# Patient Record
Sex: Male | Born: 1940 | Race: White | Hispanic: No | Marital: Married | Smoking: Former smoker
Health system: Southern US, Community
[De-identification: ages and names within clinical notes are randomized; demographics above are authoritative.]

## PROBLEM LIST (undated history)

## (undated) DIAGNOSIS — H409 Unspecified glaucoma: Secondary | ICD-10-CM

## (undated) DIAGNOSIS — Z974 Presence of external hearing-aid: Secondary | ICD-10-CM

## (undated) DIAGNOSIS — T4145XA Adverse effect of unspecified anesthetic, initial encounter: Secondary | ICD-10-CM

## (undated) DIAGNOSIS — N189 Chronic kidney disease, unspecified: Secondary | ICD-10-CM

## (undated) DIAGNOSIS — I10 Essential (primary) hypertension: Secondary | ICD-10-CM

## (undated) DIAGNOSIS — E785 Hyperlipidemia, unspecified: Secondary | ICD-10-CM

## (undated) DIAGNOSIS — Z973 Presence of spectacles and contact lenses: Secondary | ICD-10-CM

## (undated) DIAGNOSIS — R0602 Shortness of breath: Secondary | ICD-10-CM

## (undated) DIAGNOSIS — H269 Unspecified cataract: Secondary | ICD-10-CM

## (undated) DIAGNOSIS — R413 Other amnesia: Secondary | ICD-10-CM

## (undated) DIAGNOSIS — J449 Chronic obstructive pulmonary disease, unspecified: Secondary | ICD-10-CM

## (undated) DIAGNOSIS — Z972 Presence of dental prosthetic device (complete) (partial): Secondary | ICD-10-CM

## (undated) DIAGNOSIS — I639 Cerebral infarction, unspecified: Secondary | ICD-10-CM

## (undated) DIAGNOSIS — Z86010 Personal history of colonic polyps: Secondary | ICD-10-CM

## (undated) DIAGNOSIS — C801 Malignant (primary) neoplasm, unspecified: Secondary | ICD-10-CM

## (undated) HISTORY — PX: APPENDECTOMY: SHX54

## (undated) HISTORY — DX: Essential (primary) hypertension: I10

## (undated) HISTORY — DX: Malignant (primary) neoplasm, unspecified: C80.1

## (undated) HISTORY — DX: Hyperlipidemia, unspecified: E78.5

## (undated) HISTORY — PX: TONSILLECTOMY: SUR1361

## (undated) HISTORY — PX: COLONOSCOPY: SHX174

## (undated) HISTORY — DX: Personal history of colonic polyps: Z86.010

## (undated) HISTORY — DX: Unspecified glaucoma: H40.9

## (undated) HISTORY — DX: Unspecified cataract: H26.9

## (undated) HISTORY — PX: EYE SURGERY: SHX253

## (undated) HISTORY — DX: Chronic obstructive pulmonary disease, unspecified: J44.9

---

## 1988-05-18 HISTORY — PX: KNEE SURGERY: SHX244

## 1990-05-18 HISTORY — PX: OTHER SURGICAL HISTORY: SHX169

## 2004-09-05 ENCOUNTER — Ambulatory Visit: Payer: Self-pay | Admitting: Family Medicine

## 2004-09-10 ENCOUNTER — Ambulatory Visit: Payer: Self-pay | Admitting: Family Medicine

## 2005-01-12 ENCOUNTER — Ambulatory Visit: Payer: Self-pay | Admitting: Gastroenterology

## 2005-01-27 ENCOUNTER — Ambulatory Visit: Payer: Self-pay | Admitting: Gastroenterology

## 2005-01-27 ENCOUNTER — Encounter (INDEPENDENT_AMBULATORY_CARE_PROVIDER_SITE_OTHER): Payer: Self-pay | Admitting: Specialist

## 2009-12-04 ENCOUNTER — Encounter (INDEPENDENT_AMBULATORY_CARE_PROVIDER_SITE_OTHER): Payer: Self-pay | Admitting: *Deleted

## 2009-12-05 ENCOUNTER — Encounter (INDEPENDENT_AMBULATORY_CARE_PROVIDER_SITE_OTHER): Payer: Self-pay | Admitting: *Deleted

## 2010-01-17 ENCOUNTER — Encounter (INDEPENDENT_AMBULATORY_CARE_PROVIDER_SITE_OTHER): Payer: Self-pay

## 2010-01-21 ENCOUNTER — Ambulatory Visit: Payer: Self-pay | Admitting: Internal Medicine

## 2010-01-30 ENCOUNTER — Telehealth (INDEPENDENT_AMBULATORY_CARE_PROVIDER_SITE_OTHER): Payer: Self-pay | Admitting: *Deleted

## 2010-02-05 ENCOUNTER — Ambulatory Visit: Payer: Self-pay | Admitting: Internal Medicine

## 2010-02-11 ENCOUNTER — Encounter: Payer: Self-pay | Admitting: Internal Medicine

## 2010-04-09 ENCOUNTER — Encounter (INDEPENDENT_AMBULATORY_CARE_PROVIDER_SITE_OTHER): Payer: Self-pay | Admitting: *Deleted

## 2010-04-25 ENCOUNTER — Encounter (INDEPENDENT_AMBULATORY_CARE_PROVIDER_SITE_OTHER): Payer: Self-pay | Admitting: *Deleted

## 2010-05-01 ENCOUNTER — Ambulatory Visit: Payer: Self-pay | Admitting: Internal Medicine

## 2010-05-21 ENCOUNTER — Telehealth: Payer: Self-pay | Admitting: Internal Medicine

## 2010-05-27 ENCOUNTER — Ambulatory Visit (HOSPITAL_COMMUNITY)
Admission: RE | Admit: 2010-05-27 | Discharge: 2010-05-27 | Payer: Self-pay | Source: Home / Self Care | Attending: Internal Medicine | Admitting: Internal Medicine

## 2010-05-27 ENCOUNTER — Encounter: Payer: Self-pay | Admitting: Internal Medicine

## 2010-06-01 ENCOUNTER — Encounter: Payer: Self-pay | Admitting: Internal Medicine

## 2010-06-19 NOTE — Procedures (Signed)
Summary: Colonoscopy  Patient: [name] Note: All result statuses are Final unless otherwise noted.  Tests: (1) Colonoscopy (COL)   COL Colonoscopy           DONE     [facility]     [address]     [place], [place]  [address]           COLONOSCOPY PROCEDURE REPORT           PATIENT:  Keith Gibson, Keith Gibson  MR#:  [id number]     BIRTHDATE:  06-22-1940, 69 yrs. old  GENDER:  male     ENDOSCOPIST:  Iva Boop, MD, [facility]     REF. 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  Digital rectal exam was performed and     revealed no abnormalities and normal prostate.   The LB CF-H180AL     [id number] endoscope was introduced through the anus and advanced to     the cecum, which was identified by both the appendix and ileocecal     valve, without limitations.  The quality of the prep was     excellent, using MoviPrep.  The instrument was then slowly     withdrawn as the colon was fully examined. insertion: 2:48 minutes     Withdrawal: 21:57 minutes     <<PROCEDUREIMAGES>>           FINDINGS:  A sessile polyp was found in the ascending colon. It     was 2.5 cm in size. Just distal to cecum.Polyp was snared, then     cauterized with monopolar cautery. Retrieval was successful.     Normal saline lift was used. Piecemeal polypectomy and some tip     cautery.  Two polyps were found between the IC valve and this     large polyp. They were diminutive. They were 2  mm in size.Polyps     were snared without cautery. Retrieval was successful. This was     otherwise a normal examination of the colon. Includes right colon     retroflexion.   Retroflexed views in the rectum revealed no     abnormalities.    The scope was then withdrawn from the patient     and the procedure completed.           COMPLICATIONS:  None     ENDOSCOPIC IMPRESSION:     1) 2.5 cm sessile polyp in the ascending colon - removed with     saline lift and piecemeal polypectomy. This may be a recurrence of     polyp removed in 2005.     2) 2 mm polyps (2) also removed from ascending colon.     3) Otherwise normal examination, excellent prep     4) Prior adenoma  removal 2003 and 2005     RECOMMENDATIONS:     1) No aspirin or NSAID's for 2 weeks           REPEAT EXAM:  In 3 - 6 month(s) for Colonoscopy.  To ensure     complete removal. APC will be needed (hospital case).           Iva Boop, MD, [facility]           CC:  Burnell Blanks, MD     The Patient           n.     Rosalie Doctor:   Iva Boop at 02/05/2010 11:13 AM           Gunnar Bulla, [id number]  Note: An exclamation mark (!) indicates a result that was not dispersed into the flowsheet. Document Creation Date: 02/05/2010 11:14 AM _______________________________________________________________________  (1) Order result status: Final Collection or observation date-time: 02/05/2010 10:58 Requested date-time:  Receipt date-time:  Reported date-time:  Referring Physician:   Ordering Physician: Stan Head [id number]) Specimen Source:  Source: Launa Grill Order Number: [id number] Lab site:   Appended Document: Colonoscopy     Procedures Next Due Date:    Colonoscopy: 05/2010

## 2010-06-19 NOTE — Miscellaneous (Signed)
Summary: recall colon--ch.  Clinical Lists Changes  Medications: Added new medication of MOVIPREP 100 GM  SOLR (PEG-KCL-NACL-NASULF-NA ASC-C) As per prep instructions. - Signed Rx of MOVIPREP 100 GM  SOLR (PEG-KCL-NACL-NASULF-NA ASC-C) As per prep instructions.;  #1 x 0;  Signed;  Entered by: Clide Cliff RN;  Authorized by: Iva Boop MD, FACG;  Method used: Print then Give to Patient Observations: Added new observation of ALLERGY REV: Done (05/01/2010 8:38)    Prescriptions: MOVIPREP 100 GM  SOLR (PEG-KCL-NACL-NASULF-NA ASC-C) As per prep instructions.  #1 x 0   Entered by:   Clide Cliff RN   Authorized by:   Iva Boop MD, [facility]   Signed by:   Clide Cliff RN on 05/01/2010   Method used:   Print then Give to Patient   RxID:   [id number]

## 2010-06-19 NOTE — Procedures (Signed)
Summary: Instructions for procedure/[place]  Instructions for procedure/[place]   Imported By: Sherian Rein 05/06/2010 09:39:36  _____________________________________________________________________  External Attachment:    Type:   Image     Comment:   External Document

## 2010-06-19 NOTE — Letter (Signed)
Summary: Patient Notice- Polyp Results  [facility]  [address] [place], [place] [address]   Phone: [phone]  Fax: [phone]        February 11, 2010 MRN: [id number]    Keith Gibson [address] [place], [place]  [address]    Dear Keith Gibson,  The polyps removed from your colon were adenomatous. This means that they were pre-cancerous or that  they had the potential to change into cancer over time.  In order to make sure that them large polyp was completely removed, I recommend you have a repeat colonoscopy in about 3 months. If you are not contacted by [place] at that time, please call us.  Please call us if you are having persistent problems or have questions about your condition that have not been fully answered at this time.   Sincerely,  Iva Boop MD, [facility]  This letter has been electronically signed by your physician.  Appended Document: Patient Notice- Polyp Results letter mailed

## 2010-06-19 NOTE — Procedures (Signed)
Summary: Colonoscopy  Patient: [name] Note: All result statuses are Final unless otherwise noted.  Tests: (1) Colonoscopy (COL)   COL Colonoscopy           DONE     [facility]     [address] [place], [place]  [address]           COLONOSCOPY PROCEDURE REPORT           PATIENT:  Keith, Gibson  MR#:  [id number]     BIRTHDATE:  24-Jan-1941, 69 yrs. old  GENDER:  male     ENDOSCOPIST:  Iva Boop, MD, [facility]           PROCEDURE DATE:  05/27/2010     PROCEDURE:  Colonoscopy with snare polypectomy, Colonoscopy with     tumor ablation     ASA CLASS:  Class II     INDICATIONS:  excision of known colonic polyps (2.5 cm ascending     tubulovillous adenoma removed 9/11, ? completely removed)     MEDICATIONS:   Fentanyl 50 mcg IV, Versed 5 mg           DESCRIPTION OF PROCEDURE:   After the risks benefits and     alternatives of the procedure were thoroughly explained, informed     consent was obtained.  Digital rectal exam was performed and     revealed no abnormalities and normal prostate.   The  endoscope     was introduced through the anus and advanced to the cecum, which     was identified by both the appendix and ileocecal valve, without     limitations.  The quality of the prep was excellent, using     MoviPrep.  The instrument was then slowly withdrawn as the colon     was fully examined. Withdrawal time was 21 minutes     <<PROCEDUREIMAGES>>           FINDINGS:  A sessile polyp was found in the ascending colon.     Residual polyp up to 1 cm long and 6-7 mm wide. Snared piecemeal     and biopsy forceps also used. Then ablated area with argon plasma     coagulator. Polyp was snared, then cauterized with monopolar     cautery. Retrieval was successful. snare polyp.  This was     otherwise a normal examination of the colon.   Retroflexed views     in the rectum revealed no abnormalities.    The scope was then     withdrawn from the patient and the procedure  completed.           COMPLICATIONS:  None     ENDOSCOPIC IMPRESSION:     1) Sessile polyp in the ascending colon - residual polyp.     Removed and site ablated with APC today.     2) Otherwise normal examination     3) Prior adenoma removal 2003 and 2005     RECOMMENDATIONS:     1) Hold aspirin, aspirin products, and anti-inflammatory     medication for 2 weeks.     REPEAT EXAM:  In for Colonoscopy, pending biopsy results. Likely     repeat in 1 year.           Iva Boop, MD, [facility]           CC:  The Patient and Burnell Blanks, MD  n.     eSIGNED:   Iva Boop at 05/27/2010 09:40 AM           Gunnar Bulla, [id number]  Note: An exclamation mark (!) indicates a result that was not dispersed into the flowsheet. Document Creation Date: 05/27/2010 9:41 AM _______________________________________________________________________  (1) Order result status: Final Collection or observation date-time: 05/27/2010 09:22 Requested date-time:  Receipt date-time:  Reported date-time:  Referring Physician:   Ordering Physician: Stan Head [id number]) Specimen Source:  Source: Launa Grill Order Number: [id number] Lab site:

## 2010-06-19 NOTE — Letter (Signed)
Summary: [facility] Instructions  [facility]  [address] [place], [place] [address]   Phone: [phone]  Fax: [phone]       Keith Gibson    March 27, 1941    MRN: [id number]        Procedure Day /Date:  05/27/10  Tuesday     Arrival Time:  8:00am     Procedure Time:  9:00am     Location of Procedure:                    _ X_ [facility]                      PREPARATION FOR COLONOSCOPY WITH MOVIPREP   Starting 5 days prior to your procedure  05/22/10 do not eat nuts, seeds, popcorn, corn, beans, peas,  salads, or any raw vegetables.  Do not take any fiber supplements (e.g. Metamucil, Citrucel, and Benefiber).  THE DAY BEFORE YOUR PROCEDURE         DATE: 05/26/10   Monday  1.  Drink clear liquids the entire day-NO SOLID FOOD  2.  Do not drink anything colored red or purple.  Avoid juices with pulp.  No orange juice.  3.  Drink at least 64 oz. (8 glasses) of fluid/clear liquids during the day to prevent dehydration and help the prep work efficiently.  CLEAR LIQUIDS INCLUDE: Water Jello Ice Popsicles Tea (sugar ok, no milk/cream) Powdered fruit flavored drinks Coffee (sugar ok, no milk/cream) Gatorade Juice: apple, white grape, white cranberry  Lemonade Clear bullion, consomm, broth Carbonated beverages (any kind) Strained chicken noodle soup Hard Candy                             4.  In the morning, mix first dose of MoviPrep solution:    Empty 1 Pouch A and 1 Pouch B into the disposable container    Add lukewarm drinking water to the top line of the container. Mix to dissolve    Refrigerate (mixed solution should be used within 24 hrs)  5.  Begin drinking the prep at 5:00 p.m. The MoviPrep container is divided by 4 marks.   Every 15 minutes drink the solution down to the next mark (approximately 8 oz) until the full liter is complete.   6.  Follow completed prep with 16 oz of clear liquid of your choice (Nothing red or purple).  Continue  to drink clear liquids until bedtime.  7.  Before going to bed, mix second dose of MoviPrep solution:    Empty 1 Pouch A and 1 Pouch B into the disposable container    Add lukewarm drinking water to the top line of the container. Mix to dissolve    Refrigerate  THE DAY OF YOUR PROCEDURE      DATE: 05/27/10   DAY: Monday  Beginning at  4:00am  (5 hours before procedure):         1. Every 15 minutes, drink the solution down to the next mark (approx 8 oz) until the full liter is complete.  2. Follow completed prep with 16 oz. of clear liquid of your choice.    3. You may drink clear liquids until  Midnight the night before.   MEDICATION INSTRUCTIONS  Unless otherwise instructed, you should take regular prescription medications with a small sip of water   as early as possible the morning of  your procedure.           OTHER INSTRUCTIONS  You will need a responsible adult at least 71 years of age to accompany you and drive you home.   This person must remain in the waiting room during your procedure.  Wear loose fitting clothing that is easily removed.  Leave jewelry and other valuables at home.  However, you may wish to bring a book to read or  an iPod/MP3 player to listen to music as you wait for your procedure to start.  Remove all body piercing jewelry and leave at home.  Total time from sign-in until discharge is approximately 2-3 hours.  You should go home directly after your procedure and rest.  You can resume normal activities the  day after your procedure.  The day of your procedure you should not:   Drive   Make legal decisions   Operate machinery   Drink alcohol   Return to work  You will receive specific instructions about eating, activities and medications before you leave.    The above instructions have been reviewed and explained to me by   Clide Cliff, RN______________________    I fully understand and can verbalize these instructions  _____________________________ Date _________

## 2010-06-19 NOTE — Letter (Signed)
Summary: [facility] Instructions  [facility]  [address] [place], [place] [address]   Phone: [phone]  Fax: [phone]       CORRIGAN KRETSCHMER    1941-03-25    MRN: [id number]        Procedure Day Dorna Bloom:  Wednesday 02/05/2010     Arrival Time: 9:00 am      Procedure Time: 10:00 am     Location of Procedure:                    _x _  [facility] (4th Floor)                        PREPARATION FOR COLONOSCOPY WITH MOVIPREP   Starting 5 days prior to your procedure Friday 9/16 do not eat nuts, seeds, popcorn, corn, beans, peas,  salads, or any raw vegetables.  Do not take any fiber supplements (e.g. Metamucil, Citrucel, and Benefiber).  THE DAY BEFORE YOUR PROCEDURE         DATE: Tuesday 9/20 1.  Drink clear liquids the entire day-NO SOLID FOOD  2.  Do not drink anything colored red or purple.  Avoid juices with pulp.  No orange juice.  3.  Drink at least 64 oz. (8 glasses) of fluid/clear liquids during the day to prevent dehydration and help the prep work efficiently.  CLEAR LIQUIDS INCLUDE: Water Jello Ice Popsicles Tea (sugar ok, no milk/cream) Powdered fruit flavored drinks Coffee (sugar ok, no milk/cream) Gatorade Juice: apple, white grape, white cranberry  Lemonade Clear bullion, consomm, broth Carbonated beverages (any kind) Strained chicken noodle soup Hard Candy                             4.  In the morning, mix first dose of MoviPrep solution:    Empty 1 Pouch A and 1 Pouch B into the disposable container    Add lukewarm drinking water to the top line of the container. Mix to dissolve    Refrigerate (mixed solution should be used within 24 hrs)  5.  Begin drinking the prep at 5:00 p.m. The MoviPrep container is divided by 4 marks.   Every 15 minutes drink the solution down to the next mark (approximately 8 oz) until the full liter is complete.   6.  Follow completed prep with 16 oz of clear liquid of your choice (Nothing  red or purple).  Continue to drink clear liquids until bedtime.  7.  Before going to bed, mix second dose of MoviPrep solution:    Empty 1 Pouch A and 1 Pouch B into the disposable container    Add lukewarm drinking water to the top line of the container. Mix to dissolve    Refrigerate  THE DAY OF YOUR PROCEDURE      DATE: Wednesday 9/21  Beginning at 5:00 a.m. (5 hours before procedure):         1. Every 15 minutes, drink the solution down to the next mark (approx 8 oz) until the full liter is complete.  2. Follow completed prep with 16 oz. of clear liquid of your choice.    3. You may drink clear liquids until 8:00 am (2 HOURS BEFORE PROCEDURE).   MEDICATION INSTRUCTIONS  Unless otherwise instructed, you should take regular prescription medications with a small sip of water   as early as possible the morning of your  procedure.         OTHER INSTRUCTIONS  You will need a responsible adult at least 71 years of age to accompany you and drive you home.   This person must remain in the waiting room during your procedure.  Wear loose fitting clothing that is easily removed.  Leave jewelry and other valuables at home.  However, you may wish to bring a book to read or  an iPod/MP3 player to listen to music as you wait for your procedure to start.  Remove all body piercing jewelry and leave at home.  Total time from sign-in until discharge is approximately 2-3 hours.  You should go home directly after your procedure and rest.  You can resume normal activities the  day after your procedure.  The day of your procedure you should not:   Drive   Make legal decisions   Operate machinery   Drink alcohol   Return to work  You will receive specific instructions about eating, activities and medications before you leave.    The above instructions have been reviewed and explained to me by   Ulis Rias RN  January 21, 2010 10:13 AM     I fully understand and can  verbalize these instructions _____________________________ Date _________

## 2010-06-19 NOTE — Letter (Signed)
Summary: Colonoscopy Letter  [facility]  [address] [place], [place] [address]   Phone: [phone]  Fax: [phone]      December 04, 2009 MRN: [id number]   Keith Gibson [address] [place], [place]  [address]   Dear [name],   According to your medical record, it is time for you to schedule a Colonoscopy. The American Cancer Society recommends this procedure as a method to detect early colon cancer. Patients with a family history of colon cancer, or a personal history of colon polyps or inflammatory bowel disease are at increased risk.  This letter has beeen generated based on the recommendations made at the time of your procedure. If you feel that in your particular situation this may no longer apply, please contact our office.  Please call our office at [phone] to schedule this appointment or to update your records at your earliest convenience.  Thank you for cooperating with [place] to provide you with the very best care possible.   Sincerely,   Iva Boop, M.D.  [facility] [phone]

## 2010-06-19 NOTE — Progress Notes (Signed)
Summary: Resch'd Procedure  Phone Note Call from Patient Call back at Home Phone [phone]   Caller: Patient Call For: Dr. Leone Payor Summary of Call: pt. has a procedure sch'd at the hosp. 05-27-10 and he has the shingles. Needs to r/s his appt. Initial call taken by: Karna Christmas,  May 21, 2010 8:07 AM  Follow-up for Phone Call        Patient has a mild case of shingles.  He is not having any pain.  He is currently taking acyclovir three times a day. he would like to continue with the procedure. Is this ok? Follow-up by: Darcey Nora RN, CGRN,  May 21, 2010 9:07 AM  Additional Follow-up for Phone Call Additional follow up Details #1::        yes

## 2010-06-19 NOTE — Miscellaneous (Signed)
Summary: Lec previsit  Clinical Lists Changes  Medications: Added new medication of MOVIPREP 100 GM  SOLR (PEG-KCL-NACL-NASULF-NA ASC-C) As per prep instructions. - Signed Added new medication of MOVIPREP 100 GM  SOLR (PEG-KCL-NACL-NASULF-NA ASC-C) As per prep instructions. - Signed Rx of MOVIPREP 100 GM  SOLR (PEG-KCL-NACL-NASULF-NA ASC-C) As per prep instructions.;  #1 x 0;  Signed;  Entered by: Ulis Rias RN;  Authorized by: Iva Boop MD, FACG;  Method used: Print then Give to Patient Rx of MOVIPREP 100 GM  SOLR (PEG-KCL-NACL-NASULF-NA ASC-C) As per prep instructions.;  #1 x 0;  Signed;  Entered by: Ulis Rias RN;  Authorized by: Iva Boop MD, FACG;  Method used: Print then Give to Patient Observations: Added new observation of NKA: T (01/21/2010 9:37)    Prescriptions: MOVIPREP 100 GM  SOLR (PEG-KCL-NACL-NASULF-NA ASC-C) As per prep instructions.  #1 x 0   Entered by:   Ulis Rias RN   Authorized by:   Iva Boop MD, [place]   Signed by:   Ulis Rias RN on 01/21/2010   Method used:   Print then Give to Patient   RxID:   [id number] MOVIPREP 100 GM  SOLR (PEG-KCL-NACL-NASULF-NA ASC-C) As per prep instructions.  #1 x 0   Entered by:   Ulis Rias RN   Authorized by:   Iva Boop MD, [facility]   Signed by:   Ulis Rias RN on 01/21/2010   Method used:   Print then Give to Patient   RxID:   [id number]

## 2010-06-19 NOTE — Letter (Signed)
Summary: Pre Visit Letter Revised  [facility]  [address], [place] [address]   Phone: [phone]  Fax: [phone]        04/09/2010 MRN: [id number] Keith Gibson [address] [place], [place]  [address]             Procedure Date:  05-15-10   Welcome to the Gastroenterology Division at [facility].    You are scheduled to see a nurse for your pre-procedure visit on 05-01-10 at 9:00a.m. on the 3rd floor at [facility], [address]  We ask that you try to arrive at our office 15 minutes prior to your appointment time to allow for check-in.  Please take a minute to review the attached form.  If you answer "Yes" to one or more of the questions on the first page, we ask that you call the person listed at your earliest opportunity.  If you answer "No" to all of the questions, please complete the rest of the form and bring it to your appointment.    Your nurse visit will consist of discussing your medical and surgical history, your immediate family medical history, and your medications.   If you are unable to list all of your medications on the form, please bring the medication bottles to your appointment and we will list them.  We will need to be aware of both prescribed and over the counter drugs.  We will need to know exact dosage information as well.    Please be prepared to read and sign documents such as consent forms, a financial agreement, and acknowledgement forms.  If necessary, and with your consent, a friend or relative is welcome to sit-in on the nurse visit with you.  Please bring your insurance card so that we may make a copy of it.  If your insurance requires a referral to see a specialist, please bring your referral form from your primary care physician.  No co-pay is required for this nurse visit.     If you cannot keep your appointment, please call [phone] to cancel or reschedule prior to your appointment date.  This allows [place] the  opportunity to schedule an appointment for another patient in need of care.    Thank you for choosing  Gastroenterology for your medical needs.  We appreciate the opportunity to care for you.  Please visit [place] at our website  to learn more about our practice.  Sincerely, The Gastroenterology Division

## 2010-06-19 NOTE — Letter (Signed)
Summary: Previsit letter  [facility]  [address] [place], [place] [address]   Phone: [phone]  Fax: [phone]       12/05/2009 MRN: [id number]  Keith Gibson [address] [place], [place]  [address]  Dear Keith Gibson,  Welcome to the Gastroenterology Division at [facility].    You are scheduled to see a nurse for your pre-procedure visit on 01-21-10 at 10:00a.m. on the 3rd floor at [facility], [address]  We ask that you try to arrive at our office 15 minutes prior to your appointment time to allow for check-in.  Your nurse visit will consist of discussing your medical and surgical history, your immediate family medical history, and your medications.    Please bring a complete list of all your medications or, if you prefer, bring the medication bottles and we will list them.  We will need to be aware of both prescribed and over the counter drugs.  We will need to know exact dosage information as well.  If you are on blood thinners (Coumadin, Plavix, Aggrenox, Ticlid, etc.) please call our office today/prior to your appointment, as we need to consult with your physician about holding your medication.   Please be prepared to read and sign documents such as consent forms, a financial agreement, and acknowledgement forms.  If necessary, and with your consent, a friend or relative is welcome to sit-in on the nurse visit with you.  Please bring your insurance card so that we may make a copy of it.  If your insurance requires a referral to see a specialist, please bring your referral form from your primary care physician.  No co-pay is required for this nurse visit.     If you cannot keep your appointment, please call [phone] to cancel or reschedule prior to your appointment date.  This allows [place] the opportunity to schedule an appointment for another patient in need of care.    Thank you for choosing [facility] for your medical needs.  We  appreciate the opportunity to care for you.  Please visit [place] at our website  to learn more about our practice.                     Sincerely.                                                                                                                   The Gastroenterology Division

## 2010-06-19 NOTE — Letter (Signed)
Summary: Patient Notice- Polyp Results  [facility]  [address] [place], [place] [address]   Phone: [phone]  Fax: [phone]        June 01, 2010 MRN: [id number]    Keith Gibson [address] [place], [place]  [address]    Dear [name],  I am pleased to inform you that the colon polyp removed during your recent colonoscopy was found to be benign (no cancer detected) upon pathologic examination.  I recommend you have a repeat colonoscopy examination in 1 years to look for recurrent polyps, as having colon polyps increases your risk for having recurrent polyps or even colon cancer in the future.  Should you develop new or worsening symptoms of abdominal pain, bowel habit changes or bleeding from the rectum or bowels, please schedule an evaluation with either your primary care physician or with me.  Please call us if you are having persistent problems or have questions about your condition that have not been fully answered at this time.  Sincerely,  Iva Boop MD, [facility]  This letter has been electronically signed by your physician.  Appended Document: Patient Notice- Polyp Results letter mailed to patient's home

## 2010-06-19 NOTE — Progress Notes (Signed)
Summary: speak to nurse  Phone Note Call from Patient Call back at Home Phone [phone]   Caller: Patient Call For: Dr Leone Payor Reason for Call: Talk to Nurse Summary of Call: Patient wants to speak to nurse Initial call taken by: Tawni Levy,  January 30, 2010 1:11 PM  Follow-up for Phone Call        Pt's questions about diet were answered. Follow-up by: Ezra Sites RN,  January 30, 2010 2:03 PM

## 2012-02-04 ENCOUNTER — Encounter: Payer: Self-pay | Admitting: Internal Medicine

## 2012-02-08 ENCOUNTER — Encounter: Payer: Self-pay | Admitting: Internal Medicine

## 2012-03-08 ENCOUNTER — Ambulatory Visit (AMBULATORY_SURGERY_CENTER): Payer: Medicare Other

## 2012-03-08 VITALS — Ht 67.0 in | Wt 171.0 lb

## 2012-03-08 DIAGNOSIS — Z86010 Personal history of colonic polyps: Secondary | ICD-10-CM

## 2012-03-08 DIAGNOSIS — Z1211 Encounter for screening for malignant neoplasm of colon: Secondary | ICD-10-CM

## 2012-03-08 MED ORDER — NA SULFATE-K SULFATE-MG SULF 17.5-3.13-1.6 GM/177ML PO SOLN: 1.0000 | Freq: Once | ORAL | Status: DC

## 2012-03-22 ENCOUNTER — Encounter: Payer: Self-pay | Admitting: Internal Medicine

## 2012-03-22 ENCOUNTER — Ambulatory Visit (AMBULATORY_SURGERY_CENTER): Payer: Medicare Other | Admitting: Internal Medicine

## 2012-03-22 VITALS — BP 142/87 | HR 65 | Temp 97.6°F | Resp 19 | Ht 67.0 in | Wt 171.0 lb

## 2012-03-22 DIAGNOSIS — Z860100 Personal history of colon polyps, unspecified: Secondary | ICD-10-CM

## 2012-03-22 DIAGNOSIS — D126 Benign neoplasm of colon, unspecified: Secondary | ICD-10-CM

## 2012-03-22 DIAGNOSIS — Z1211 Encounter for screening for malignant neoplasm of colon: Secondary | ICD-10-CM

## 2012-03-22 HISTORY — DX: Personal history of colonic polyps: Z86.010

## 2012-03-22 HISTORY — DX: Personal history of colon polyps, unspecified: Z86.0100

## 2012-03-22 MED ORDER — SODIUM CHLORIDE 0.9 % IV SOLN: 500.0000 mL | INTRAVENOUS | Status: DC

## 2012-03-22 NOTE — Op Note (Signed)
[facility] [address] [place], [address]   COLONOSCOPY PROCEDURE REPORT  PATIENT: Keith, Gibson  MR#: [id number] BIRTHDATE: [birth year] , 71  yrs. old GENDER: Male ENDOSCOPIST: Iva Boop, MD, [facility] PROCEDURE DATE:  03/22/2012 PROCEDURE:   Colonoscopy with biopsy ASA CLASS:   Class III INDICATIONS:patient's personal history of adenomatous colon polyps.  MEDICATIONS: Propofol (Diprivan) 270 mg IV, MAC sedation, administered by CRNA, and These medications were titrated to patient response per physician's verbal order  DESCRIPTION OF PROCEDURE:   After the risks benefits and alternatives of the procedure were thoroughly explained, informed consent was obtained.  A digital rectal exam revealed no abnormalities of the rectum and A digital rectal exam revealed the prostate was not enlarged.   The LB [id number]-[id number]  endoscope was introduced through the anus and advanced to the cecum, which was identified by both the appendix and ileocecal valve. No adverse events experienced.   The quality of the prep was Suprep excellent The instrument was then slowly withdrawn as the colon was fully examined.      COLON FINDINGS: A diminutive polypoid shaped sessile polyp was found in the ascending colon.  A polypectomy was performed with cold forceps.  The resection was complete and the polyp tissue was completely retrieved.   Small internal hemorrhoids were found. The colon mucosa was otherwise normal.  Retroflexed views revealed internal hemorrhoids. The time to cecum=2 minutes 08 seconds. Withdrawal time=11 minutes 45 seconds.  The scope was withdrawn and the procedure completed. COMPLICATIONS: There were no complications.  ENDOSCOPIC IMPRESSION: 1.   Diminutive sessile polyp was found in the ascending colon; polypectomy was performed with cold forceps 2.   Small internal hemorrhoids 3.   The colon mucosa was otherwise normal - excellent prep 4.    Personal  hx adenomas since 2003, large TV adenoma ascending colon last removed 05/2010 appears gone RECOMMENDATIONS: Timing of repeat colonoscopy will be determined by pathology findings.   eSigned:  Iva Boop, MD, [facility] 03/22/2012 12:41 PM   cc: Burnell Blanks, MD and The Patient

## 2012-03-22 NOTE — Progress Notes (Signed)
Pt brought inhaler to center. One puff from Spiriva inhaler administered by S. Camp, Scientist, clinical (histocompatibility and immunogenetics)

## 2012-03-22 NOTE — Progress Notes (Addendum)
Patient did not have preoperative order for IV antibiotic SSI prophylaxis. (G8918)  Patient did not experience any of the following events: a burn prior to discharge; a fall within the facility; wrong site/side/patient/procedure/implant event; or a hospital transfer or hospital admission upon discharge from the facility. (G8907)  

## 2012-03-22 NOTE — Patient Instructions (Addendum)
One tiny polyp was removed. The large polyp appears to be gone. I will let you know the results and recommendations by mail.  Thank you for choosing me and [facility].  Iva Boop, MD, FACG  YOU HAD AN ENDOSCOPIC PROCEDURE TODAY AT THE [facility]: Refer to the procedure report that was given to you for any specific questions about what was found during the examination.  If the procedure report does not answer your questions, please call your gastroenterologist to clarify.  If you requested that your care partner not be given the details of your procedure findings, then the procedure report has been included in a sealed envelope for you to review at your convenience later.  YOU SHOULD EXPECT: Some feelings of bloating in the abdomen. Passage of more gas than usual.  Walking can help get rid of the air that was put into your GI tract during the procedure and reduce the bloating. If you had a lower endoscopy (such as a colonoscopy or flexible sigmoidoscopy) you may notice spotting of blood in your stool or on the toilet paper. If you underwent a bowel prep for your procedure, then you may not have a normal bowel movement for a few days.  DIET: Your first meal following the procedure should be a light meal and then it is ok to progress to your normal diet.  A half-sandwich or bowl of soup is an example of a good first meal.  Heavy or fried foods are harder to digest and may make you feel nauseous or bloated.  Likewise meals heavy in dairy and vegetables can cause extra gas to form and this can also increase the bloating.  Drink plenty of fluids but you should avoid alcoholic beverages for 24 hours.  ACTIVITY: Your care partner should take you home directly after the procedure.  You should plan to take it easy, moving slowly for the rest of the day.  You can resume normal activity the day after the procedure however you should NOT DRIVE or use heavy machinery for 24 hours  (because of the sedation medicines used during the test).    SYMPTOMS TO REPORT IMMEDIATELY: A gastroenterologist can be reached at any hour.  During normal business hours, 8:30 AM to 5:00 PM Monday through Friday, call [phone].  After hours and on weekends, please call the GI answering service at [phone] who will take a message and have the physician on call contact you.   Following lower endoscopy (colonoscopy or flexible sigmoidoscopy):  Excessive amounts of blood in the stool  Significant tenderness or worsening of abdominal pains  Swelling of the abdomen that is new, acute  Fever of 100F or higher     FOLLOW UP: If any biopsies were taken you will be contacted by phone or by letter within the next 1-3 weeks.  Call your gastroenterologist if you have not heard about the biopsies in 3 weeks.  Our staff will call the home number listed on your records the next business day following your procedure to check on you and address any questions or concerns that you may have at that time regarding the information given to you following your procedure. This is a courtesy call and so if there is no answer at the home number and we have not heard from you through the emergency physician on call, we will assume that you have returned to your regular daily activities without incident.  SIGNATURES/CONFIDENTIALITY: You and/or your care partner have  signed paperwork which will be entered into your electronic medical record.  These signatures attest to the fact that that the information above on your After Visit Summary has been reviewed and is understood.  Full responsibility of the confidentiality of this discharge information lies with you and/or your care-partner. e    Polyp information given.

## 2012-03-23 ENCOUNTER — Telehealth: Payer: Self-pay | Admitting: *Deleted

## 2012-03-23 NOTE — Telephone Encounter (Signed)
  Follow up Call-  Call back number 03/22/2012  Post procedure Call Back phone  # [phone]  Permission to leave phone message Yes     Patient questions:  Do you have a fever, pain , or abdominal swelling? no Pain Score  0 *  Have you tolerated food without any problems? yes  Have you been able to return to your normal activities? yes  Do you have any questions about your discharge instructions: Diet   no Medications  no Follow up visit  no  Do you have questions or concerns about your Care? no  Actions: * If pain score is 4 or above: No action needed, pain <4.

## 2012-03-28 ENCOUNTER — Encounter: Payer: Self-pay | Admitting: Internal Medicine

## 2012-03-28 NOTE — Progress Notes (Signed)
Quick Note:  Diminutive adenoma Repeat colonoscopy 2018 ______

## 2012-08-25 ENCOUNTER — Encounter (HOSPITAL_BASED_OUTPATIENT_CLINIC_OR_DEPARTMENT_OTHER): Payer: Self-pay | Admitting: *Deleted

## 2012-08-25 NOTE — Progress Notes (Signed)
States mild copd-used to use spiriva-did not help-no problems now-no inhalers-has never seen cardiology or resp MD To come for ekg and bmet To bring all meds and overnight bag in case he needs to stay overnight

## 2012-08-25 NOTE — Progress Notes (Signed)
08/25/12 1109  OBSTRUCTIVE SLEEP APNEA  Have you ever been diagnosed with sleep apnea through a sleep study? No  Do you snore loudly (loud enough to be heard through closed doors)?  1  Do you often feel tired, fatigued, or sleepy during the daytime? 0  Has anyone observed you stop breathing during your sleep? 0  Do you have, or are you being treated for high blood pressure? 1  BMI more than 35 kg/m2? 0  Age over 72 years old? 1  Gender: 1  Obstructive Sleep Apnea Score 4  Score 4 or greater  Results sent to PCP

## 2012-08-26 ENCOUNTER — Encounter (HOSPITAL_BASED_OUTPATIENT_CLINIC_OR_DEPARTMENT_OTHER)
Admission: RE | Admit: 2012-08-26 | Discharge: 2012-08-26 | Disposition: A | Discharge status: Home / Self Care | Payer: Medicare Other | Source: Ambulatory Visit | Attending: Orthopedic Surgery | Admitting: Orthopedic Surgery

## 2012-08-26 ENCOUNTER — Other Ambulatory Visit: Payer: Self-pay | Admitting: Orthopedic Surgery

## 2012-08-26 LAB — BASIC METABOLIC PANEL
BUN: 17 mg/dL (ref 6–23)
Chloride: 101 mEq/L (ref 96–112)
GFR calc Af Amer: 60 mL/min — ABNORMAL LOW (ref >90)
GFR calc non Af Amer: 52 mL/min — ABNORMAL LOW (ref >90)
Glucose, Bld: 92 mg/dL (ref 70–99)
Potassium: 5.2 mEq/L — ABNORMAL HIGH (ref 3.5–5.1)
Sodium: 136 mEq/L (ref 135–145)

## 2012-08-26 NOTE — Pre-Procedure Instructions (Signed)
Dr. Chaney Malling informed of K+ of 5.2 - will repeat DOS

## 2012-08-29 NOTE — H&P (Signed)
Keith Gibson is an 72 y.o. male.   Chief Complaint: c/o chronic and progressive right shoulder pain HPI: Keith Gibson is a 72 year-old retired gentleman who remains very active at home.  He is accompanied by his wife during our consult.  He sustained a significant injury to his right shoulder in an injury episode that occurred on his front porch in August of 2013.  He had pain, difficulty with range of motion, weakness and challenges with overhead activity or reaching behind his back.  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  He has age appropriate changes on his x-rays and degenerative change at the [facility] joint.  Keith Gibson ordered a MRI of the shoulder that was accomplished on 12/22/11.  This was interpreted by the Keith Gibson to reveal partial tearing of the biceps tendon at the bicipital groove, tenosynovitis, interarticular tendinosis of the biceps tendon, and supraspinatus tendinosis with a 5 mm.  intrasubstance PASTA tear, subscapularis tendonitis with fraying of its insertion, AC degenerative arthritis and degenerative tearing of the superior labrum.  Keith Gibson provided a steroid injection and recommended exercise.  Overall, Keith Gibson has improved, but continues to have weakness of abduction, external rotation, difficulty reaching behind his back and episodic pain when he tries to sleep on the shoulder at night.  He seeks an upper extremity orthopaedic consult for second opinion and possible treatment.    Past Medical History  Diagnosis Date  . Hyperlipidemia   . Hypertension   . COPD (chronic obstructive pulmonary disease)   . 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 Personal history of adenomatous  colonic polyps 03/22/2012    Adenomas 2003 and 2005 01/2010 2.5 cm TV adenoma of cecum removed 05/2010 - residual TV adenoma removal   . Shortness of breath   . Wears glasses   .  Wears dentures     full top-partial bottom  . Wears hearing aid     both ears    Past Surgical History  Procedure Laterality Date  . Knee surgery  1990    arthroscopic on right knee  . Colonoscopy    . Vocal cord surg  1992    removed precancerous polyps  . Tonsillectomy    . Appendectomy    . Eye surgery      lazer    History reviewed. No pertinent family history. Social History:  reports that he quit smoking about 15 years ago. His smoking use included Cigarettes. He smoked 0.00 packs per day. He has never used smokeless tobacco. He reports that he drinks about 8.4 ounces of alcohol per week. His drug history is not on file.  Allergies: No Known Allergies  No prescriptions prior to admission    No results found for this or any previous visit (from the past 48 hour(s)).  No results found.   Pertinent items are noted in HPI.  Height 5\' 6"  (1.676 m), weight 81.647 kg (180 lb).  General appearance: alert Head: Normocephalic, without obvious abnormality Neck: supple, symmetrical, trachea midline Resp: clear to auscultation bilaterally Cardio: regular rate and rhythm GI: normal findings: bowel sounds normal Extremities:. His shoulder range of motion reveals combined elevation right shoulder 165, left shoulder 170, external rotation 90 degrees abduction right shoulder 80, left shoulder 80, internal rotation right shoulder L-1, left shoulder T-10.  He lacks extension to the interscapular plane 20 degrees  bilaterally.  He has a painful push-off test with weakness on the right, negative push-off test on the left. He has a markedly painful Speed's test on the right, mild on the left.  He has weakness of scaption abduction.   His plain films of the shoulder AP internal and external rotation views and outlet view are reviewed.  He has AC degenerative change with widening of the [facility] joint.  He has a prominent anterior medial acromion and anterolateral acromion.  He has a well preserved  glenohumeral joint space.   His MRI is reviewed in detail. He has a PASTA tear that is retracted about 5-6 mm., he has subscapularis tendinopathy and biceps tendinopathy.   Pulses: 2+ and symmetric Skin: normal Neurologic: Grossly normal    Assessment/Plan Impression:Right shoulder RC tear.   Plan:to OR for right SA with SAD/DCR with RC repair.We had a detailed discussion regarding his predicament.  He has a high mileage 72 year-old shoulder.  As he wants to pursue an active lifestyle he is a proper candidate to consider arthroscopic evaluation, SAD/DCR and repair of the rotator cuff.    Keith Gibson 08/29/2012, 4:27 PM   H&P documentation: 08/30/2012  -History and Physical Reviewed  -Patient has been re-examined  -No change in the plan of care  Keith Forster, MD

## 2012-08-30 ENCOUNTER — Ambulatory Visit (HOSPITAL_BASED_OUTPATIENT_CLINIC_OR_DEPARTMENT_OTHER): Payer: Medicare Other | Admitting: Anesthesiology

## 2012-08-30 ENCOUNTER — Encounter (HOSPITAL_BASED_OUTPATIENT_CLINIC_OR_DEPARTMENT_OTHER)
Admission: RE | Disposition: A | Discharge status: Home / Self Care | Payer: Self-pay | Source: Ambulatory Visit | Attending: Orthopedic Surgery

## 2012-08-30 ENCOUNTER — Ambulatory Visit (HOSPITAL_BASED_OUTPATIENT_CLINIC_OR_DEPARTMENT_OTHER)
Admission: RE | Admit: 2012-08-30 | Discharge: 2012-08-31 | Disposition: A | Discharge status: Home / Self Care | Payer: Medicare Other | Source: Ambulatory Visit | Attending: Orthopedic Surgery | Admitting: Orthopedic Surgery

## 2012-08-30 ENCOUNTER — Encounter (HOSPITAL_BASED_OUTPATIENT_CLINIC_OR_DEPARTMENT_OTHER): Payer: Self-pay | Admitting: Anesthesiology

## 2012-08-30 ENCOUNTER — Encounter (HOSPITAL_BASED_OUTPATIENT_CLINIC_OR_DEPARTMENT_OTHER): Payer: Self-pay | Admitting: *Deleted

## 2012-08-30 DIAGNOSIS — S43429A Sprain of unspecified rotator cuff capsule, initial encounter: Secondary | ICD-10-CM | POA: Insufficient documentation

## 2012-08-30 DIAGNOSIS — M25819 Other specified joint disorders, unspecified shoulder: Secondary | ICD-10-CM | POA: Insufficient documentation

## 2012-08-30 DIAGNOSIS — M19019 Primary osteoarthritis, unspecified shoulder: Secondary | ICD-10-CM | POA: Insufficient documentation

## 2012-08-30 DIAGNOSIS — X58XXXA Exposure to other specified factors, initial encounter: Secondary | ICD-10-CM | POA: Insufficient documentation

## 2012-08-30 DIAGNOSIS — Z87891 Personal history of nicotine dependence: Secondary | ICD-10-CM | POA: Insufficient documentation

## 2012-08-30 HISTORY — DX: Presence of dental prosthetic device (complete) (partial): Z97.2

## 2012-08-30 HISTORY — DX: Shortness of breath: R06.02

## 2012-08-30 HISTORY — DX: Presence of external hearing-aid: Z97.4

## 2012-08-30 HISTORY — PX: SHOULDER ARTHROSCOPY WITH ROTATOR CUFF REPAIR AND SUBACROMIAL DECOMPRESSION: SHX5686

## 2012-08-30 HISTORY — DX: Presence of spectacles and contact lenses: Z97.3

## 2012-08-30 LAB — POCT I-STAT, CHEM 8
BUN: 12 mg/dL (ref 6–23)
Creatinine, Ser: 1.1 mg/dL (ref 0.50–1.35)
Glucose, Bld: 88 mg/dL (ref 70–99)
Hemoglobin: 18.4 g/dL — ABNORMAL HIGH (ref 13.0–17.0)
Potassium: 4.2 mEq/L (ref 3.5–5.1)
TCO2: 28 mmol/L (ref 0–100)

## 2012-08-30 SURGERY — SHOULDER ARTHROSCOPY WITH ROTATOR CUFF REPAIR AND SUBACROMIAL DECOMPRESSION
Anesthesia: General | Site: Shoulder | Laterality: Right | Wound class: Clean

## 2012-08-30 MED ORDER — SODIUM CHLORIDE 0.9 % IR SOLN
Status: DC | PRN
  Administered 2012-08-30: 51000 mL

## 2012-08-30 MED ORDER — EPHEDRINE SULFATE 50 MG/ML IJ SOLN
INTRAMUSCULAR | Status: DC | PRN
  Administered 2012-08-30: 10 mg via INTRAVENOUS

## 2012-08-30 MED ORDER — ONDANSETRON HCL 4 MG/2ML IJ SOLN
INTRAMUSCULAR | Status: DC | PRN
  Administered 2012-08-30: 4 mg via INTRAVENOUS

## 2012-08-30 MED ORDER — CEFAZOLIN SODIUM 1-5 GM-% IV SOLN
1.0000 g | Freq: Four times a day (QID) | INTRAVENOUS | Status: AC
  Administered 2012-08-30 – 2012-08-31 (×3): 1 g via INTRAVENOUS

## 2012-08-30 MED ORDER — ONDANSETRON HCL 4 MG PO TABS: 4.0000 mg | ORAL_TABLET | Freq: Four times a day (QID) | ORAL | Status: DC | PRN

## 2012-08-30 MED ORDER — ONDANSETRON HCL 4 MG/2ML IJ SOLN: 4.0000 mg | Freq: Four times a day (QID) | INTRAMUSCULAR | Status: DC | PRN

## 2012-08-30 MED ORDER — OXYCODONE HCL 5 MG PO TABS: 5.0000 mg | ORAL_TABLET | Freq: Once | ORAL | Status: DC | PRN

## 2012-08-30 MED ORDER — CEFAZOLIN SODIUM-DEXTROSE 2-3 GM-% IV SOLR
2.0000 g | INTRAVENOUS | Status: AC
  Administered 2012-08-30: 2 g via INTRAVENOUS

## 2012-08-30 MED ORDER — MIDAZOLAM HCL 2 MG/2ML IJ SOLN
1.0000 mg | INTRAMUSCULAR | Status: DC | PRN
  Administered 2012-08-30: 2 mg via INTRAVENOUS

## 2012-08-30 MED ORDER — LACTATED RINGERS IV SOLN
INTRAVENOUS | Status: DC
  Administered 2012-08-30: 15:00:00 via INTRAVENOUS

## 2012-08-30 MED ORDER — LACTATED RINGERS IV SOLN
INTRAVENOUS | Status: DC
  Administered 2012-08-30 (×2): via INTRAVENOUS

## 2012-08-30 MED ORDER — FENTANYL CITRATE 0.05 MG/ML IJ SOLN
50.0000 ug | Freq: Once | INTRAMUSCULAR | Status: AC
  Administered 2012-08-30: 100 ug via INTRAVENOUS

## 2012-08-30 MED ORDER — CEPHALEXIN 500 MG PO CAPS: 500.0000 mg | ORAL_CAPSULE | Freq: Three times a day (TID) | ORAL | Status: DC

## 2012-08-30 MED ORDER — LACTATED RINGERS IV SOLN: 20.0000 mL | INTRAVENOUS | Status: DC

## 2012-08-30 MED ORDER — HYDROMORPHONE HCL 2 MG PO TABS: ORAL_TABLET | ORAL | Status: DC

## 2012-08-30 MED ORDER — FENTANYL CITRATE 0.05 MG/ML IJ SOLN: 25.0000 ug | INTRAMUSCULAR | Status: DC | PRN

## 2012-08-30 MED ORDER — OXYCODONE HCL 5 MG/5ML PO SOLN: 5.0000 mg | Freq: Once | ORAL | Status: DC | PRN

## 2012-08-30 MED ORDER — METOCLOPRAMIDE HCL 5 MG PO TABS: 5.0000 mg | ORAL_TABLET | Freq: Three times a day (TID) | ORAL | Status: DC | PRN

## 2012-08-30 MED ORDER — CHLORHEXIDINE GLUCONATE 4 % EX LIQD
60.0000 mL | Freq: Once | CUTANEOUS | Status: AC
  Administered 2012-08-30: 4 via TOPICAL

## 2012-08-30 MED ORDER — DEXAMETHASONE SODIUM PHOSPHATE 4 MG/ML IJ SOLN
INTRAMUSCULAR | Status: DC | PRN
  Administered 2012-08-30: 5 mg via INTRAVENOUS

## 2012-08-30 MED ORDER — DEXAMETHASONE SODIUM PHOSPHATE 4 MG/ML IJ SOLN
INTRAMUSCULAR | Status: DC | PRN
  Administered 2012-08-30: 4 mg

## 2012-08-30 MED ORDER — METOCLOPRAMIDE HCL 5 MG/ML IJ SOLN: 5.0000 mg | Freq: Three times a day (TID) | INTRAMUSCULAR | Status: DC | PRN

## 2012-08-30 MED ORDER — LIDOCAINE HCL (CARDIAC) 20 MG/ML IV SOLN
INTRAVENOUS | Status: DC | PRN
  Administered 2012-08-30: 50 mg via INTRAVENOUS

## 2012-08-30 MED ORDER — PROPOFOL 10 MG/ML IV BOLUS
INTRAVENOUS | Status: DC | PRN
  Administered 2012-08-30: 150 mg via INTRAVENOUS

## 2012-08-30 MED ORDER — SUCCINYLCHOLINE CHLORIDE 20 MG/ML IJ SOLN
INTRAMUSCULAR | Status: DC | PRN
  Administered 2012-08-30: 100 mg via INTRAVENOUS

## 2012-08-30 MED ORDER — HYDROMORPHONE HCL 4 MG PO TABS
4.0000 mg | ORAL_TABLET | ORAL | Status: DC | PRN
  Administered 2012-08-31 (×2): 4 mg via ORAL

## 2012-08-30 MED ORDER — HYDROMORPHONE HCL PF 1 MG/ML IJ SOLN: 0.5000 mg | INTRAMUSCULAR | Status: DC | PRN

## 2012-08-30 MED ORDER — ROPIVACAINE HCL 5 MG/ML IJ SOLN
INTRAMUSCULAR | Status: DC | PRN
  Administered 2012-08-30: 25 mL

## 2012-08-30 MED ORDER — LIDOCAINE HCL 4 % MT SOLN
OROMUCOSAL | Status: DC | PRN
  Administered 2012-08-30: 4 mL via TOPICAL

## 2012-08-30 SURGICAL SUPPLY — 81 items
ANCH SUT SWLK 19.1 CLS EYLT TL (Anchor) ×1 IMPLANT
ANCH SUT SWLK 19.1X4.75 (Anchor) ×3 IMPLANT
ANCHOR SUT BIO SW 4.75 W/FIB (Anchor) ×2 IMPLANT
ANCHOR SUT BIO SW 4.75X19.1 (Anchor) ×6 IMPLANT
BANDAGE ADHESIVE 1X3 (GAUZE/BANDAGES/DRESSINGS) IMPLANT
BLADE AVERAGE 25X9 (BLADE) IMPLANT
BLADE CUTTER MENIS 5.5 (BLADE) ×2 IMPLANT
BLADE SURG 15 STRL LF DISP TIS (BLADE) ×2 IMPLANT
BLADE SURG 15 STRL SS (BLADE) ×4
BUR EGG 3PK/BX (BURR) IMPLANT
BUR OVAL 6.0 (BURR) ×2 IMPLANT
CANISTER OMNI JUG 16 LITER (MISCELLANEOUS) ×4 IMPLANT
CANISTER SUCTION 2500CC (MISCELLANEOUS) ×8 IMPLANT
CANNULA TWIST IN 8.25X7CM (CANNULA) ×2 IMPLANT
CLEANER CAUTERY TIP 5X5 PAD (MISCELLANEOUS) IMPLANT
CLOTH BEACON ORANGE TIMEOUT ST (SAFETY) ×2 IMPLANT
CUTTER MENISCUS  4.2MM (BLADE) ×1
CUTTER MENISCUS 4.2MM (BLADE) ×1 IMPLANT
DECANTER SPIKE VIAL GLASS SM (MISCELLANEOUS) IMPLANT
DRAPE INCISE IOBAN 66X45 STRL (DRAPES) ×2 IMPLANT
DRAPE STERI 35X30 U-POUCH (DRAPES) ×2 IMPLANT
DRAPE SURG 17X23 STRL (DRAPES) ×2 IMPLANT
DRAPE U-SHAPE 47X51 STRL (DRAPES) ×2 IMPLANT
DRAPE U-SHAPE 76X120 STRL (DRAPES) ×4 IMPLANT
DRSG PAD ABDOMINAL 8X10 ST (GAUZE/BANDAGES/DRESSINGS) ×2 IMPLANT
DURAPREP 26ML APPLICATOR (WOUND CARE) ×2 IMPLANT
ELECT REM PT RETURN 9FT ADLT (ELECTROSURGICAL)
ELECTRODE REM PT RTRN 9FT ADLT (ELECTROSURGICAL) IMPLANT
GLOVE BIO SURGEON STRL SZ 6.5 (GLOVE) ×4 IMPLANT
GLOVE BIOGEL M STRL SZ7.5 (GLOVE) ×2 IMPLANT
GLOVE BIOGEL PI IND STRL 7.0 (GLOVE) ×1 IMPLANT
GLOVE BIOGEL PI IND STRL 8 (GLOVE) ×2 IMPLANT
GLOVE BIOGEL PI INDICATOR 7.0 (GLOVE) ×1
GLOVE BIOGEL PI INDICATOR 8 (GLOVE) ×2
GLOVE ECLIPSE 6.5 STRL STRAW (GLOVE) ×2 IMPLANT
GLOVE ORTHO TXT STRL SZ7.5 (GLOVE) ×2 IMPLANT
GOWN BRE IMP PREV XXLGXLNG (GOWN DISPOSABLE) ×6 IMPLANT
GOWN STRL REIN 2XL XLG LVL4 (GOWN DISPOSABLE) ×4 IMPLANT
NDL SUT 6 .5 CRC .975X.05 MAYO (NEEDLE) IMPLANT
NEEDLE MAYO TAPER (NEEDLE)
NEEDLE MINI RC 24MM (NEEDLE) IMPLANT
NEEDLE SCORPION (NEEDLE) ×2 IMPLANT
PACK ARTHROSCOPY DSU (CUSTOM PROCEDURE TRAY) ×2 IMPLANT
PACK BASIN DAY SURGERY FS (CUSTOM PROCEDURE TRAY) ×2 IMPLANT
PAD CLEANER CAUTERY TIP 5X5 (MISCELLANEOUS)
PASSER SUT SWANSON 36MM LOOP (INSTRUMENTS) IMPLANT
PENCIL BUTTON HOLSTER BLD 10FT (ELECTRODE) IMPLANT
SLEEVE SCD COMPRESS KNEE MED (MISCELLANEOUS) ×2 IMPLANT
SLING ARM FOAM STRAP LRG (SOFTGOODS) ×2 IMPLANT
SLING ARM FOAM STRAP MED (SOFTGOODS) IMPLANT
SPONGE GAUZE 4X4 12PLY (GAUZE/BANDAGES/DRESSINGS) ×2 IMPLANT
SPONGE LAP 4X18 X RAY DECT (DISPOSABLE) ×2 IMPLANT
STRIP CLOSURE SKIN 1/2X4 (GAUZE/BANDAGES/DRESSINGS) IMPLANT
SUCTION FRAZIER TIP 10 FR DISP (SUCTIONS) IMPLANT
SUT ETHIBOND 2 OS 4 DA (SUTURE) IMPLANT
SUT ETHILON 4 0 PS 2 18 (SUTURE) IMPLANT
SUT FIBERWIRE #2 38 T-5 BLUE (SUTURE)
SUT FIBERWIRE 3-0 18 TAPR NDL (SUTURE)
SUT PROLENE 1 CT (SUTURE) IMPLANT
SUT PROLENE 3 0 PS 2 (SUTURE) ×2 IMPLANT
SUT TIGER TAPE 7 IN WHITE (SUTURE) ×4 IMPLANT
SUT VIC AB 0 CT1 27 (SUTURE)
SUT VIC AB 0 CT1 27XBRD ANBCTR (SUTURE) IMPLANT
SUT VIC AB 0 SH 27 (SUTURE) IMPLANT
SUT VIC AB 2-0 SH 27 (SUTURE) ×2
SUT VIC AB 2-0 SH 27XBRD (SUTURE) ×1 IMPLANT
SUT VIC AB 3-0 SH 27 (SUTURE)
SUT VIC AB 3-0 SH 27X BRD (SUTURE) IMPLANT
SUT VIC AB 3-0 X1 27 (SUTURE) IMPLANT
SUTURE FIBERWR #2 38 T-5 BLUE (SUTURE) IMPLANT
SUTURE FIBERWR 3-0 18 TAPR NDL (SUTURE) IMPLANT
SYR 3ML 23GX1 SAFETY (SYRINGE) IMPLANT
SYR BULB 3OZ (MISCELLANEOUS) IMPLANT
TAPE FIBER 2MM 7IN #2 BLUE (SUTURE) IMPLANT
TAPE PAPER 3X10 WHT MICROPORE (GAUZE/BANDAGES/DRESSINGS) ×2 IMPLANT
TOWEL OR 17X24 6PK STRL BLUE (TOWEL DISPOSABLE) ×2 IMPLANT
TUBE CONNECTING 20X1/4 (TUBING) ×6 IMPLANT
TUBING ARTHROSCOPY IRRIG 16FT (MISCELLANEOUS) ×2 IMPLANT
WAND STAR VAC 90 (SURGICAL WAND) ×2 IMPLANT
WATER STERILE IRR 1000ML POUR (IV SOLUTION) ×2 IMPLANT
YANKAUER SUCT BULB TIP NO VENT (SUCTIONS) IMPLANT

## 2012-08-30 NOTE — Anesthesia Preprocedure Evaluation (Signed)
Anesthesia Evaluation  Patient identified by MRN, date of birth, ID band Patient awake    Reviewed: Allergy & Precautions, H&P , NPO status , Patient's Chart, lab work & pertinent test results  Airway Mallampati: I TM Distance: >3 FB Neck ROM: Full    Dental   Pulmonary shortness of breath, COPD + rhonchi   + decreased breath sounds      Cardiovascular hypertension, Rhythm:Regular Rate:Normal     Neuro/Psych    GI/Hepatic   Endo/Other    Renal/GU      Musculoskeletal   Abdominal   Peds  Hematology   Anesthesia Other Findings   Reproductive/Obstetrics                           Anesthesia Physical Anesthesia Plan  ASA: III  Anesthesia Plan: General   Post-op Pain Management:    Induction: Intravenous  Airway Management Planned: Oral ETT  Additional Equipment:   Intra-op Plan:   Post-operative Plan: Extubation in OR  Informed Consent: I have reviewed the patients History and Physical, chart, labs and discussed the procedure including the risks, benefits and alternatives for the proposed anesthesia with the patient or authorized representative who has indicated his/her understanding and acceptance.     Plan Discussed with: CRNA and Surgeon  Anesthesia Plan Comments:         Anesthesia Quick Evaluation

## 2012-08-30 NOTE — Anesthesia Postprocedure Evaluation (Signed)
  Anesthesia Post-op Note  Patient: Keith Gibson  Procedure(s) Performed: Procedure(s): RIGHT SHOULDER ARTHROSCOPY WITH SUBACROMIAL DECOMPRESSION, DISTAL CLAVICLE RESECTION AND ROTATOR CUFF REPAIR (Right)  Patient Location: PACU  Anesthesia Type:GA combined with regional for post-op pain  Level of Consciousness: awake, alert , oriented and patient cooperative  Airway and Oxygen Therapy: Patient Spontanous Breathing and Patient connected to nasal cannula oxygen  Post-op Pain: none  Post-op Assessment: Post-op Vital signs reviewed, Patient's Cardiovascular Status Stable, Respiratory Function Stable, Patent Airway, No signs of Nausea or vomiting and Pain level controlled  Post-op Vital Signs: Reviewed and stable  Complications: No apparent anesthesia complications

## 2012-08-30 NOTE — Op Note (Signed)
[id number] 

## 2012-08-30 NOTE — Brief Op Note (Signed)
08/30/2012  1:29 PM  PATIENT:  Keith Gibson  72 y.o. 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  PROCEDURE:  Procedure(s): RIGHT SHOULDER ARTHROSCOPY WITH SUBACROMIAL DECOMPRESSION, DISTAL CLAVICLE RESECTION AND ROTATOR CUFF REPAIR (Right)  SURGEON:  Surgeon(s) and Role:    * Wyn Forster., MD - Primary  PHYSICIAN ASSISTANT:   ASSISTANTS:Laurine Kuyper Dasnoit,P.A-C    ANESTHESIA:   general  EBL:  Total I/O In: 1200 [I.V.:1200] Out: -   BLOOD ADMINISTERED:none  DRAINS: none   LOCAL MEDICATIONS USED: ropivacaine plexus block  SPECIMEN:  No Specimen  DISPOSITION OF SPECIMEN:  N/A  COUNTS:  YES  TOURNIQUET:  * No tourniquets in log *  DICTATION: .Other Dictation: Dictation Number [id number]  PLAN OF CARE: Admit for overnight observation  PATIENT DISPOSITION:  PACU - hemodynamically stable.   Delay start of Pharmacological VTE agent (>24hrs) due to surgical blood loss or risk of bleeding: not applicable

## 2012-08-30 NOTE — Transfer of Care (Signed)
Immediate Anesthesia Transfer of Care Note  Patient: Keith Gibson  Procedure(s) Performed: Procedure(s): RIGHT SHOULDER ARTHROSCOPY WITH SUBACROMIAL DECOMPRESSION, DISTAL CLAVICLE RESECTION AND ROTATOR CUFF REPAIR (Right)  Patient Location: PACU  Anesthesia Type:GA combined with regional for post-op pain  Level of Consciousness: sedated  Airway & Oxygen Therapy: Patient Spontanous Breathing and Patient connected to face mask oxygen  Post-op Assessment: Report given to PACU RN and Post -op Vital signs reviewed and stable  Post vital signs: Reviewed and stable  Complications: No apparent anesthesia complications

## 2012-08-30 NOTE — Progress Notes (Signed)
Assisted Dr. Kasik with right, ultrasound guided, interscalene  block. Side rails up, monitors on throughout procedure. See vital signs in flow sheet. Tolerated Procedure well. 

## 2012-08-30 NOTE — Anesthesia Procedure Notes (Addendum)
Anesthesia Regional Block:  Interscalene brachial plexus block  Pre-Anesthetic Checklist: ,, timeout performed, Correct Patient, Correct Site, Correct Laterality, Correct Procedure, Correct Position, site marked, Risks and benefits discussed,  Surgical consent,  Pre-op evaluation,  At surgeon's request and post-op pain management  Laterality: Right  Prep: chloraprep       Needles:  Injection technique: Single-shot  Needle Type: Echogenic Stimulator Needle     Needle Length: 5cm 5 cm Needle Gauge: 22 and 22 G    Additional Needles:  Procedures: ultrasound guided (picture in chart) and nerve stimulator Interscalene brachial plexus block  Nerve Stimulator or Paresthesia:  Response: 0.48 mA,   Additional Responses:   Narrative:  Start time: 08/30/2012 10:10 AM End time: 08/30/2012 10:21 AM Injection made incrementally with aspirations every 3 mL. Anesthesiologist: Dr Gypsy Balsam  Additional Notes: 1010-1021 R ISB POP CHG prep, sterile tech #22 stim needle w/stim down to .48ma and good [place] visualization-PIX in chart Ropivicain .5% 25cc+decadron 4mg  infiltrated Multiple neg asp No compl Dr Gypsy Balsam   Procedure Name: Intubation Date/Time: 08/30/2012 11:05 AM Performed by: Burna Cash Pre-anesthesia Checklist: Patient identified, Emergency Drugs available, Suction available and Patient being monitored Patient Re-evaluated:Patient Re-evaluated prior to inductionOxygen Delivery Method: Circle System Utilized Preoxygenation: Pre-oxygenation with 100% oxygen Intubation Type: IV induction Ventilation: Mask ventilation without difficulty Laryngoscope Size: Mac and 3 Grade View: Grade I Tube type: Oral Tube size: 8.0 mm Number of attempts: 1 Airway Equipment and Method: stylet and oral airway Placement Confirmation: ETT inserted through vocal cords under direct vision,  positive ETCO2 and breath sounds checked- equal and bilateral Secured at: 23 cm Tube secured with:  Tape Dental Injury: Teeth and Oropharynx as per pre-operative assessment

## 2012-09-01 ENCOUNTER — Encounter (HOSPITAL_BASED_OUTPATIENT_CLINIC_OR_DEPARTMENT_OTHER): Payer: Self-pay | Admitting: Orthopedic Surgery

## 2012-09-01 NOTE — Op Note (Signed)
[name], [name] NO.:  [id number]  MEDICAL RECORD NO.:  [id number]  LOCATION:                                 FACILITY:  PHYSICIAN:  Katy Fitch. Bennie Chirico, M.D. 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  POSTOPERATIVE DIAGNOSIS:  Extensive adhesive capsulitis, grade 2 tear of subscapularis, 80% pasta tear of supraspinatus and partial infraspinatus, rupture of long head of biceps with retained stump in glenohumeral joint, and significant labral degenerative changes.  OPERATIONS: 1. Diagnostic arthroscopy, right shoulder with extensive debridement     of labrum, adhesive capsulitis, and stump of ruptured long head of     biceps. 2. Arthroscopic subacromial decompression with bursectomy,     coracoacromial ligament release and acromioplasty. 3. Arthroscopic repair of grade 2 subscapularis, rotator cuff tear to     decorticated lesser tuberosity. 4. Arthroscopic repair of supraspinatus, infraspinatus rotator cuff     tendons with double row technique utilizing a medial swivel lock,     and a posterior reverse mattress suture to lateral swivel locks x2.  OPERATING SURGEON:  Katy Fitch. Shamaya Kauer, M.D.  ASSISTANT:  Jonni Sanger, P.A.  ANESTHESIA:  General by endotracheal technique supplemented by a right ropivacaine plexus block.  SUPERVISING ANESTHESIOLOGIST:  Bedelia Person, M.D.  INDICATIONS:  Keith Gibson is a 72 year old gentleman, referred through the courtesy of Dr. Burnell Blanks of [place], [place], for evaluation and management of a painful right shoulder.   Clinical examination revealed signs of rotator cuff tear, AC arthropathy and possible adhesive capsulitis.  Plain x-rays documented advanced degenerative change at the [facility] joint and unfavorable acromial and distal clavicle anatomy.  An MRI was obtained which documented the presence of a significant partial thickness articular sided tear of the supraspinatus, infraspinatus, and a grade 2 tear of the subscapularis.  DESCRIPTION OF PROCEDURE:  After detailed informed consent, Mr. Keith Gibson decided that he would like to have his rotator cuff repaired due to the fact that he wants to maintain a very active lifestyle.  We recommended proceeding with reconstruction of his rotator cuff at this time.  After informed consent with Dr. Gypsy Balsam, he had a ropivacaine plexus block Placed in the holding area leading to excellent anesthesia of the right arm and forequarter. After proper surgical site identification protocol was followed, the right shoulder was marked and he was transferred to room 2 of the [facility] for surgery.  In room 2 under Dr. Burnett Corrente direct supervision, general anesthesia by endotracheal technique was induced followed by careful position in the beach-chair position with the aid of a torso and head holder designed for shoulder arthroscopy.  The entire right arm and forequarter were prepped with DuraPrep and draped with impervious arthroscopy drapes.  The shoulder was marked in the usual manner.  Following routine surgical time-out, procedure commenced with  placing of the arthroscope through a standard posterior viewing portal with an anterior switching stick technique.  Diagnostic arthroscopy revealed abundant granulation tissue within the glenohumeral joint, a rupture of the long head of the biceps with a long stump hanging within the joint and labral degenerative changes.  There was extensive degenerative changes of the rotator cuff tendons and an 80% pasta tear of the  supraspinatus and part of the infraspinatus anteriorly.  An anterior portal was created and the suction shaver was used to debride the stump of the biceps and the labrum as well as all granulation tissue from the adhesive capsulitis predicament and hemostasis was achieved with bipolar cautery.  The subscapularis was repaired after placing an anterolateral, superior portal with a clear cannula followed by decortication of the lesser tuberosity, and placement of a reverse mattress suture with a fiber tape to a swivel lock brought in by an anterior cannula.  Anatomic repair of the subscapularis was achieved.  A portion of the supraspinatus tear was then completed with the arthroscopic bipolar cautery wand and the scope was then replaced in the subacromial space.  After bursectomy was accomplished, hemostasis was achieved with the bipolar cautery followed by leveling the acromion to a type 1 morphology and release of coracoacromial ligament with the cutting cautery.  The coracoacromial ligament was electrocauterized for hemostasis and the capsule of the [facility] joint was documented to be ruptured with inferior hanging osteophytes of the distal clavicle.  The distal centimeter of the clavicle was removed arthroscopically with removal of capsular remnants with a suction shaver.  Hemostasis was achieved with bipolar cautery. The acromion was leveled medially and anteriorly to a type 1 morphology and bursectomy and release of the deltoid fascia was accomplished to facilitate arthroscopic repair of the rotator cuff.  With the aid of a lateral cannula and proper suture management, we placed an anterior swivel lock with a #2 FiberWire mattress suture anteriorly and a fiber tape to a lateral swivel lock.  The posterior aspect of the tear was managed with a reverse mattress suture to a lateral swivel lock.  A #2 FiberWire was placed with mattress technique and tied with a knot pusher, creating a very  satisfactory footprint at the site of the repair on the greater tuberosity.  After the repair was completed, the tails of the arthroscopic, fiber tape, and sutures were trimmed with the arthroscopic suture cutter followed by placement of the scope in the glenohumeral joint, confirming a very satisfactory footprint of repair.  We had an anatomic repair of the subscapularis and an excellent restoration of the footprint of the supraspinatus anteriorly with the FiberWire mattress suture.  We achieved satisfactory compression of the repair to the greater tuberosity with the fiber tapes.  After hemostasis was achieved, the arthroscope was removed from the glenohumeral joint and the portals repaired with an interrupted suture of 3-0 Vicryl and intradermal 3-0 Prolene.  The portals were dressed with sterile gauze and paper tape.  Mr. Heidenreich was awakened from general anesthesia and will be transferred to the recovery room with stable vital signs.  We will admit him for 23-hour observation for pain control, monitoring of his pulmonary status due to some background COPD and appropriate analgesics.  He will be provided Ancef 1 g IV q.6 hours x3 doses.     Katy Fitch Millicent Blazejewski, M.D.     RVS/MEDQ  D:  08/30/2012  T:  08/31/2012  Job:  [id number]  cc:   Burnell Blanks, MD

## 2012-09-26 NOTE — Addendum Note (Signed)
Addendum created 09/26/12 1305 by Bedelia Person, MD   Modules edited: Anesthesia Responsible Staff

## 2012-12-21 ENCOUNTER — Other Ambulatory Visit: Payer: Self-pay

## 2013-03-23 ENCOUNTER — Other Ambulatory Visit: Payer: Self-pay

## 2014-04-29 ENCOUNTER — Encounter (HOSPITAL_BASED_OUTPATIENT_CLINIC_OR_DEPARTMENT_OTHER): Payer: Medicare Other

## 2014-05-08 HISTORY — PX: CAROTID ENDARTERECTOMY: SUR193

## 2014-07-24 DIAGNOSIS — Z9849 Cataract extraction status, unspecified eye: Secondary | ICD-10-CM | POA: Diagnosis not present

## 2014-07-24 DIAGNOSIS — H4011X2 Primary open-angle glaucoma, moderate stage: Secondary | ICD-10-CM | POA: Diagnosis not present

## 2014-08-14 DIAGNOSIS — E780 Pure hypercholesterolemia: Secondary | ICD-10-CM | POA: Diagnosis not present

## 2014-08-14 DIAGNOSIS — N183 Chronic kidney disease, stage 3 (moderate): Secondary | ICD-10-CM | POA: Diagnosis not present

## 2014-08-21 DIAGNOSIS — I6521 Occlusion and stenosis of right carotid artery: Secondary | ICD-10-CM | POA: Diagnosis not present

## 2014-08-21 DIAGNOSIS — Z8673 Personal history of transient ischemic attack (TIA), and cerebral infarction without residual deficits: Secondary | ICD-10-CM | POA: Diagnosis not present

## 2014-08-21 DIAGNOSIS — Z974 Presence of external hearing-aid: Secondary | ICD-10-CM | POA: Diagnosis not present

## 2014-08-21 DIAGNOSIS — Z9889 Other specified postprocedural states: Secondary | ICD-10-CM | POA: Diagnosis not present

## 2014-08-21 DIAGNOSIS — Z09 Encounter for follow-up examination after completed treatment for conditions other than malignant neoplasm: Secondary | ICD-10-CM | POA: Diagnosis not present

## 2014-08-21 DIAGNOSIS — Z7982 Long term (current) use of aspirin: Secondary | ICD-10-CM | POA: Diagnosis not present

## 2014-08-21 DIAGNOSIS — Z8679 Personal history of other diseases of the circulatory system: Secondary | ICD-10-CM | POA: Diagnosis not present

## 2014-08-21 DIAGNOSIS — H9193 Unspecified hearing loss, bilateral: Secondary | ICD-10-CM | POA: Diagnosis not present

## 2014-08-23 DIAGNOSIS — Z9181 History of falling: Secondary | ICD-10-CM | POA: Diagnosis not present

## 2014-08-23 DIAGNOSIS — Z1389 Encounter for screening for other disorder: Secondary | ICD-10-CM | POA: Diagnosis not present

## 2014-08-23 DIAGNOSIS — E780 Pure hypercholesterolemia: Secondary | ICD-10-CM | POA: Diagnosis not present

## 2014-08-23 DIAGNOSIS — I10 Essential (primary) hypertension: Secondary | ICD-10-CM | POA: Diagnosis not present

## 2014-08-23 DIAGNOSIS — N182 Chronic kidney disease, stage 2 (mild): Secondary | ICD-10-CM | POA: Diagnosis not present

## 2014-08-23 DIAGNOSIS — J449 Chronic obstructive pulmonary disease, unspecified: Secondary | ICD-10-CM | POA: Diagnosis not present

## 2014-10-22 ENCOUNTER — Encounter: Payer: Self-pay | Admitting: Gastroenterology

## 2014-10-22 ENCOUNTER — Encounter: Payer: Self-pay | Admitting: Internal Medicine

## 2014-10-24 DIAGNOSIS — H4011X2 Primary open-angle glaucoma, moderate stage: Secondary | ICD-10-CM | POA: Diagnosis not present

## 2014-10-24 DIAGNOSIS — H04123 Dry eye syndrome of bilateral lacrimal glands: Secondary | ICD-10-CM | POA: Diagnosis not present

## 2014-11-08 DIAGNOSIS — Z1389 Encounter for screening for other disorder: Secondary | ICD-10-CM | POA: Diagnosis not present

## 2014-11-08 DIAGNOSIS — Z9181 History of falling: Secondary | ICD-10-CM | POA: Diagnosis not present

## 2014-11-08 DIAGNOSIS — R42 Dizziness and giddiness: Secondary | ICD-10-CM | POA: Diagnosis not present

## 2014-11-08 DIAGNOSIS — Z6824 Body mass index (BMI) 24.0-24.9, adult: Secondary | ICD-10-CM | POA: Diagnosis not present

## 2014-11-08 DIAGNOSIS — R634 Abnormal weight loss: Secondary | ICD-10-CM | POA: Diagnosis not present

## 2014-12-05 DIAGNOSIS — L820 Inflamed seborrheic keratosis: Secondary | ICD-10-CM | POA: Diagnosis not present

## 2015-02-08 DIAGNOSIS — Z6824 Body mass index (BMI) 24.0-24.9, adult: Secondary | ICD-10-CM | POA: Diagnosis not present

## 2015-02-08 DIAGNOSIS — J069 Acute upper respiratory infection, unspecified: Secondary | ICD-10-CM | POA: Diagnosis not present

## 2015-02-27 DIAGNOSIS — H401131 Primary open-angle glaucoma, bilateral, mild stage: Secondary | ICD-10-CM | POA: Diagnosis not present

## 2015-02-27 DIAGNOSIS — Z9849 Cataract extraction status, unspecified eye: Secondary | ICD-10-CM | POA: Diagnosis not present

## 2015-03-01 DIAGNOSIS — Z1389 Encounter for screening for other disorder: Secondary | ICD-10-CM | POA: Diagnosis not present

## 2015-03-01 DIAGNOSIS — Z125 Encounter for screening for malignant neoplasm of prostate: Secondary | ICD-10-CM | POA: Diagnosis not present

## 2015-03-01 DIAGNOSIS — I10 Essential (primary) hypertension: Secondary | ICD-10-CM | POA: Diagnosis not present

## 2015-03-01 DIAGNOSIS — N182 Chronic kidney disease, stage 2 (mild): Secondary | ICD-10-CM | POA: Diagnosis not present

## 2015-03-01 DIAGNOSIS — J449 Chronic obstructive pulmonary disease, unspecified: Secondary | ICD-10-CM | POA: Diagnosis not present

## 2015-03-01 DIAGNOSIS — Z139 Encounter for screening, unspecified: Secondary | ICD-10-CM | POA: Diagnosis not present

## 2015-03-01 DIAGNOSIS — Z23 Encounter for immunization: Secondary | ICD-10-CM | POA: Diagnosis not present

## 2015-03-01 DIAGNOSIS — E7800 Pure hypercholesterolemia, unspecified: Secondary | ICD-10-CM | POA: Diagnosis not present

## 2015-03-18 DIAGNOSIS — M9901 Segmental and somatic dysfunction of cervical region: Secondary | ICD-10-CM | POA: Diagnosis not present

## 2015-03-18 DIAGNOSIS — M9902 Segmental and somatic dysfunction of thoracic region: Secondary | ICD-10-CM | POA: Diagnosis not present

## 2015-03-18 DIAGNOSIS — M50322 Other cervical disc degeneration at C5-C6 level: Secondary | ICD-10-CM | POA: Diagnosis not present

## 2015-05-22 DIAGNOSIS — M545 Low back pain: Secondary | ICD-10-CM | POA: Diagnosis not present

## 2015-05-22 DIAGNOSIS — N451 Epididymitis: Secondary | ICD-10-CM | POA: Diagnosis not present

## 2015-06-07 DIAGNOSIS — H401131 Primary open-angle glaucoma, bilateral, mild stage: Secondary | ICD-10-CM | POA: Diagnosis not present

## 2015-06-07 DIAGNOSIS — H18413 Arcus senilis, bilateral: Secondary | ICD-10-CM | POA: Diagnosis not present

## 2015-06-24 DIAGNOSIS — Z961 Presence of intraocular lens: Secondary | ICD-10-CM | POA: Diagnosis not present

## 2015-06-24 DIAGNOSIS — H401131 Primary open-angle glaucoma, bilateral, mild stage: Secondary | ICD-10-CM | POA: Diagnosis not present

## 2015-08-08 DIAGNOSIS — H1132 Conjunctival hemorrhage, left eye: Secondary | ICD-10-CM | POA: Diagnosis not present

## 2015-08-08 DIAGNOSIS — Z961 Presence of intraocular lens: Secondary | ICD-10-CM | POA: Diagnosis not present

## 2015-08-29 DIAGNOSIS — L821 Other seborrheic keratosis: Secondary | ICD-10-CM | POA: Diagnosis not present

## 2015-08-29 DIAGNOSIS — L820 Inflamed seborrheic keratosis: Secondary | ICD-10-CM | POA: Diagnosis not present

## 2015-09-03 DIAGNOSIS — R413 Other amnesia: Secondary | ICD-10-CM | POA: Diagnosis not present

## 2015-09-03 DIAGNOSIS — I10 Essential (primary) hypertension: Secondary | ICD-10-CM | POA: Diagnosis not present

## 2015-09-03 DIAGNOSIS — J449 Chronic obstructive pulmonary disease, unspecified: Secondary | ICD-10-CM | POA: Diagnosis not present

## 2015-09-03 DIAGNOSIS — N182 Chronic kidney disease, stage 2 (mild): Secondary | ICD-10-CM | POA: Diagnosis not present

## 2015-09-03 DIAGNOSIS — E7800 Pure hypercholesterolemia, unspecified: Secondary | ICD-10-CM | POA: Diagnosis not present

## 2015-09-16 DIAGNOSIS — H18413 Arcus senilis, bilateral: Secondary | ICD-10-CM | POA: Diagnosis not present

## 2015-09-16 DIAGNOSIS — H11423 Conjunctival edema, bilateral: Secondary | ICD-10-CM | POA: Diagnosis not present

## 2015-09-16 DIAGNOSIS — H401132 Primary open-angle glaucoma, bilateral, moderate stage: Secondary | ICD-10-CM | POA: Diagnosis not present

## 2015-09-16 DIAGNOSIS — H0011 Chalazion right upper eyelid: Secondary | ICD-10-CM | POA: Diagnosis not present

## 2015-09-16 DIAGNOSIS — H0014 Chalazion left upper eyelid: Secondary | ICD-10-CM | POA: Diagnosis not present

## 2015-09-26 DIAGNOSIS — I6521 Occlusion and stenosis of right carotid artery: Secondary | ICD-10-CM | POA: Diagnosis not present

## 2015-09-26 DIAGNOSIS — Z8673 Personal history of transient ischemic attack (TIA), and cerebral infarction without residual deficits: Secondary | ICD-10-CM | POA: Diagnosis not present

## 2015-09-26 DIAGNOSIS — I6523 Occlusion and stenosis of bilateral carotid arteries: Secondary | ICD-10-CM | POA: Diagnosis not present

## 2015-09-26 DIAGNOSIS — Z48812 Encounter for surgical aftercare following surgery on the circulatory system: Secondary | ICD-10-CM | POA: Diagnosis not present

## 2015-09-26 DIAGNOSIS — Z9889 Other specified postprocedural states: Secondary | ICD-10-CM | POA: Diagnosis not present

## 2015-10-01 DIAGNOSIS — G3184 Mild cognitive impairment, so stated: Secondary | ICD-10-CM | POA: Diagnosis not present

## 2015-10-01 DIAGNOSIS — E663 Overweight: Secondary | ICD-10-CM | POA: Diagnosis not present

## 2015-10-29 DIAGNOSIS — H401132 Primary open-angle glaucoma, bilateral, moderate stage: Secondary | ICD-10-CM | POA: Diagnosis not present

## 2015-10-29 DIAGNOSIS — Z961 Presence of intraocular lens: Secondary | ICD-10-CM | POA: Diagnosis not present

## 2015-11-21 DIAGNOSIS — M79674 Pain in right toe(s): Secondary | ICD-10-CM | POA: Diagnosis not present

## 2015-11-21 DIAGNOSIS — Z6824 Body mass index (BMI) 24.0-24.9, adult: Secondary | ICD-10-CM | POA: Diagnosis not present

## 2015-12-09 DIAGNOSIS — L820 Inflamed seborrheic keratosis: Secondary | ICD-10-CM | POA: Diagnosis not present

## 2015-12-12 DIAGNOSIS — M898X9 Other specified disorders of bone, unspecified site: Secondary | ICD-10-CM | POA: Diagnosis not present

## 2015-12-12 DIAGNOSIS — M2041 Other hammer toe(s) (acquired), right foot: Secondary | ICD-10-CM | POA: Diagnosis not present

## 2016-01-08 DIAGNOSIS — N182 Chronic kidney disease, stage 2 (mild): Secondary | ICD-10-CM | POA: Diagnosis not present

## 2016-01-08 DIAGNOSIS — I10 Essential (primary) hypertension: Secondary | ICD-10-CM | POA: Diagnosis not present

## 2016-01-08 DIAGNOSIS — E7800 Pure hypercholesterolemia, unspecified: Secondary | ICD-10-CM | POA: Diagnosis not present

## 2016-01-08 DIAGNOSIS — R1033 Periumbilical pain: Secondary | ICD-10-CM | POA: Diagnosis not present

## 2016-01-16 DIAGNOSIS — I700 Atherosclerosis of aorta: Secondary | ICD-10-CM | POA: Diagnosis not present

## 2016-01-16 DIAGNOSIS — R1033 Periumbilical pain: Secondary | ICD-10-CM | POA: Diagnosis not present

## 2016-01-16 DIAGNOSIS — N200 Calculus of kidney: Secondary | ICD-10-CM | POA: Diagnosis not present

## 2016-01-23 ENCOUNTER — Telehealth: Payer: Self-pay | Admitting: Internal Medicine

## 2016-01-23 DIAGNOSIS — D225 Melanocytic nevi of trunk: Secondary | ICD-10-CM | POA: Diagnosis not present

## 2016-01-23 DIAGNOSIS — L814 Other melanin hyperpigmentation: Secondary | ICD-10-CM | POA: Diagnosis not present

## 2016-01-23 DIAGNOSIS — L821 Other seborrheic keratosis: Secondary | ICD-10-CM | POA: Diagnosis not present

## 2016-01-23 NOTE — Telephone Encounter (Signed)
I contacted office and asked for records for review

## 2016-01-24 NOTE — Telephone Encounter (Signed)
appt scheduled for Wed 01/29/16 1:30 with Ellouise Newer, PA message left for patient to call and confirm appt .

## 2016-01-27 NOTE — Telephone Encounter (Signed)
Patient confirmed appointment.

## 2016-01-29 ENCOUNTER — Other Ambulatory Visit (INDEPENDENT_AMBULATORY_CARE_PROVIDER_SITE_OTHER): Payer: Medicare Other

## 2016-01-29 ENCOUNTER — Encounter: Payer: Self-pay | Admitting: Physician Assistant

## 2016-01-29 ENCOUNTER — Ambulatory Visit (INDEPENDENT_AMBULATORY_CARE_PROVIDER_SITE_OTHER): Payer: Medicare Other | Admitting: Physician Assistant

## 2016-01-29 VITALS — BP 102/60 | HR 76 | Ht 66.0 in | Wt 150.0 lb

## 2016-01-29 DIAGNOSIS — R1084 Generalized abdominal pain: Secondary | ICD-10-CM

## 2016-01-29 DIAGNOSIS — R634 Abnormal weight loss: Secondary | ICD-10-CM

## 2016-01-29 DIAGNOSIS — R140 Abdominal distension (gaseous): Secondary | ICD-10-CM

## 2016-01-29 DIAGNOSIS — R194 Change in bowel habit: Secondary | ICD-10-CM

## 2016-01-29 LAB — LIPASE: Lipase: 113 U/L — ABNORMAL HIGH (ref 11.0–59.0)

## 2016-01-29 NOTE — Patient Instructions (Signed)
You have been scheduled for an endoscopy and colonoscopy. Please follow the written instructions given to you at your visit today. Please pick up your prep supplies at the pharmacy within the next 1-3 days. If you use inhalers (even only as needed), please bring them with you on the day of your procedure. Your physician has requested that you go to www.startemmi.com and enter the access code given to you at your visit today. This web site gives a general overview about your procedure. However, you should still follow specific instructions given to you by our office regarding your preparation for the procedure.  Your physician has requested that you go to the basement for the following lab work before leaving today: Lipase

## 2016-01-29 NOTE — Progress Notes (Signed)
Chief Complaint: Weight loss, abdominal pain  HPI:  Keith Gibson is a 75 year old male, with past medical history significant for throat cancer, COPD, hyperlipidemia, hypertension and history of adenomatous colon polyps, who was referred to me by Hamrick, Lorin Mercy, MD for a complaint of abdominal pain and weight loss .  The patient has followed with Dr. Carlean Gibson in the past.    Recent labs completed on 01/08/16 show an elevated creatinine at 1.36 and an otherwise normal CMP and CBC. Lipase was elevated at 83. CT of the abdomen and pelvis completed on 01/16/16 revealed no explanation for the patient's and periumbilical pain, coronary artery calcifications and significant abdominal aortic atherosclerosis.   According to our records patient has lost around 20 pounds since April 2014.   Today, the patient explains that for the past year he has been losing around 2 pounds per month according to his primary care provider without trying. He also describes that within the past 2 months he has developed a periumbilical/epigastric abdominal pain which is dull and constant and worsened while he eats. Patient describes this as a "tightness/bloating", it makes him feel like he has to "rush to the bathroom, but then nothing comes out". Patient also describes a change in bowel habits over this time telling me that they have changed from one solid bowel movement per day to now "loose/chocolate and runny". Patient has also had a few episodes of constipation "here and there", which have required laxatives. He does not take these on a regular basis. Patient denies any change in medications or diet. Patient also describes that his appetite has changed, some things "just don't look good to me anymore and I used to be able to eat anything".  Patient denies fever, chills, heartburn, reflux, dysphagia, melena or hematochezia.  Past Medical History:  Diagnosis Date  . 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 COPD (chronic  obstructive pulmonary disease)   . Hyperlipidemia   . Hypertension   . 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 Shortness of breath   . Wears dentures    full top-partial bottom  . Wears glasses   . Wears hearing aid    both ears    Past Surgical History:  Procedure Laterality Date  . APPENDECTOMY    . COLONOSCOPY    . EYE SURGERY     [facility]  . KNEE SURGERY  1990   arthroscopic on right knee  . SHOULDER ARTHROSCOPY WITH ROTATOR CUFF REPAIR AND SUBACROMIAL DECOMPRESSION Right 08/30/2012   Procedure: RIGHT SHOULDER ARTHROSCOPY WITH SUBACROMIAL DECOMPRESSION, DISTAL CLAVICLE RESECTION AND ROTATOR CUFF REPAIR;  Surgeon: Keith Gibson., MD;  Location: [facility];  Service: Orthopedics;  Laterality: Right;  . TONSILLECTOMY    . vocal cord surg  1992   removed precancerous polyps    Current Outpatient Prescriptions  Medication Sig Dispense Refill  . aspirin 81 MG tablet Take 81 mg by mouth daily.    Marland Kitchen atorvastatin (LIPITOR) 20 MG tablet Take 20 mg by mouth daily.    . Multiple Vitamins-Minerals (OCUVITE ADULT 50+ PO) Take by mouth daily.    . Omega-3 Fatty Acids (FISH OIL) 1000 MG CAPS Take by mouth daily.     No current facility-administered medications for this visit.     Allergies as of 01/29/2016  . (No Known Allergies)  No family history on file.  Social History   Social History  . Marital status: Married    Spouse name: N/A  . Number of children: N/A  . Years of education: N/A   Occupational History  . Not on file.   Social History Main Topics  . Smoking status: Former Smoker    Types: Cigarettes    Quit date: 03/08/1997  . Smokeless tobacco: Never Used  . Alcohol use 8.4 oz/week    14 Cans of beer per week     Comment: occ  . Drug use: Unknown  . Sexual activity: Not on file   Other Topics Concern  . Not on  file   Social History Narrative  . No narrative on file    Review of Systems:     Constitutional: As above for a weight loss of around 12 pounds this year No ever, chills, weakness or fatigue HEENT: Eyes: No change in vision               Ears, Nose, Throat:  No change in hearing or congestion Skin: No rash or itching Cardiovascular: No chest pain, chest pressure or palpitations   Respiratory: No SOB or cough Gastrointestinal: See HPI and otherwise negative Genitourinary: No dysuria or change in urinary frequency Neurological: No headache, dizziness or syncope Musculoskeletal: No new muscle or joint pain Hematologic: No bleeding or bruising Psychiatric: No history of depression or anxiety    Physical Exam:  Vital signs: .BP 102/60   Pulse 76   Ht 5\' 6"  (1.676 m)   Wt 150 lb 0.4 oz (68.1 kg)   BMI 24.21 kg/m    General:   Pleasant Caucasian male appears to be in NAD, Well developed, Well nourished, alert and cooperative Head:  Normocephalic and atraumatic. Eyes:   PEERL, EOMI. No icterus. Conjunctiva pink. Ears:  Normal auditory acuity. Neck:  Supple Throat: Oral cavity and pharynx without inflammation, swelling or lesion. Teeth in good condition. Lungs: Respirations even and unlabored. Lungs clear to auscultation bilaterally.   No wheezes, crackles, or rhonchi.  Heart: Normal S1, S2. No MRG. Regular rate and rhythm. No peripheral edema, cyanosis or pallor.  Abdomen:  Soft, nondistended, TTP in the epigastrium/superior periumbilical region No rebound or guarding. Normal bowel sounds. No appreciable masses or hepatomegaly. Rectal:  Not performed.  Msk:  Symmetrical without gross deformities.  Extremities:  Without edema, no deformity or joint abnormality.  Neurologic:  Alert and  oriented x4;  grossly normal neurologically.  Skin:   Dry and intact without significant lesions or rashes. Psychiatric: Oriented to person, place and time. Demonstrates good judgement and reason  without abnormal affect or behaviors.  Recent labs: See history of present illness, done by PCP an outside clinic  Assessment: 1.Weight loss: Patient reports an unexplained weight loss of around 2 pounds per month starting at the beginning of this year, recent labs within normal limits other than minimally elevated lipase at 83, CT abdomen and pelvis within normal limits; question malabsorption first gastritis versus cancer versus pancreatitis versus other 2. Periumbilical pain: Patient reports pain over the past 2 months worsened by eating, in the periumbilical/epigastric region, has no history of heartburn or reflux, lipase minimally elevated as above, the CT was normal; consider gastritis versus H. pylori versus pancreatitis versus other 3. Change in bowel habits: Patient reports change from one formed bowel movement per day to now loose "chocolate" stools over the past 2 months multiple times a day; consider malabsorption versus cancer  versus other  Plan: 1. Recommend the patient proceed with an EGD and colonoscopy for further evaluation. Discussed this with Dr. Carlean Gibson at time of the patient's appointment. Discussed risks, benefits, limitations and alternatives and the patient agrees to proceed. 2. Will recheck lipase today and compare to previous. 3. Patient to follow in clinic as directed after time of upcoming procedures  Ellouise Newer, PA-C [facility] 01/29/2016, 1:33 PM  Cc: Leonides Sake, MD

## 2016-01-30 ENCOUNTER — Encounter: Payer: Self-pay | Admitting: Internal Medicine

## 2016-01-30 NOTE — Progress Notes (Signed)
Lipase is up more  Lab Results  Component Value Date   LIPASE 113.0 (H) 01/29/2016    Could be coming from intestine since pancreas ok on CT  EGD/colonoscopy next as per Ms. Lemmon's note  Wt Readings from Last 3 Encounters:  01/29/16 150 lb 0.4 oz (68.1 kg)  08/30/12 172 lb 2 oz (78.1 kg)  03/22/12 171 lb (77.6 kg)   Gatha Mayer, MD, [facility]

## 2016-02-06 DIAGNOSIS — M10071 Idiopathic gout, right ankle and foot: Secondary | ICD-10-CM | POA: Diagnosis not present

## 2016-02-06 DIAGNOSIS — M79671 Pain in right foot: Secondary | ICD-10-CM | POA: Diagnosis not present

## 2016-02-13 ENCOUNTER — Encounter: Payer: Self-pay | Admitting: Internal Medicine

## 2016-02-13 ENCOUNTER — Ambulatory Visit (AMBULATORY_SURGERY_CENTER): Payer: Medicare Other | Admitting: Internal Medicine

## 2016-02-13 VITALS — BP 138/78 | HR 65 | Temp 98.2°F | Resp 12 | Ht 66.0 in | Wt 150.0 lb

## 2016-02-13 DIAGNOSIS — K639 Disease of intestine, unspecified: Secondary | ICD-10-CM | POA: Diagnosis not present

## 2016-02-13 DIAGNOSIS — K2990 Gastroduodenitis, unspecified, without bleeding: Secondary | ICD-10-CM | POA: Diagnosis not present

## 2016-02-13 DIAGNOSIS — R194 Change in bowel habit: Secondary | ICD-10-CM | POA: Diagnosis not present

## 2016-02-13 DIAGNOSIS — K317 Polyp of stomach and duodenum: Secondary | ICD-10-CM

## 2016-02-13 DIAGNOSIS — R634 Abnormal weight loss: Secondary | ICD-10-CM | POA: Diagnosis present

## 2016-02-13 DIAGNOSIS — R1084 Generalized abdominal pain: Secondary | ICD-10-CM

## 2016-02-13 DIAGNOSIS — K529 Noninfective gastroenteritis and colitis, unspecified: Secondary | ICD-10-CM | POA: Diagnosis not present

## 2016-02-13 DIAGNOSIS — Z1211 Encounter for screening for malignant neoplasm of colon: Secondary | ICD-10-CM | POA: Diagnosis not present

## 2016-02-13 DIAGNOSIS — K2970 Gastritis, unspecified, without bleeding: Secondary | ICD-10-CM

## 2016-02-13 MED ORDER — SODIUM CHLORIDE 0.9 % IV SOLN: 500.0000 mL | INTRAVENOUS | Status: DC

## 2016-02-13 NOTE — Progress Notes (Signed)
Called to room to assist during endoscopic procedure.  Patient ID and intended procedure confirmed with present staff. Received instructions for my participation in the procedure from the performing physician.  

## 2016-02-13 NOTE — Patient Instructions (Addendum)
I did not find an obvious cause of your problems but there were 3 abnormalities:  1) stomach polyp - biopsied and unlikely cause of problems 2) Stomach inflammation - gastritis - biopsied - possible cause 3) Two spots of inflammation in the colon - biopsied and unlikely to be a problem.  Results back next week and will see what that tells [place] and next steps in evaluation.  I appreciate the opportunity to care for you. Gatha Mayer, MD, FACG  YOU HAD AN ENDOSCOPIC PROCEDURE TODAY AT [facility]:   Refer to the procedure report that was given to you for any specific questions about what was found during the examination.  If the procedure report does not answer your questions, please call your gastroenterologist to clarify.  If you requested that your care partner not be given the details of your procedure findings, then the procedure report has been included in a sealed envelope for you to review at your convenience later.  YOU SHOULD EXPECT: Some feelings of bloating in the abdomen. Passage of more gas than usual.  Walking can help get rid of the air that was put into your GI tract during the procedure and reduce the bloating. If you had a lower endoscopy (such as a colonoscopy or flexible sigmoidoscopy) you may notice spotting of blood in your stool or on the toilet paper. If you underwent a bowel prep for your procedure, you may not have a normal bowel movement for a few days.  Please Note:  You might notice some irritation and congestion in your nose or some drainage.  This is from the oxygen used during your procedure.  There is no need for concern and it should clear up in a day or so.  SYMPTOMS TO REPORT IMMEDIATELY:   Following lower endoscopy (colonoscopy or flexible sigmoidoscopy):  Excessive amounts of blood in the stool  Significant tenderness or worsening of abdominal pains  Swelling of the abdomen that is new, acute  Fever of 100F or  higher   Following upper endoscopy (EGD)  Vomiting of blood or coffee ground material  New chest pain or pain under the shoulder blades  Painful or persistently difficult swallowing  New shortness of breath  Fever of 100F or higher  Black, tarry-looking stools  For urgent or emergent issues, a gastroenterologist can be reached at any hour by calling [phone].   DIET:  We do recommend a small meal at first, but then you may proceed to your regular diet.  Drink plenty of fluids but you should avoid alcoholic beverages for 24 hours.  ACTIVITY:  You should plan to take it easy for the rest of today and you should NOT DRIVE or use heavy machinery until tomorrow (because of the sedation medicines used during the test).    FOLLOW UP: Our staff will call the number listed on your records the next business day following your procedure to check on you and address any questions or concerns that you may have regarding the information given to you following your procedure. If we do not reach you, we will leave a message.  However, if you are feeling well and you are not experiencing any problems, there is no need to return our call.  We will assume that you have returned to your regular daily activities without incident.  If any biopsies were taken you will be contacted by phone or by letter within the next 1-3 weeks.  Please call us at [phone] if you have not heard about the biopsies in 3 weeks.    SIGNATURES/CONFIDENTIALITY: You and/or your care partner have signed paperwork which will be entered into your electronic medical record.  These signatures attest to the fact that that the information above on your After Visit Summary has been reviewed and is understood.  Full responsibility of the confidentiality of this discharge information lies with you and/or your care-partner.

## 2016-02-13 NOTE — Progress Notes (Signed)
A and O x3. Report to RN. Tolerated MAC anesthesia well.Teeth unchanged after procedure. 

## 2016-02-13 NOTE — Op Note (Signed)
[facility] Patient Name: Keith Gibson Procedure Date: 02/13/2016 7:42 AM MRN: [id number] Endoscopist: Gatha Mayer , MD Age: 75 Referring MD:  Date of Birth: 16-Dec-1940 Gender: Male Account #: [id number] Procedure:                Colonoscopy Indications:              Change in bowel habits, Weight loss Medicines:                Propofol per Anesthesia, Monitored Anesthesia Care Procedure:                Pre-Anesthesia Assessment:                           - Prior to the procedure, a History and Physical                            was performed, and patient medications and                            allergies were reviewed. The patient's tolerance of                            previous anesthesia was also reviewed. The risks                            and benefits of the procedure and the sedation                            options and risks were discussed with the patient.                            All questions were answered, and informed consent                            was obtained. Prior Anticoagulants: The patient                            last took aspirin on the day of the procedure. ASA                            Grade Assessment: II - A patient with mild systemic                            disease. After reviewing the risks and benefits,                            the patient was deemed in satisfactory condition to                            undergo the procedure.                           After obtaining informed consent, the colonoscope  was passed under direct vision. Throughout the                            procedure, the patient's blood pressure, pulse, and                            oxygen saturations were monitored continuously. The                            Model CF-HQ190L [id number]) scope was introduced                            through the anus and advanced to the the terminal                            ileum, with  identification of the appendiceal                            orifice and IC valve. The colonoscopy was performed                            without difficulty. The bowel preparation used was                            Miralax. The terminal ileum, ileocecal valve,                            appendiceal orifice, and rectum were photographed.                            The quality of the bowel preparation was good. Scope In: 7:42:47 AM Scope Out: 7:58:37 AM Scope Withdrawal Time: 0 hours 13 minutes 8 seconds  Total Procedure Duration: 0 hours 15 minutes 50 seconds  Findings:                 The perianal and digital rectal examinations were                            normal. Pertinent negatives include normal prostate                            (size, shape, and consistency).                           Two localized non-bleeding erosions were found in                            the cecum. Biopsies were taken with a cold forceps                            for histology. Verification of patient                            identification for the specimen was done. Estimated  blood loss was minimal.                           The terminal ileum appeared normal.                           The exam was otherwise without abnormality on                            direct and retroflexion views. Complications:            No immediate complications. Estimated Blood Loss:     Estimated blood loss was minimal. Impression:               - Two erosions in the cecum. Biopsied.                           - The examined portion of the ileum was normal.                           - The examination was otherwise normal on direct                            and retroflexion views. Recommendation:           - Patient has a contact number available for                            emergencies. The signs and symptoms of potential                            delayed complications were discussed with  the                            patient. Return to normal activities tomorrow.                            Written discharge instructions were provided to the                            patient.                           - Resume previous diet.                           - Continue present medications.                           - Await pathology results.                           - See the other procedure note for documentation of                            additional recommendations.                           -  Repeat colonoscopy is recommended. The                            colonoscopy date will be determined after pathology                            results from today's exam become available for                            review. Gatha Mayer, MD 02/13/2016 8:16:51 AM This report has been signed electronically.

## 2016-02-13 NOTE — Progress Notes (Signed)
Pt reported he drank 1 oz of water at 0600 to take his meds this am. Corky Sing

## 2016-02-13 NOTE — Op Note (Signed)
[facility] Patient Name: Keith Gibson Procedure Date: 02/13/2016 7:28 AM MRN: [id number] Endoscopist: Gatha Mayer , MD Age: 75 Referring MD:  Date of Birth: 12/30/1940 Gender: Male Account #: [id number] Procedure:                Upper GI endoscopy Indications:              Generalized abdominal pain, Weight loss Medicines:                Propofol per Anesthesia, Monitored Anesthesia Care Procedure:                Pre-Anesthesia Assessment:                           - Prior to the procedure, a History and Physical                            was performed, and patient medications and                            allergies were reviewed. The patient's tolerance of                            previous anesthesia was also reviewed. The risks                            and benefits of the procedure and the sedation                            options and risks were discussed with the patient.                            All questions were answered, and informed consent                            was obtained. Prior Anticoagulants: The patient                            last took aspirin on the day of the procedure. ASA                            Grade Assessment: II - A patient with mild systemic                            disease. After reviewing the risks and benefits,                            the patient was deemed in satisfactory condition to                            undergo the procedure.                           After obtaining informed consent, the endoscope was  passed under direct vision. Throughout the                            procedure, the patient's blood pressure, pulse, and                            oxygen saturations were monitored continuously. The                            Model GIF-HQ190 [id number]) scope was introduced                            through the mouth, and advanced to the second part                            of  duodenum. The upper GI endoscopy was                            accomplished without difficulty. The patient                            tolerated the procedure well. Scope In: Scope Out: Findings:                 Diffuse mild inflammation characterized by                            erosions, erythema and friability was found in the                            gastric antrum. Biopsies were taken with a cold                            forceps for histology. Verification of patient                            identification for the specimen was done. Estimated                            blood loss was minimal.                           A single 6 mm sessile polyp with no stigmata of                            recent bleeding was found on the anterior wall of                            the stomach. Biopsies were taken with a cold                            forceps for histology. Verification of patient  identification for the specimen was done. Estimated                            blood loss was minimal.                           The exam was otherwise without abnormality.                           The cardia and gastric fundus were normal on                            retroflexion. Complications:            No immediate complications. Estimated Blood Loss:     Estimated blood loss was minimal. Impression:               - Gastritis. Biopsied.                           - A single gastric polyp. Biopsied.                           - The examination was otherwise normal. Recommendation:           - Patient has a contact number available for                            emergencies. The signs and symptoms of potential                            delayed complications were discussed with the                            patient. Return to normal activities tomorrow.                            Written discharge instructions were provided to the                             patient.                           - Resume previous diet.                           - Continue present medications.                           - Await pathology results.                           - See the other procedure note for documentation of                            additional recommendations. Gatha Mayer, MD 02/13/2016 8:12:32 AM This report has been signed electronically.

## 2016-02-13 NOTE — Progress Notes (Signed)
Keith Gibson, Tech reported to [employer], CRNA that pt stopped drinking at 4:30 this am but took his meds at 6:00 1 oz of water this am.  OK to proceed with procedure. maw

## 2016-02-14 ENCOUNTER — Telehealth: Payer: Self-pay | Admitting: *Deleted

## 2016-02-14 NOTE — Telephone Encounter (Signed)
  Follow up Call-  Call back number 02/13/2016  Post procedure Call Back phone  # [phone] hm  Permission to leave phone message Yes  Some recent data might be hidden     Patient questions:  Do you have a fever, pain , or abdominal swelling? No. Pain Score  0 *  Have you tolerated food without any problems? Yes.    Have you been able to return to your normal activities? Yes.    Do you have any questions about your discharge instructions: Diet   No. Medications  No. Follow up visit  No.  Do you have questions or concerns about your Care? No.  Actions: * If pain score is 4 or above: No action needed, pain <4.

## 2016-02-17 ENCOUNTER — Telehealth: Payer: Self-pay | Admitting: Internal Medicine

## 2016-02-17 NOTE — Telephone Encounter (Signed)
Spoke with pt and discussed that he can try miralax to have a bowel movement.

## 2016-02-25 ENCOUNTER — Telehealth: Payer: Self-pay | Admitting: Internal Medicine

## 2016-02-25 NOTE — Telephone Encounter (Signed)
I called patient and explained that there has been a delay in getting the results back from pathology and we will let her know when the results return

## 2016-02-26 ENCOUNTER — Encounter: Payer: Self-pay | Admitting: Internal Medicine

## 2016-02-26 NOTE — Progress Notes (Signed)
The biopsies do not explain why he has had weight loss and a change in bowel habits. One thing to consider is if he is not getting good blood flow to the intestines which is called mesenteric ischemia.  I recommend he have a CT angiogram of the abdomen and pelvis to see if that tells [place] more.  [facility] - no letter or recall Please cc his PCP as usual on this

## 2016-02-27 ENCOUNTER — Other Ambulatory Visit (INDEPENDENT_AMBULATORY_CARE_PROVIDER_SITE_OTHER): Payer: Medicare Other

## 2016-02-27 ENCOUNTER — Other Ambulatory Visit: Payer: Self-pay

## 2016-02-27 DIAGNOSIS — R194 Change in bowel habit: Secondary | ICD-10-CM | POA: Diagnosis not present

## 2016-02-27 DIAGNOSIS — R634 Abnormal weight loss: Secondary | ICD-10-CM

## 2016-02-27 DIAGNOSIS — H401132 Primary open-angle glaucoma, bilateral, moderate stage: Secondary | ICD-10-CM | POA: Diagnosis not present

## 2016-02-27 DIAGNOSIS — H11423 Conjunctival edema, bilateral: Secondary | ICD-10-CM | POA: Diagnosis not present

## 2016-02-27 DIAGNOSIS — H18413 Arcus senilis, bilateral: Secondary | ICD-10-CM | POA: Diagnosis not present

## 2016-02-27 DIAGNOSIS — H353131 Nonexudative age-related macular degeneration, bilateral, early dry stage: Secondary | ICD-10-CM | POA: Diagnosis not present

## 2016-02-27 LAB — BUN: BUN: 22 mg/dL (ref 6–23)

## 2016-02-27 LAB — CREATININE, SERUM: CREATININE: 1.43 mg/dL (ref 0.40–1.50)

## 2016-02-27 NOTE — Telephone Encounter (Signed)
This encounter was created in error - please disregard.

## 2016-03-05 ENCOUNTER — Ambulatory Visit (INDEPENDENT_AMBULATORY_CARE_PROVIDER_SITE_OTHER)
Admission: RE | Admit: 2016-03-05 | Discharge: 2016-03-05 | Disposition: A | Discharge status: Home / Self Care | Payer: Medicare Other | Source: Ambulatory Visit | Attending: Internal Medicine | Admitting: Internal Medicine

## 2016-03-05 DIAGNOSIS — R634 Abnormal weight loss: Secondary | ICD-10-CM | POA: Diagnosis not present

## 2016-03-05 DIAGNOSIS — I701 Atherosclerosis of renal artery: Secondary | ICD-10-CM | POA: Diagnosis not present

## 2016-03-05 DIAGNOSIS — R194 Change in bowel habit: Secondary | ICD-10-CM | POA: Diagnosis not present

## 2016-03-05 MED ORDER — IOPAMIDOL (ISOVUE-370) INJECTION 76%
100.0000 mL | Freq: Once | INTRAVENOUS | Status: AC | PRN
  Administered 2016-03-05: 100 mL via INTRAVENOUS

## 2016-03-06 NOTE — Progress Notes (Signed)
The CT indicates that his problems MAY be coming from poor blood flow to the intestine.  We need to refer him to vascular surgery for an opinion/consult - celiac and superior mesenteric artery stenoses ? Mesenteric ischemia  Please print and fax a copy of the report and our notes to Dr. Daiva Eves - PCP

## 2016-03-09 ENCOUNTER — Other Ambulatory Visit: Payer: Self-pay

## 2016-03-09 DIAGNOSIS — K551 Chronic vascular disorders of intestine: Secondary | ICD-10-CM

## 2016-03-09 DIAGNOSIS — I774 Celiac artery compression syndrome: Secondary | ICD-10-CM

## 2016-03-09 DIAGNOSIS — I771 Stricture of artery: Secondary | ICD-10-CM

## 2016-03-25 ENCOUNTER — Encounter: Payer: Self-pay | Admitting: Vascular Surgery

## 2016-03-26 ENCOUNTER — Other Ambulatory Visit: Payer: Self-pay | Admitting: *Deleted

## 2016-03-26 ENCOUNTER — Ambulatory Visit (INDEPENDENT_AMBULATORY_CARE_PROVIDER_SITE_OTHER): Payer: Medicare Other | Admitting: Vascular Surgery

## 2016-03-26 ENCOUNTER — Encounter: Payer: Self-pay | Admitting: Vascular Surgery

## 2016-03-26 VITALS — BP 135/77 | HR 74 | Temp 97.4°F | Resp 18 | Ht 67.0 in | Wt 151.0 lb

## 2016-03-26 DIAGNOSIS — K551 Chronic vascular disorders of intestine: Secondary | ICD-10-CM

## 2016-03-26 DIAGNOSIS — N5201 Erectile dysfunction due to arterial insufficiency: Secondary | ICD-10-CM | POA: Diagnosis not present

## 2016-03-26 NOTE — Progress Notes (Signed)
Referring Physician: Silvano Rusk, MD  Patient name: Keith Gibson MRN: [id number] DOB: [birth year] Sex: male  REASON FOR CONSULT: Possible chronic mesenteric ischemia  HPI: Keith Gibson is a 75 y.o. male,  with a two-year history of slow weight loss of 20 pounds. The patient denies any changes in his diet or exercise pattern. He has had some changes in his bowel movement habits. He states he does have some loose stools occasionally and his bowel movements are fairly unpredictable. He denies any postprandial abdominal pain. He states the only time he gets abdominal pain is with spicy foods. He does describe a bandlike or full sensation across his upper portion of his abdomen but does not really know what relates to this symptom. He is a former smoker but quit smoking in 1998. He is on aspirin and a statin. He denies any family history of abdominal aortic aneurysm. He did have a stroke 2 years ago and had a right carotid endarterectomy in [place]. His carotids are currently followed by a doctor at [facility]. He also has a history of erectile dysfunction. Upper endoscopy one month ago showed gastritis and a gastric polyp. Pathology confirmed this. Colonoscopy showed 2 erosions in the cecum which were biopsied. Colon biopsy  showed active colitis. Otherwise normal exam. Other medical problems include COPD, hyperlipidemia, hypertension all of which are currently stable.  Past Medical History:  Diagnosis Date  . 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 Cataract    bil removed  . COPD (chronic obstructive pulmonary disease) ([place])   . Glaucoma   . Hyperlipidemia   . Hypertension   . 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 Shortness of breath   . Wears dentures    full top-partial bottom  . Wears glasses   . Wears hearing aid    both ears   Past Surgical  History:  Procedure Laterality Date  . APPENDECTOMY    . COLONOSCOPY    . EYE SURGERY Bilateral     laser, cataract surgery  . KNEE SURGERY  1990   arthroscopic on right knee  . SHOULDER ARTHROSCOPY WITH ROTATOR CUFF REPAIR AND SUBACROMIAL DECOMPRESSION Right 08/30/2012   Procedure: RIGHT SHOULDER ARTHROSCOPY WITH SUBACROMIAL DECOMPRESSION, DISTAL CLAVICLE RESECTION AND ROTATOR CUFF REPAIR;  Surgeon: Cammie Sickle., MD;  Location: [facility];  Service: Orthopedics;  Laterality: Right;  . TONSILLECTOMY    . vocal cord surg  1992   removed precancerous polyps    Family History  Problem Relation Age of Onset  . Heart Problems Mother   . Colon cancer Neg Hx     SOCIAL HISTORY: Social History   Social History  . Marital status: Married    Spouse name: N/A  . Number of children: 4  . Years of education: N/A   Occupational History  . Not on file.   Social History Main Topics  . Smoking status: Former Smoker    Types: Cigarettes    Quit date: 03/08/1997  . Smokeless tobacco: Never Used  . Alcohol use 8.4 oz/week    14 Cans of beer per week     Comment: occ  . Drug use: Unknown  . Sexual activity: Not on file   Other Topics Concern  . Not on file   Social History Narrative  .  No narrative on file    No Known Allergies  Current Outpatient Prescriptions  Medication Sig Dispense Refill  . Artificial Tear Solution (SYSTANE CONTACTS) SOLN Apply to eye.    Marland Kitchen aspirin 81 MG tablet Take 81 mg by mouth daily.    Marland Kitchen atorvastatin (LIPITOR) 20 MG tablet Take 20 mg by mouth daily.    . brimonidine (ALPHAGAN P) 0.1 % SOLN     . donepezil (ARICEPT) 10 MG tablet Take 10 mg by mouth every morning.    Marland Kitchen lisinopril (PRINIVIL,ZESTRIL) 10 MG tablet Take 10 mg by mouth daily.    . Multiple Vitamins-Minerals (OCUVITE ADULT 50+ PO) Take by mouth daily.    . Omega-3 Fatty Acids (FISH OIL) 1000 MG CAPS Take by mouth daily.    . Potassium Gluconate 595 MG CAPS Take by  mouth.    . sildenafil (VIAGRA) 25 MG tablet Take 25 mg by mouth daily as needed for erectile dysfunction.     Current Facility-Administered Medications  Medication Dose Route Frequency Provider Last Rate Last Dose  . 0.9 %  sodium chloride infusion  500 mL Intravenous Continuous Gatha Mayer, MD        ROS:   General:  + weight loss, No Fever, chills  HEENT: No recent headaches, no nasal bleeding, no visual changes, no sore throat  Neurologic: No dizziness, blackouts, seizures. No recent symptoms of stroke or mini- stroke. No recent episodes of slurred speech, or temporary blindness.  Cardiac: No recent episodes of chest pain/pressure, no shortness of breath at rest.  + shortness of breath with exertion.  Denies history of atrial fibrillation or irregular heartbeat  Vascular: No history of rest pain in feet.  No history of claudication.  No history of non-healing ulcer, No history of DVT   Pulmonary: No home oxygen, no productive cough, no hemoptysis,  No asthma or wheezing  Musculoskeletal:  [ ]  Arthritis, [ ]  Low back pain,  [ ]  Joint pain  Hematologic:No history of hypercoagulable state.  No history of easy bleeding.  No history of anemia  Gastrointestinal: No hematochezia or melena,  No gastroesophageal reflux, no trouble swallowing  Urinary: [ ]  chronic Kidney disease, [ ]  on HD - [ ]  MWF or [ ]  TTHS, [ ]  Burning with urination, [ ]  Frequent urination, [ ]  Difficulty urinating;   Skin: No rashes  Psychological: No history of anxiety,  No history of depression   Physical Examination  Vitals:   03/26/16 0912  BP: 135/77  Pulse: 74  Resp: 18  Temp: 97.4 F (36.3 C)  SpO2: 97%  Weight: 151 lb (68.5 kg)  Height: 5\' 7"  (1.702 m)    Body mass index is 23.65 kg/m.  General:  Alert and oriented, no acute distress HEENT: Normal Neck: No bruit or JVD, well healed right neck scar Pulmonary: Clear to auscultation bilaterally Cardiac: Regular Rate and Rhythm without  murmur Abdomen: Soft, non-tender, non-distended, no mass, no bruit  Skin: No rash Extremity Pulses:  2+ radial, brachial, femoral, dorsalis pedis, posterior tibial pulses bilaterally Musculoskeletal: No deformity or edema  Neurologic: Upper and lower extremity motor 5/5 and symmetric  DATA:  Patient has CT Angio the abdomen and pelvis dated 03/05/2016. I reviewed his findings today. This showed calcification of the infrarenal and suprarenal aorta. There is a potential 70% narrowing of the proximals. Mesenteric artery. Possible 50% narrowing of the celiac artery. Patent IMA. There is some calcification at the origins of the renal arteries as well  as the left common iliac artery as well. There were also numerous cysts in the liver. There was also some fullness of the pancreatic head.  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 He also has findings on his CT Angio which suggests multi vessel mesenteric occlusive disease. He has diffuse atherosclerotic changes of his supra and infrarenal abdominal aorta. He also has suggestion of renal artery stenosis as well as clinical symptoms of erectile dysfunction which go along with pelvic artery occlusive disease. Although he has some evidence of left iliac stenosis on CT scan he has palpable pedal pulses and exhibits no claudication symptoms.   PLAN:  Abdominal aortogram with focus on the mesenteric and renal vessels but also lower extremity runoff due to evidence of lower extremity occlusive disease by CT scan. Possible mesenteric intervention if high-grade stenosis is noted. Risks benefits possible complications and procedure details were discussed with the patient and his wife today. They understand and agree to proceed. Possible complications including but not limited to bleeding infection contrast reaction renal dysfunction   [name], MD [facility]  [place] [place] Office: [phone] Pager: [phone]

## 2016-03-31 ENCOUNTER — Encounter (HOSPITAL_COMMUNITY): Payer: Self-pay

## 2016-04-02 ENCOUNTER — Encounter: Payer: Self-pay | Admitting: Surgery

## 2016-04-10 ENCOUNTER — Encounter (HOSPITAL_COMMUNITY): Payer: Self-pay | Admitting: Vascular Surgery

## 2016-04-10 ENCOUNTER — Encounter (HOSPITAL_COMMUNITY)
Admission: RE | Disposition: A | Discharge status: Home / Self Care | Payer: Self-pay | Source: Ambulatory Visit | Attending: Vascular Surgery

## 2016-04-10 ENCOUNTER — Ambulatory Visit (HOSPITAL_COMMUNITY)
Admission: RE | Admit: 2016-04-10 | Discharge: 2016-04-10 | Disposition: A | Discharge status: Home / Self Care | Payer: Medicare Other | Source: Ambulatory Visit | Attending: Vascular Surgery | Admitting: Vascular Surgery

## 2016-04-10 DIAGNOSIS — J449 Chronic obstructive pulmonary disease, unspecified: Secondary | ICD-10-CM | POA: Diagnosis not present

## 2016-04-10 DIAGNOSIS — K551 Chronic vascular disorders of intestine: Secondary | ICD-10-CM | POA: Insufficient documentation

## 2016-04-10 DIAGNOSIS — Z8673 Personal history of transient ischemic attack (TIA), and cerebral infarction without residual deficits: Secondary | ICD-10-CM | POA: Insufficient documentation

## 2016-04-10 DIAGNOSIS — Z87891 Personal history of nicotine dependence: Secondary | ICD-10-CM | POA: Insufficient documentation

## 2016-04-10 DIAGNOSIS — Z79899 Other long term (current) drug therapy: Secondary | ICD-10-CM | POA: Diagnosis not present

## 2016-04-10 DIAGNOSIS — I774 Celiac artery compression syndrome: Secondary | ICD-10-CM | POA: Diagnosis not present

## 2016-04-10 DIAGNOSIS — I701 Atherosclerosis of renal artery: Secondary | ICD-10-CM | POA: Diagnosis not present

## 2016-04-10 DIAGNOSIS — Z7982 Long term (current) use of aspirin: Secondary | ICD-10-CM | POA: Insufficient documentation

## 2016-04-10 DIAGNOSIS — I70203 Unspecified atherosclerosis of native arteries of extremities, bilateral legs: Secondary | ICD-10-CM | POA: Insufficient documentation

## 2016-04-10 DIAGNOSIS — I10 Essential (primary) hypertension: Secondary | ICD-10-CM | POA: Insufficient documentation

## 2016-04-10 DIAGNOSIS — E785 Hyperlipidemia, unspecified: Secondary | ICD-10-CM | POA: Insufficient documentation

## 2016-04-10 DIAGNOSIS — R634 Abnormal weight loss: Secondary | ICD-10-CM | POA: Diagnosis present

## 2016-04-10 HISTORY — PX: PERIPHERAL VASCULAR CATHETERIZATION: SHX172C

## 2016-04-10 LAB — POCT I-STAT, CHEM 8
BUN: 27 mg/dL — AB (ref 6–20)
CALCIUM ION: 1.17 mmol/L (ref 1.15–1.40)
CHLORIDE: 102 mmol/L (ref 101–111)
Creatinine, Ser: 1.4 mg/dL — ABNORMAL HIGH (ref 0.61–1.24)
GLUCOSE: 90 mg/dL (ref 65–99)
HCT: 45 % (ref 39.0–52.0)
Hemoglobin: 15.3 g/dL (ref 13.0–17.0)
Potassium: 4.4 mmol/L (ref 3.5–5.1)
Sodium: 138 mmol/L (ref 135–145)
TCO2: 26 mmol/L (ref 0–100)

## 2016-04-10 SURGERY — ABDOMINAL AORTOGRAM

## 2016-04-10 MED ORDER — ACETAMINOPHEN 325 MG RE SUPP: 325.0000 mg | RECTAL | Status: DC | PRN

## 2016-04-10 MED ORDER — HYDRALAZINE HCL 20 MG/ML IJ SOLN: 5.0000 mg | INTRAMUSCULAR | Status: DC | PRN

## 2016-04-10 MED ORDER — POTASSIUM CHLORIDE CRYS ER 20 MEQ PO TBCR: 20.0000 meq | EXTENDED_RELEASE_TABLET | Freq: Every day | ORAL | Status: DC | PRN

## 2016-04-10 MED ORDER — HEPARIN (PORCINE) IN NACL 2-0.9 UNIT/ML-% IJ SOLN
INTRAMUSCULAR | Status: AC
  Filled 2016-04-10: qty 1000

## 2016-04-10 MED ORDER — GUAIFENESIN-DM 100-10 MG/5ML PO SYRP: 15.0000 mL | ORAL_SOLUTION | ORAL | Status: DC | PRN

## 2016-04-10 MED ORDER — SODIUM CHLORIDE 0.45 % IV SOLN: INTRAVENOUS | Status: DC

## 2016-04-10 MED ORDER — ACETAMINOPHEN 325 MG PO TABS: 325.0000 mg | ORAL_TABLET | ORAL | Status: DC | PRN

## 2016-04-10 MED ORDER — HEPARIN (PORCINE) IN NACL 2-0.9 UNIT/ML-% IJ SOLN
INTRAMUSCULAR | Status: DC | PRN
  Administered 2016-04-10: 1000 mL

## 2016-04-10 MED ORDER — METOPROLOL TARTRATE 5 MG/5ML IV SOLN: 2.0000 mg | INTRAVENOUS | Status: DC | PRN

## 2016-04-10 MED ORDER — SODIUM CHLORIDE 0.9 % IV SOLN
INTRAVENOUS | Status: DC
  Administered 2016-04-10: 06:00:00 via INTRAVENOUS

## 2016-04-10 MED ORDER — MAGNESIUM SULFATE 2 GM/50ML IV SOLN: 2.0000 g | Freq: Every day | INTRAVENOUS | Status: DC | PRN

## 2016-04-10 MED ORDER — SODIUM CHLORIDE 0.45 % IV SOLN
INTRAVENOUS | Status: AC
  Administered 2016-04-10: 500 mL via INTRAVENOUS

## 2016-04-10 MED ORDER — LIDOCAINE HCL (PF) 1 % IJ SOLN
INTRAMUSCULAR | Status: DC | PRN
  Administered 2016-04-10: 15 mL

## 2016-04-10 MED ORDER — IODIXANOL 320 MG/ML IV SOLN
INTRAVENOUS | Status: DC | PRN
  Administered 2016-04-10: 160 mL via INTRAVENOUS

## 2016-04-10 MED ORDER — LIDOCAINE HCL (PF) 1 % IJ SOLN
INTRAMUSCULAR | Status: AC
  Filled 2016-04-10: qty 30

## 2016-04-10 MED ORDER — MORPHINE SULFATE (PF) 10 MG/ML IV SOLN: 2.0000 mg | INTRAVENOUS | Status: DC | PRN

## 2016-04-10 MED ORDER — SODIUM CHLORIDE 0.9 % IV SOLN: 500.0000 mL | Freq: Once | INTRAVENOUS | Status: DC | PRN

## 2016-04-10 MED ORDER — ALUM & MAG HYDROXIDE-SIMETH 200-200-20 MG/5ML PO SUSP: 15.0000 mL | ORAL | Status: DC | PRN

## 2016-04-10 MED ORDER — LABETALOL HCL 5 MG/ML IV SOLN: 10.0000 mg | INTRAVENOUS | Status: DC | PRN

## 2016-04-10 MED ORDER — ONDANSETRON HCL 4 MG/2ML IJ SOLN: 4.0000 mg | Freq: Four times a day (QID) | INTRAMUSCULAR | Status: DC | PRN

## 2016-04-10 SURGICAL SUPPLY — 9 items
CATH ANGIO 5F PIGTAIL 65CM (CATHETERS) ×3 IMPLANT
COVER PRB 48X5XTLSCP FOLD TPE (BAG) ×2 IMPLANT
COVER PROBE 5X48 (BAG) ×3
KIT PV (KITS) ×3 IMPLANT
SHEATH PINNACLE 5F 10CM (SHEATH) ×3 IMPLANT
SYR MEDRAD MARK V 150ML (SYRINGE) ×3 IMPLANT
TRANSDUCER W/STOPCOCK (MISCELLANEOUS) ×3 IMPLANT
TRAY PV CATH (CUSTOM PROCEDURE TRAY) ×3 IMPLANT
WIRE HITORQ VERSACORE ST 145CM (WIRE) ×3 IMPLANT

## 2016-04-10 NOTE — H&P (View-Only) (Signed)
Referring Physician: Silvano Rusk, MD  Patient name: Keith Gibson MRN: [id number] DOB: [birth year] Sex: male  REASON FOR CONSULT: Possible chronic mesenteric ischemia  HPI: Keith Gibson is a 75 y.o. male,  with a two-year history of slow weight loss of 20 pounds. The patient denies any changes in his diet or exercise pattern. He has had some changes in his bowel movement habits. He states he does have some loose stools occasionally and his bowel movements are fairly unpredictable. He denies any postprandial abdominal pain. He states the only time he gets abdominal pain is with spicy foods. He does describe a bandlike or full sensation across his upper portion of his abdomen but does not really know what relates to this symptom. He is a former smoker but quit smoking in 1998. He is on aspirin and a statin. He denies any family history of abdominal aortic aneurysm. He did have a stroke 2 years ago and had a right carotid endarterectomy in [place]. His carotids are currently followed by a doctor at [facility]. He also has a history of erectile dysfunction. Upper endoscopy one month ago showed gastritis and a gastric polyp. Pathology confirmed this. Colonoscopy showed 2 erosions in the cecum which were biopsied. Colon biopsy  showed active colitis. Otherwise normal exam. Other medical problems include COPD, hyperlipidemia, hypertension all of which are currently stable.  Past Medical History:  Diagnosis Date  . 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 Cataract    bil removed  . COPD (chronic obstructive pulmonary disease) ([place])   . Glaucoma   . Hyperlipidemia   . Hypertension   . 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 Shortness of breath   . Wears dentures    full top-partial bottom  . Wears glasses   . Wears hearing aid    both ears   Past Surgical  History:  Procedure Laterality Date  . APPENDECTOMY    . COLONOSCOPY    . EYE SURGERY Bilateral     laser, cataract surgery  . KNEE SURGERY  1990   arthroscopic on right knee  . SHOULDER ARTHROSCOPY WITH ROTATOR CUFF REPAIR AND SUBACROMIAL DECOMPRESSION Right 08/30/2012   Procedure: RIGHT SHOULDER ARTHROSCOPY WITH SUBACROMIAL DECOMPRESSION, DISTAL CLAVICLE RESECTION AND ROTATOR CUFF REPAIR;  Surgeon: Cammie Sickle., MD;  Location: [facility];  Service: Orthopedics;  Laterality: Right;  . TONSILLECTOMY    . vocal cord surg  1992   removed precancerous polyps    Family History  Problem Relation Age of Onset  . Heart Problems Mother   . Colon cancer Neg Hx     SOCIAL HISTORY: Social History   Social History  . Marital status: Married    Spouse name: N/A  . Number of children: 4  . Years of education: N/A   Occupational History  . Not on file.   Social History Main Topics  . Smoking status: Former Smoker    Types: Cigarettes    Quit date: 03/08/1997  . Smokeless tobacco: Never Used  . Alcohol use 8.4 oz/week    14 Cans of beer per week     Comment: occ  . Drug use: Unknown  . Sexual activity: Not on file   Other Topics Concern  . Not on file   Social History Narrative  .  No narrative on file    No Known Allergies  Current Outpatient Prescriptions  Medication Sig Dispense Refill  . Artificial Tear Solution (SYSTANE CONTACTS) SOLN Apply to eye.    Marland Kitchen aspirin 81 MG tablet Take 81 mg by mouth daily.    Marland Kitchen atorvastatin (LIPITOR) 20 MG tablet Take 20 mg by mouth daily.    . brimonidine (ALPHAGAN P) 0.1 % SOLN     . donepezil (ARICEPT) 10 MG tablet Take 10 mg by mouth every morning.    Marland Kitchen lisinopril (PRINIVIL,ZESTRIL) 10 MG tablet Take 10 mg by mouth daily.    . Multiple Vitamins-Minerals (OCUVITE ADULT 50+ PO) Take by mouth daily.    . Omega-3 Fatty Acids (FISH OIL) 1000 MG CAPS Take by mouth daily.    . Potassium Gluconate 595 MG CAPS Take by  mouth.    . sildenafil (VIAGRA) 25 MG tablet Take 25 mg by mouth daily as needed for erectile dysfunction.     Current Facility-Administered Medications  Medication Dose Route Frequency Provider Last Rate Last Dose  . 0.9 %  sodium chloride infusion  500 mL Intravenous Continuous Gatha Mayer, MD        ROS:   General:  + weight loss, No Fever, chills  HEENT: No recent headaches, no nasal bleeding, no visual changes, no sore throat  Neurologic: No dizziness, blackouts, seizures. No recent symptoms of stroke or mini- stroke. No recent episodes of slurred speech, or temporary blindness.  Cardiac: No recent episodes of chest pain/pressure, no shortness of breath at rest.  + shortness of breath with exertion.  Denies history of atrial fibrillation or irregular heartbeat  Vascular: No history of rest pain in feet.  No history of claudication.  No history of non-healing ulcer, No history of DVT   Pulmonary: No home oxygen, no productive cough, no hemoptysis,  No asthma or wheezing  Musculoskeletal:  [ ]  Arthritis, [ ]  Low back pain,  [ ]  Joint pain  Hematologic:No history of hypercoagulable state.  No history of easy bleeding.  No history of anemia  Gastrointestinal: No hematochezia or melena,  No gastroesophageal reflux, no trouble swallowing  Urinary: [ ]  chronic Kidney disease, [ ]  on HD - [ ]  MWF or [ ]  TTHS, [ ]  Burning with urination, [ ]  Frequent urination, [ ]  Difficulty urinating;   Skin: No rashes  Psychological: No history of anxiety,  No history of depression   Physical Examination  Vitals:   03/26/16 0912  BP: 135/77  Pulse: 74  Resp: 18  Temp: 97.4 F (36.3 C)  SpO2: 97%  Weight: 151 lb (68.5 kg)  Height: 5\' 7"  (1.702 m)    Body mass index is 23.65 kg/m.  General:  Alert and oriented, no acute distress HEENT: Normal Neck: No bruit or JVD, well healed right neck scar Pulmonary: Clear to auscultation bilaterally Cardiac: Regular Rate and Rhythm without  murmur Abdomen: Soft, non-tender, non-distended, no mass, no bruit  Skin: No rash Extremity Pulses:  2+ radial, brachial, femoral, dorsalis pedis, posterior tibial pulses bilaterally Musculoskeletal: No deformity or edema  Neurologic: Upper and lower extremity motor 5/5 and symmetric  DATA:  Patient has CT Angio the abdomen and pelvis dated 03/05/2016. I reviewed his findings today. This showed calcification of the infrarenal and suprarenal aorta. There is a potential 70% narrowing of the proximals. Mesenteric artery. Possible 50% narrowing of the celiac artery. Patent IMA. There is some calcification at the origins of the renal arteries as well  as the left common iliac artery as well. There were also numerous cysts in the liver. There was also some fullness of the pancreatic head.  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 He also has findings on his CT Angio which suggests multi vessel mesenteric occlusive disease. He has diffuse atherosclerotic changes of his supra and infrarenal abdominal aorta. He also has suggestion of renal artery stenosis as well as clinical symptoms of erectile dysfunction which go along with pelvic artery occlusive disease. Although he has some evidence of left iliac stenosis on CT scan he has palpable pedal pulses and exhibits no claudication symptoms.   PLAN:  Abdominal aortogram with focus on the mesenteric and renal vessels but also lower extremity runoff due to evidence of lower extremity occlusive disease by CT scan. Possible mesenteric intervention if high-grade stenosis is noted. Risks benefits possible complications and procedure details were discussed with the patient and his wife today. They understand and agree to proceed. Possible complications including but not limited to bleeding infection contrast reaction renal dysfunction   [name], MD [facility] Office: [phone] Pager: [phone]

## 2016-04-10 NOTE — Op Note (Addendum)
Procedure: Abdominal aortogram with bilateral lower extremity runoff  Preoperative diagnosis: #1 chronic mesenteric ischemia  Postoperative diagnosis: Same  Anesthesia: Local  Operative findings: #1  90% stenosis celiac origin heavily calcified                                 #2  75-80% stenosis severely calcified superior mesenteric artery                                 #3  80% stenosis heavily calcified lesion left renal artery                                 #4  50% heavily calcified stenosis left common iliac artery                                 #5 bilateral 50-70% heavily calcified common femoral artery                                #6 intact bilateral lower extremity runoff 3 vessels left one-vessel right (posterior tibial)  Operative details: After obtaining informed consent, the patient was taken to the LAD. The patient was placed in supine position the Angio table. Both groins were prepped and draped in usual sterile fashion. Ultrasound was used to identify the left and right common femoral arteries. These both had a large amount of calcified exophytic plaque primarily layering posteriorly obscuring the lumen about 50% bilaterally. The anterior wall of the artery was fairly free of calcification bilaterally.  At this point local anesthesia was infiltrated over the right common femoral artery in an introducer needle was used to cannulate the right common femoral artery without difficulty. An 035 versacore wire was then threaded up into the abdominal aorta under fluoroscopic guidance. A 5 French sheath was placed over the guidewire in the right common femoral artery and thoroughly flushed with heparin saline. A 5 French pig catheter was then placed over the guidewire and advanced up into the abdominal aorta and abdominal aortogram was obtained in a lateral projection. This shows heavy calcification of the origins of the celiac and superior mesenteric arteries. There are multiple areas of  exophytic heavily calcified marblelike plaque in the origins of both vessels. The celiac is a fairly short stenosis. This extends about 1 cm. The superior mesenteric artery lesion extends about 2-3 cm. Next an AP view was obtained which did show the superior mesenteric artery was intact distally. The celiac was also intact. There was a large collateral extending from the inferior mesenteric artery across the left side of the abdomen. There is an 80% heavily calcified stenosis of the origin of the left renal artery that extends about 2-3 cm. The right renal artery is widely patent. Oblique view of the abdomen shows the origin of the inferior mesenteric artery is widely patent. The inferior mesenteric artery is a large artery that flows antegrade and fills distal branches of the celiac and superior mesenteric artery. There is a 50% stenosis of the left common iliac artery again heavily calcified. Left and right common and external iliac arteries are patent bilaterally.  Next bilateral lower extremity runoff views were  obtained through the pigtail catheter. In the right lower extremity, the right common femoral is patent with a 50% calcified stenosis in its midportion. The profunda femoris and superficial femoral arteries are widely patent. The popliteal artery is patent. The anterior tibial artery is patent proximally but sluggish flow in its midsection and does reconstitute distally suggestive of high-grade stenosis in the mid anterior tibial artery. The peroneal artery is diminutive. The posterior tibial artery is patent in continuity all the way to the foot and is the dominant runoff vessel to the right leg.  In the left lower extremity, the left common femoral profunda femoris and superficial femoral arteries are widely patent. The popliteal artery is patent. There is three-vessel runoff to the left foot.  At this point take the catheter was removed over a guidewire. The 5 French sheath was thoroughly  flushed with heparinized saline. The patient was taken to the holding area in stable condition. Sheath will be removed in the holding area.  Operative management: The patient has high-grade exophytic calcified plaque at the origin of the celiac ants. Mesenteric arteries. I do not believe that these would be very amenable to percutaneous intervention due to the calcific nature and risk of potentially cracking the artery or causing dissection. As far as the left renal artery stenosis is concerned patient has reasonably controlled blood pressure and does not have any renal dysfunction so I do not believe this needs to be addressed currently. We will schedule the patient for cardiac risk stratification to see if he may be a candidate for a mesenteric bypass to improve his symptoms. If his cardiac status is poor or his overall health status is thought to be poor and not able to tolerate a major mesenteric revascularization. We might would revisit percutaneous options although I think these may be limited.  [name], MD [facility] Office: [phone] Pager: [phone]

## 2016-04-10 NOTE — Discharge Instructions (Signed)
Angiogram, Care After °These instructions give you information about caring for yourself after your procedure. Your doctor may also give you more specific instructions. Call your doctor if you have any problems or questions after your procedure. °Follow these instructions at home: °· Take medicines only as told by your doctor. °· Follow your doctor's instructions about: °¨ Care of the area where the tube was inserted. °¨ Bandage (dressing) changes and removal. °· You may shower 24-48 hours after the procedure or as told by your doctor. °· Do not take baths, swim, or use a hot tub until your doctor approves. °· Every day, check the area where the tube was inserted. Watch for: °¨ Redness, swelling, or pain. °¨ Fluid, blood, or pus. °· Do not apply powder or lotion to the site. °· Do not lift anything that is heavier than 10 lb (4.5 kg) for 5 days or as told by your doctor. °· Ask your doctor when you can: °¨ Return to work or school. °¨ Do physical activities or play sports. °¨ Have sex. °· Do not drive or operate heavy machinery for 24 hours or as told by your doctor. °· Have someone with you for the first 24 hours after the procedure. °· Keep all follow-up visits as told by your doctor. This is important. °Contact a health care provider if: °· You have a fever. °· You have chills. °· You have more bleeding from the area where the tube was inserted. Hold pressure on the area. °· You have redness, swelling, or pain in the area where the tube was inserted. °· You have fluid or pus coming from the area. °Get help right away if: °· You have a lot of pain in the area where the tube was inserted. °· The area where the tube was inserted is bleeding, and the bleeding does not stop after 30 minutes of holding steady pressure on the area. °· The area near or just beyond the insertion site becomes pale, cool, tingly, or numb. °This information is not intended to replace advice given to you by your health care provider. Make  sure you discuss any questions you have with your health care provider. °Document Released: 07/31/2008 Document Revised: 10/10/2015 Document Reviewed: 10/05/2012 °Elsevier Interactive Patient Education © 2017 [employer] ° °

## 2016-04-10 NOTE — Progress Notes (Addendum)
Site area: RFA Site Prior to Removal:  Level 0 Pressure Applied For:20 min Manual:   yes Patient Status During Pull: stable  Post Pull Site:  Level 0 Post Pull Instructions Given:  yes Post Pull Pulses Present: palpable Dressing Applied: tegaderm  Bedrest begins @ 0910 till 1410 Comments: Keith Gibson / Keith Gibson

## 2016-04-10 NOTE — Interval H&P Note (Signed)
History and Physical Interval Note:  04/10/2016 7:42 AM  Keith Gibson  has presented today for surgery, with the diagnosis of pad w/ ulcer - mesentric stenosis  The various methods of treatment have been discussed with the patient and family. After consideration of risks, benefits and other options for treatment, the patient has consented to  Procedure(s): Abdominal Aortogram w/Lower Extremity (N/A) Visceral Angiography (N/A) as a surgical intervention .  The patient's history has been reviewed, patient examined, no change in status, stable for surgery.  I have reviewed the patient's chart and labs.  Questions were answered to the patient's satisfaction.     [name]

## 2016-04-15 ENCOUNTER — Other Ambulatory Visit: Payer: Self-pay

## 2016-04-15 DIAGNOSIS — Z01810 Encounter for preprocedural cardiovascular examination: Secondary | ICD-10-CM

## 2016-04-30 ENCOUNTER — Encounter: Payer: Self-pay | Admitting: Vascular Surgery

## 2016-05-01 ENCOUNTER — Encounter: Payer: Self-pay | Admitting: Cardiovascular Disease

## 2016-05-01 ENCOUNTER — Ambulatory Visit (INDEPENDENT_AMBULATORY_CARE_PROVIDER_SITE_OTHER): Payer: Medicare Other | Admitting: Cardiovascular Disease

## 2016-05-01 VITALS — BP 102/64 | HR 70 | Ht 67.0 in | Wt 153.0 lb

## 2016-05-01 DIAGNOSIS — Z01818 Encounter for other preprocedural examination: Secondary | ICD-10-CM | POA: Diagnosis not present

## 2016-05-01 DIAGNOSIS — I739 Peripheral vascular disease, unspecified: Secondary | ICD-10-CM | POA: Diagnosis not present

## 2016-05-01 NOTE — Patient Instructions (Signed)
Medication Instructions:  Your physician recommends that you continue on your current medications as directed. Please refer to the Current Medication list given to you today.   Labwork: None Ordered   Testing/Procedures: Your physician has requested that you have a lexiscan myoview. For further information please visit [email or web address]. Please follow instruction sheet, as given.   Follow-Up: Your physician wants you to follow-up in: 1 year with Dr. Nahser.  You will receive a reminder letter in the mail two months in advance. If you don't receive a letter, please call our office to schedule the follow-up appointment.   If you need a refill on your cardiac medications before your next appointment, please call your pharmacy.   Thank you for choosing [facility]! Gianah Batt, RN [phone]    

## 2016-05-01 NOTE — Progress Notes (Signed)
Cardiology Office Note   Date:  05/01/2016   ID:  Keith, Gibson Sep 18, 1940, MRN [id number]  PCP:  Leonides Sake, MD  Cardiologist:   Mertie Moores, MD   No chief complaint on file.  Problems 1. Peripheral arterial disease-mesenteric stenosis 2. 2. History of stroke-status post right carotid endarterectomy (Keith Gibson, [place])   3. COPD 4. Hyperlipidemia 5. HTN:       History of Present Illness: Keith Gibson is a 75 y.o. male who presents for eval for his PVD. Seen with sife ,  Keith Gibson.  Here for pre-op evaluation prior to mesenteric bypass.  Has lost 20 lbs over the past year or so.   No abdominal pain with eating. .   10-15 years ago, he had an attempted stress test but he cold not finish the test due to shortness of breath  Is active, does all of his yard work.  Is retired from [employer]. Then went into [employer], then [employer].   Smoked for 40 years and quit 10 years ago   Occasional episodes of chest pressure and DOE ( carrying a flower pot from one side of the yard to the other.    Past Medical History:  Diagnosis Date  . Cancer ([place]) throat /1998   precancerous polyps  . Cataract    bil removed  . COPD (chronic obstructive pulmonary disease) ([place])   . Glaucoma   . Hyperlipidemia   . Hypertension   . 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 Shortness of breath   . Wears dentures    full top-partial bottom  . Wears glasses   . Wears hearing aid    both ears    Past Surgical History:  Procedure Laterality Date  . APPENDECTOMY    . COLONOSCOPY    . EYE SURGERY Bilateral     laser, cataract surgery  . KNEE SURGERY  1990   arthroscopic on right knee  . PERIPHERAL VASCULAR CATHETERIZATION N/A 04/10/2016   Procedure: Abdominal Aortogram;  Surgeon: Elam Dutch, MD;   Location: [facility];  Service: Cardiovascular;  Laterality: N/A;  . PERIPHERAL VASCULAR CATHETERIZATION Bilateral 04/10/2016   Procedure: Lower Extremity Angiography;  Surgeon: Elam Dutch, MD;  Location: [facility];  Service: Cardiovascular;  Laterality: Bilateral;  . SHOULDER ARTHROSCOPY WITH ROTATOR CUFF REPAIR AND SUBACROMIAL DECOMPRESSION Right 08/30/2012   Procedure: RIGHT SHOULDER ARTHROSCOPY WITH SUBACROMIAL DECOMPRESSION, DISTAL CLAVICLE RESECTION AND ROTATOR CUFF REPAIR;  Surgeon: Cammie Sickle., MD;  Location: [facility];  Service: Orthopedics;  Laterality: Right;  . TONSILLECTOMY    . vocal cord surg  1992   removed precancerous polyps     Current Outpatient Prescriptions  Medication Sig Dispense Refill  . Artificial Tear Solution (SYSTANE CONTACTS) SOLN Place 1 drop into both eyes 3 (three) times daily.     Marland Kitchen aspirin 81 MG tablet Take 81 mg by mouth daily.    Marland Kitchen atorvastatin (LIPITOR) 20 MG tablet Take 40 mg by mouth daily.     . brimonidine (ALPHAGAN P) 0.1 % SOLN Place 1 drop into both eyes every 8 (eight) hours.     Marland Kitchen donepezil (ARICEPT) 10 MG tablet Take 10 mg by mouth every morning.    . latanoprost (XALATAN) 0.005 % ophthalmic solution  Place 1 drop into the left eye at bedtime.    Marland Kitchen lisinopril (PRINIVIL,ZESTRIL) 10 MG tablet Take 10 mg by mouth daily.    . Multiple Vitamins-Minerals (CENTRUM SILVER 50+MEN PO) Take 1 tablet by mouth daily.    . Multiple Vitamins-Minerals (OCUVITE ADULT 50+ PO) Take 1 tablet by mouth daily.     . Omega-3 Fatty Acids (FISH OIL) 1200 MG CAPS Take 1,200 mg by mouth daily.     . Potassium Gluconate 595 MG CAPS Take 595 mg by mouth daily as needed. Leg cramps    . sildenafil (VIAGRA) 25 MG tablet Take 25 mg by mouth daily as needed for erectile dysfunction.     No current facility-administered medications for this visit.     Allergies:   Patient has no known allergies.    Social History:  The patient   reports that he quit smoking about 19 years ago. His smoking use included Cigarettes. He has never used smokeless tobacco. He reports that he drinks about 8.4 oz of alcohol per week .   Family History:  The patient's family history includes Heart Problems in his mother.    ROS:  Please see the history of present illness.    Review of Systems: Constitutional:  denies fever, chills, diaphoresis, appetite change and fatigue.   He has had some 20 lb weight loss over the past year.   HEENT: denies photophobia, eye pain, redness, hearing loss, ear pain, congestion, sore throat, rhinorrhea, sneezing, neck pain, neck stiffness and tinnitus.  Respiratory: admits to SOB, DOE, cough, chest tightness, and wheezing.  Cardiovascular: admits to chest pain with exertion.   leg swelling.  Gastrointestinal: denies nausea, vomiting, abdominal pain, diarrhea, constipation, blood in stool.  Genitourinary: denies dysuria, urgency, frequency, hematuria, flank pain and difficulty urinating.  Musculoskeletal: denies  myalgias, back pain, joint swelling, arthralgias and gait problem.   Skin: denies pallor, rash and wound.  Neurological: denies dizziness, seizures, syncope, weakness, light-headedness, numbness and headaches.   Hematological: denies adenopathy, easy bruising, personal or family bleeding history.  Psychiatric/ Behavioral: denies suicidal ideation, mood changes, confusion, nervousness, sleep disturbance and agitation.       All other systems are reviewed and negative.    PHYSICAL EXAM: VS:  BP 102/64 (BP Location: Left Arm, Patient Position: Sitting, Cuff Size: Normal)   Pulse 70   Ht 5\' 7"  (1.702 m)   Wt 153 lb (69.4 kg)   BMI 23.96 kg/m  , BMI Body mass index is 23.96 kg/m. GEN: Well nourished, well developed, in no acute distress  HEENT: normal  Neck: no JVD, carotid bruits, or masses Cardiac: RRR; no murmurs, rubs, or gallops,no edema  Respiratory:  clear to auscultation bilaterally,  normal work of breathing GI: soft, nontender, nondistended, + BS MS: no deformity or atrophy  Skin: warm and dry, no rash Neuro:  Strength and sensation are intact Psych: normal   EKG:  EKG is not ordered today. The ekg ordered 04/10/16  demonstrates  NSR at 69.   Normal ECG    Recent Labs: 04/10/2016: BUN 27; Creatinine, Ser 1.40; Hemoglobin 15.3; Potassium 4.4; Sodium 138    Lipid Panel No results found for: CHOL, TRIG, HDL, CHOLHDL, VLDL, LDLCALC, LDLDIRECT    Wt Readings from Last 3 Encounters:  05/01/16 153 lb (69.4 kg)  04/10/16 152 lb (68.9 kg)  03/26/16 151 lb (68.5 kg)      Other studies Reviewed: Additional studies/ records that were reviewed today include: . Review of the  above records demonstrates:    ASSESSMENT AND PLAN:  1.  Preoperative evaluation prior to vessel surgery: Keval presents with extensive vascular history. He's had a right carotid endarterectomy several years ago. He now has been found to have mesenteric ischemia and needs to have mesenteric bypass. He has occasional episodes of chest tightness.  I think that we should proceed with a [place] study for further evaluation. Assuming that the Myoview study is normal then I will see him again in one year. If the Myoview study is abnormal then we will need to see him for cardiac catheterization.  I'll see him again in one year.   Current medicines are reviewed at length with the patient today.  The patient does not have concerns regarding medicines.  Labs/ tests ordered today include:  No orders of the defined types were placed in this encounter.    Disposition:   FU with me in 1 year.       Mertie Moores, MD  05/01/2016 10:09 AM    [address], [place], [place]  [address] Phone: [phone]; Fax: [phone]

## 2016-05-04 ENCOUNTER — Telehealth (HOSPITAL_COMMUNITY): Payer: Self-pay | Admitting: *Deleted

## 2016-05-04 NOTE — Telephone Encounter (Signed)
Patient given detailed instructions per Myocardial Perfusion Study Information Sheet for the test on 04/2016 at 0715. Patient notified to arrive 15 minutes early and that it is imperative to arrive on time for appointment to keep from having the test rescheduled.  If you need to cancel or reschedule your appointment, please call the office within 24 hours of your appointment. Failure to do so may result in a cancellation of your appointment, and a $50 no show fee. Patient verbalized understanding.Arvid Marengo, [place]

## 2016-05-06 ENCOUNTER — Ambulatory Visit (HOSPITAL_COMMUNITY): Payer: Medicare Other | Attending: Internal Medicine

## 2016-05-06 DIAGNOSIS — I739 Peripheral vascular disease, unspecified: Secondary | ICD-10-CM | POA: Diagnosis not present

## 2016-05-06 DIAGNOSIS — Z01818 Encounter for other preprocedural examination: Secondary | ICD-10-CM | POA: Diagnosis not present

## 2016-05-06 LAB — MYOCARDIAL PERFUSION IMAGING
CHL CUP NUCLEAR SDS: 1
CHL CUP RESTING HR STRESS: 66 {beats}/min
LHR: 0.34
LVDIAVOL: 66 mL (ref 62–150)
LVSYSVOL: 24 mL
NUC STRESS TID: 0.89
Peak HR: 81 {beats}/min
SRS: 7
SSS: 8

## 2016-05-06 MED ORDER — TECHNETIUM TC 99M TETROFOSMIN IV KIT
32.8000 | PACK | Freq: Once | INTRAVENOUS | Status: AC | PRN
  Administered 2016-05-06: 32.8 via INTRAVENOUS
  Filled 2016-05-06: qty 33

## 2016-05-06 MED ORDER — REGADENOSON 0.4 MG/5ML IV SOLN
0.4000 mg | Freq: Once | INTRAVENOUS | Status: AC
  Administered 2016-05-06: 0.4 mg via INTRAVENOUS

## 2016-05-06 MED ORDER — TECHNETIUM TC 99M TETROFOSMIN IV KIT
10.7000 | PACK | Freq: Once | INTRAVENOUS | Status: AC | PRN
  Administered 2016-05-06: 10.7 via INTRAVENOUS
  Filled 2016-05-06: qty 11

## 2016-05-07 ENCOUNTER — Encounter: Payer: Self-pay | Admitting: Vascular Surgery

## 2016-05-07 ENCOUNTER — Ambulatory Visit (INDEPENDENT_AMBULATORY_CARE_PROVIDER_SITE_OTHER): Payer: Medicare Other | Admitting: Vascular Surgery

## 2016-05-07 ENCOUNTER — Other Ambulatory Visit: Payer: Self-pay

## 2016-05-07 VITALS — BP 138/90 | HR 71 | Temp 97.4°F | Resp 16 | Ht 67.0 in | Wt 150.0 lb

## 2016-05-07 DIAGNOSIS — K551 Chronic vascular disorders of intestine: Secondary | ICD-10-CM | POA: Diagnosis not present

## 2016-05-07 NOTE — Progress Notes (Signed)
Referring Physician: Silvano Rusk, MD   Patient name: Keith Gibson            MRN: [id number]        DOB: 1940/11/19          Sex: male   REASON FOR CONSULT: Possible chronic mesenteric ischemia   HPI: Keith Gibson is a 75 y.o. male with a two-year history of slow weight loss of 20 pounds. The patient denies any changes in his diet or exercise pattern. He has had some changes in his bowel movement habits. He states he does have some loose stools occasionally and his bowel movements are fairly unpredictable. He denies any postprandial abdominal pain. He states the only time he gets abdominal pain is with spicy foods. He does describe a bandlike or full sensation across his upper portion of his abdomen but does not really know what relates to this symptom. He is a former smoker but quit smoking in 1998. He is on aspirin and a statin. He denies any family history of abdominal aortic aneurysm. He did have a stroke 2 years ago and had a right carotid endarterectomy in [place]. His carotids are currently followed by a doctor at [facility]. He also has a history of erectile dysfunction. Upper endoscopy one month ago showed gastritis and a gastric polyp. Pathology confirmed this. Colonoscopy showed 2 erosions in the cecum which were biopsied. Colon biopsy  showed active colitis. Otherwise normal exam. Other medical problems include COPD, hyperlipidemia, hypertension all of which are currently stable.       Past Medical History:  Diagnosis Date  . Cancer ([place]) throat /1998    precancerous polyps  . Cataract      bil removed  . COPD (chronic obstructive pulmonary disease) ([place])    . Glaucoma    . Hyperlipidemia    . Hypertension    . Personal history of adenomatous  colonic polyps 03/22/2012    Adenomas 2003 and 2005 01/2010 2.5 cm TV adenoma of cecum removed 05/2010 - residual TV adenoma removal   . Shortness of breath    . Wears dentures      full top-partial bottom  . Wears glasses    .  Wears hearing aid      both ears         Past Surgical History:  Procedure Laterality Date  . APPENDECTOMY      . COLONOSCOPY      . EYE SURGERY Bilateral       laser, cataract surgery  . KNEE SURGERY   1990    arthroscopic on right knee  . SHOULDER ARTHROSCOPY WITH ROTATOR CUFF REPAIR AND SUBACROMIAL DECOMPRESSION Right 08/30/2012    Procedure: RIGHT SHOULDER ARTHROSCOPY WITH SUBACROMIAL DECOMPRESSION, DISTAL CLAVICLE RESECTION AND ROTATOR CUFF REPAIR;  Surgeon: Cammie Sickle., MD;  Location: [facility];  Service: Orthopedics;  Laterality: Right;  . TONSILLECTOMY      . vocal cord surg   1992    removed precancerous polyps           Family History  Problem Relation Age of Onset  . Heart Problems Mother    . Colon cancer Neg Hx        SOCIAL HISTORY: Social History         Social History  . Marital status: Married      Spouse name: N/A  . Number of children: 4  . Years of education: N/A  Occupational History  . Not on file.          Social History Main Topics  . Smoking status: Former Smoker      Types: Cigarettes      Quit date: 03/08/1997  . Smokeless tobacco: Never Used  . Alcohol use 8.4 oz/week      14 Cans of beer per week        Comment: occ  . Drug use: Unknown  . Sexual activity: Not on file        Other Topics Concern  . Not on file       Social History Narrative  . No narrative on file      No Known Allergies         Current Outpatient Prescriptions  Medication Sig Dispense Refill  . Artificial Tear Solution (SYSTANE CONTACTS) SOLN Apply to eye.      Marland Kitchen aspirin 81 MG tablet Take 81 mg by mouth daily.      Marland Kitchen atorvastatin (LIPITOR) 20 MG tablet Take 20 mg by mouth daily.      . brimonidine (ALPHAGAN P) 0.1 % SOLN        . donepezil (ARICEPT) 10 MG tablet Take 10 mg by mouth every morning.      Marland Kitchen lisinopril (PRINIVIL,ZESTRIL) 10 MG tablet Take 10 mg by mouth daily.      . Multiple Vitamins-Minerals (OCUVITE ADULT  50+ PO) Take by mouth daily.      . Omega-3 Fatty Acids (FISH OIL) 1000 MG CAPS Take by mouth daily.      . Potassium Gluconate 595 MG CAPS Take by mouth.      . sildenafil (VIAGRA) 25 MG tablet Take 25 mg by mouth daily as needed for erectile dysfunction.                 Current Facility-Administered Medications  Medication Dose Route Frequency Provider Last Rate Last Dose  . 0.9 %  sodium chloride infusion  500 mL Intravenous Continuous Gatha Mayer, MD          ROS:    General:  + weight loss, No Fever, chills   HEENT: No recent headaches, no nasal bleeding, no visual changes, no sore throat   Neurologic: No dizziness, blackouts, seizures. No recent symptoms of stroke or mini- stroke. No recent episodes of slurred speech, or temporary blindness.   Cardiac: No recent episodes of chest pain/pressure, no shortness of breath at rest.  + shortness of breath with exertion.  Denies history of atrial fibrillation or irregular heartbeat   Vascular: No history of rest pain in feet.  No history of claudication.  No history of non-healing ulcer, No history of DVT    Pulmonary: No home oxygen, no productive cough, no hemoptysis,  No asthma or wheezing   Musculoskeletal:  [ ]  Arthritis, [ ]  Low back pain,  [ ]  Joint pain   Hematologic:No history of hypercoagulable state.  No history of easy bleeding.  No history of anemia   Gastrointestinal: No hematochezia or melena,  No gastroesophageal reflux, no trouble swallowing   Urinary: [ ]  chronic Kidney disease, [ ]  on HD - [ ]  MWF or [ ]  TTHS, [ ]  Burning with urination, [ ]  Frequent urination, [ ]  Difficulty urinating;    Skin: No rashes   Psychological: No history of anxiety,  No history of depression     Physical Examination   Vitals:   05/07/16 1356  BP: 138/90  Pulse:  71  Resp: 16  Temp: 97.4 F (36.3 C)  TempSrc: Oral  SpO2: 97%  Weight: 150 lb (68 kg)  Height: 5\' 7"  (1.702 m)     General:  Alert and oriented, no acute  distress HEENT: Normal Neck: No bruit or JVD, well healed right neck scar Pulmonary: Clear to auscultation bilaterally Cardiac: Regular Rate and Rhythm without murmur Abdomen: Soft, non-tender, non-distended, no mass, no bruit  Skin: No rash Extremity Pulses:  2+ radial, brachial, femoral pulses bilaterally Musculoskeletal: No deformity or edema      Neurologic: Upper and lower extremity motor 5/5 and symmetric   DATA:  Patient has CT Angio the abdomen and pelvis dated 03/05/2016. I reviewed his findings today. This showed calcification of the infrarenal and suprarenal aorta. There is a potential 70% narrowing of the proximals superior Mesenteric artery. Possible 50% narrowing of the celiac artery. Patent IMA. There is some calcification at the origins of the renal arteries as well as the left common iliac artery as well. There were also numerous cysts in the liver. There was also some fullness of the pancreatic head.  Angio 11/24                                #1  90% stenosis celiac origin heavily calcified                                 #2  75-80% stenosis severely calcified superior mesenteric artery                                 #3  80% stenosis heavily calcified lesion left renal artery                                 #4  50% heavily calcified stenosis left common iliac artery                                 #5 bilateral 50-70% heavily calcified common femoral artery                                #6 intact bilateral lower extremity runoff 3 vessels left one-vessel right (posterior tibial)    ASSESSMENT:  Patient with symptoms not classical but 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 He also has findings on his CT Angio which suggests multi vessel mesenteric occlusive disease. He has diffuse atherosclerotic changes of his supra and infrarenal abdominal aorta. 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 Although he has some evidence of left iliac stenosis on CT scan he has palpable pedal pulses and exhibits no claudication symptoms.  All of these findings were confirmed on recent arteriogram with a 90% stenosis of the celiac 80% stenosis of the superior mesenteric artery.     PLAN:   aorto superior mesenteric and celiac artery bypass scheduled for 06/02/2015. Risks benefits possible complications and procedure details were discussed with the patient patient several children and his wife today. They understand and agree to proceed.  Possible complications including but not limited to bleeding, infection, death, gut infarction.     [name], MD [facility] Office: [phone] Pager: [phone]

## 2016-05-18 DIAGNOSIS — T8859XA Other complications of anesthesia, initial encounter: Secondary | ICD-10-CM

## 2016-05-18 HISTORY — DX: Other complications of anesthesia, initial encounter: T88.59XA

## 2016-05-27 ENCOUNTER — Encounter (HOSPITAL_COMMUNITY): Payer: Self-pay

## 2016-05-27 ENCOUNTER — Encounter (HOSPITAL_COMMUNITY)
Admission: RE | Admit: 2016-05-27 | Discharge: 2016-05-27 | Disposition: A | Discharge status: Home / Self Care | Payer: Medicare Other | Source: Ambulatory Visit | Attending: Vascular Surgery | Admitting: Vascular Surgery

## 2016-05-27 DIAGNOSIS — Z79899 Other long term (current) drug therapy: Secondary | ICD-10-CM | POA: Insufficient documentation

## 2016-05-27 DIAGNOSIS — Z01812 Encounter for preprocedural laboratory examination: Secondary | ICD-10-CM | POA: Diagnosis not present

## 2016-05-27 DIAGNOSIS — Z87891 Personal history of nicotine dependence: Secondary | ICD-10-CM | POA: Insufficient documentation

## 2016-05-27 DIAGNOSIS — E785 Hyperlipidemia, unspecified: Secondary | ICD-10-CM | POA: Diagnosis not present

## 2016-05-27 DIAGNOSIS — I10 Essential (primary) hypertension: Secondary | ICD-10-CM | POA: Diagnosis not present

## 2016-05-27 HISTORY — DX: Other amnesia: R41.3

## 2016-05-27 HISTORY — DX: Cerebral infarction, unspecified: I63.9

## 2016-05-27 LAB — CBC
HCT: 42 % (ref 39.0–52.0)
HEMOGLOBIN: 14.5 g/dL (ref 13.0–17.0)
MCH: 31.5 pg (ref 26.0–34.0)
MCHC: 34.5 g/dL (ref 30.0–36.0)
MCV: 91.3 fL (ref 78.0–100.0)
Platelets: 192 10*3/uL (ref 150–400)
RBC: 4.6 MIL/uL (ref 4.22–5.81)
RDW: 13.1 % (ref 11.5–15.5)
WBC: 6.9 10*3/uL (ref 4.0–10.5)

## 2016-05-27 LAB — BLOOD GAS, ARTERIAL
Acid-Base Excess: 0.4 mmol/L (ref 0.0–2.0)
Bicarbonate: 24 mmol/L (ref 20.0–28.0)
Drawn by: 470591
FIO2: 21
O2 Saturation: 96.1 %
PCO2 ART: 35.5 mmHg (ref 32.0–48.0)
PH ART: 7.445 (ref 7.350–7.450)
Patient temperature: 98.6
pO2, Arterial: 81.1 mmHg — ABNORMAL LOW (ref 83.0–108.0)

## 2016-05-27 LAB — SURGICAL PCR SCREEN
MRSA, PCR: NEGATIVE
Staphylococcus aureus: NEGATIVE

## 2016-05-27 LAB — URINALYSIS, ROUTINE W REFLEX MICROSCOPIC
Bilirubin Urine: NEGATIVE
GLUCOSE, UA: NEGATIVE mg/dL
Hgb urine dipstick: NEGATIVE
Ketones, ur: NEGATIVE mg/dL
LEUKOCYTES UA: NEGATIVE
Nitrite: NEGATIVE
PROTEIN: NEGATIVE mg/dL
Specific Gravity, Urine: 1.021 (ref 1.005–1.030)
pH: 5 (ref 5.0–8.0)

## 2016-05-27 LAB — APTT: APTT: 29 s (ref 24–36)

## 2016-05-27 LAB — COMPREHENSIVE METABOLIC PANEL
ALK PHOS: 67 U/L (ref 38–126)
ALT: 20 U/L (ref 17–63)
AST: 27 U/L (ref 15–41)
Albumin: 3.5 g/dL (ref 3.5–5.0)
Anion gap: 7 (ref 5–15)
BUN: 19 mg/dL (ref 6–20)
CALCIUM: 8.7 mg/dL — AB (ref 8.9–10.3)
CO2: 23 mmol/L (ref 22–32)
CREATININE: 1.23 mg/dL (ref 0.61–1.24)
Chloride: 108 mmol/L (ref 101–111)
GFR, EST NON AFRICAN AMERICAN: 55 mL/min — AB (ref >60)
Glucose, Bld: 96 mg/dL (ref 65–99)
Potassium: 4.5 mmol/L (ref 3.5–5.1)
Sodium: 138 mmol/L (ref 135–145)
Total Bilirubin: 1.1 mg/dL (ref 0.3–1.2)
Total Protein: 6.1 g/dL — ABNORMAL LOW (ref 6.5–8.1)

## 2016-05-27 LAB — PROTIME-INR
INR: 0.96
PROTHROMBIN TIME: 12.8 s (ref 11.4–15.2)

## 2016-05-27 LAB — ABO/RH: ABO/RH(D): B POS

## 2016-05-27 NOTE — Progress Notes (Signed)
PCP - [facility] [place] Cardiologist - [place]  Chest x-ray - not needed EKG - 04/10/16 Stress Test - 05/06/16 ECHO - denies Cardiac Cath - 04/10/16 - peripheral    Patient denies shortness of breath, fever, cough and chest pain at PAT appointment

## 2016-05-27 NOTE — Pre-Procedure Instructions (Signed)
Keith Gibson  05/27/2016      [id number] - [place] Gibson, [place] [address] Phone: [phone] Fax: [phone]    Your procedure is scheduled on January 15  Report to [facility] at [place].M.  Call this number if you have problems the morning of surgery:  [phone]   Remember:  Do not eat food or drink liquids after midnight.   Take these medicines the morning of surgery with A SIP OF WATER eye drops if needed, donepezil (ARICEPT)  7 days prior to surgery STOP taking any Aspirin, Aleve, Naproxen, Ibuprofen, Motrin, Advil, Goody's, BC's, all herbal medications, fish oil, and all vitamins    Do not wear jewelry.  Do not wear lotions, powders, or cologne, or deoderant.  Men may shave face and neck.  Do not bring valuables to the hospital.  [facility] is not responsible for any belongings or valuables.  Contacts, dentures or bridgework may not be worn into surgery.  Leave your suitcase in the car.  After surgery it may be brought to your room.  For patients admitted to the hospital, discharge time will be determined by your treatment team.  Patients discharged the day of surgery will not be allowed to drive home.    Special instructions:   Keith Gibson- Preparing For Surgery  Before surgery, you can play an important role. Because skin is not sterile, your skin needs to be as free of germs as possible. You can reduce the number of germs on your skin by washing with CHG (chlorahexidine gluconate) Soap before surgery.  CHG is an antiseptic cleaner which kills germs and bonds with the skin to continue killing germs even after washing.  Please do not use if you have an allergy to CHG or antibacterial soaps. If your skin becomes reddened/irritated stop using the CHG.  Do not shave (including legs and underarms) for at least 48 hours prior to first CHG shower. It is OK to shave  your face.  Please follow these instructions carefully.   1. Shower the NIGHT BEFORE SURGERY and the MORNING OF SURGERY with CHG.   2. If you chose to wash your hair, wash your hair first as usual with your normal shampoo.  3. After you shampoo, rinse your hair and body thoroughly to remove the shampoo.  4. Use CHG as you would any other liquid soap. You can apply CHG directly to the skin and wash gently with a scrungie or a clean washcloth.   5. Apply the CHG Soap to your body ONLY FROM THE NECK DOWN.  Do not use on open wounds or open sores. Avoid contact with your eyes, ears, mouth and genitals (private parts). Wash genitals (private parts) with your normal soap.  6. Wash thoroughly, paying special attention to the area where your surgery will be performed.  7. Thoroughly rinse your body with warm water from the neck down.  8. DO NOT shower/wash with your normal soap after using and rinsing off the CHG Soap.  9. Pat yourself dry with a CLEAN TOWEL.   10. Wear CLEAN PAJAMAS   11. Place CLEAN SHEETS on your bed the night of your first shower and DO NOT SLEEP WITH PETS.    Day of Surgery: Do not apply any deodorants/lotions. Please wear clean clothes to the hospital/surgery center.      Please read over the following fact sheets that  you were given.

## 2016-05-28 ENCOUNTER — Encounter (HOSPITAL_COMMUNITY): Payer: Self-pay

## 2016-05-28 NOTE — Progress Notes (Signed)
Anesthesia Chart Review:  Pt is a 76 year old male scheduled for aorto superior mesenteric and celiac artery bypass on 06/01/2016 with [name], MD.   - PCP is Daiva Eves, MD - Cardiologist is Mertie Moores, MD, last office visit 05/01/16  PMH includes:  HTN, hyperlipidemia, glaucoma, storke (2015), carotid stenosis (s/p R CEA 05/08/14), COPD, memory loss. Former smoker. BMI 24. S/p R shoulder arthroscopy 08/30/12.   Medications include: ASA, lipitor, aricept, lisinopril, viagra  Preoperative labs reviewed.    EKG 04/10/16: NSR  Nuclear stress test 05/06/16:   Nuclear stress EF: 63%.  There was no ST segment deviation noted during stress.  The study is normal.  This is a low risk study.  The left ventricular ejection fraction is normal (55-65%). Normal pharmacologic nuclear stress test with no evidence of prior infarct or ischemia.   Carotid US 09/26/15 (care everywhere): no significant stenotic flow demonstrated bilaterally  If no changes, I anticipate pt can proceed with surgery as scheduled.   Willeen Cass, FNP-BC [facility]/Anesthesiology Phone: [phone] 05/28/2016 1:56 PM

## 2016-05-31 NOTE — Anesthesia Preprocedure Evaluation (Addendum)
Anesthesia Evaluation  Patient identified by MRN, date of birth, ID band Patient awake    Reviewed: Allergy & Precautions, H&P , NPO status , Patient's Chart, lab work & pertinent test results  History of Anesthesia Complications Negative for: history of anesthetic complications  Airway Mallampati: II  TM Distance: >3 FB Neck ROM: Full    Dental  (+) Edentulous Upper, Partial Lower, Dental Advisory Given   Pulmonary shortness of breath, COPD, former smoker,    Pulmonary exam normal - rhonchi (-) decreased breath sounds      Cardiovascular hypertension, + Peripheral Vascular Disease  Normal cardiovascular exam  Study Highlights     Nuclear stress EF: 63%.  There was no ST segment deviation noted during stress.  The study is normal.  This is a low risk study.  The left ventricular ejection fraction is normal (55-65%).   Normal pharmacologic nuclear stress test with no evidence of prior infarct or ischemia.       Neuro/Psych negative neurological ROS     GI/Hepatic negative GI ROS, Neg liver ROS,   Endo/Other  negative endocrine ROS  Renal/GU negative Renal ROS     Musculoskeletal   Abdominal   Peds  Hematology   Anesthesia Other Findings   Reproductive/Obstetrics                           Anesthesia Physical  Anesthesia Plan  ASA: III  Anesthesia Plan: General   Post-op Pain Management:    Induction: Intravenous  Airway Management Planned: Oral ETT  Additional Equipment: Arterial line and CVP  Intra-op Plan:   Post-operative Plan: Post-operative intubation/ventilation  Informed Consent: I have reviewed the patients History and Physical, chart, labs and discussed the procedure including the risks, benefits and alternatives for the proposed anesthesia with the patient or authorized representative who has indicated his/her understanding and acceptance.   Dental  advisory given  Plan Discussed with: CRNA, Surgeon and Anesthesiologist  Anesthesia Plan Comments:        Anesthesia Quick Evaluation

## 2016-06-01 ENCOUNTER — Inpatient Hospital Stay (HOSPITAL_COMMUNITY): Payer: Medicare Other | Admitting: Emergency Medicine

## 2016-06-01 ENCOUNTER — Inpatient Hospital Stay (HOSPITAL_COMMUNITY)
Admission: RE | Admit: 2016-06-01 | Discharge: 2016-06-15 | DRG: 357 | Disposition: A | Discharge status: Home Health | Payer: Medicare Other | Source: Ambulatory Visit | Attending: Vascular Surgery | Admitting: Vascular Surgery

## 2016-06-01 ENCOUNTER — Inpatient Hospital Stay (HOSPITAL_COMMUNITY): Payer: Medicare Other | Admitting: Certified Registered"

## 2016-06-01 ENCOUNTER — Inpatient Hospital Stay (HOSPITAL_COMMUNITY): Payer: Medicare Other

## 2016-06-01 ENCOUNTER — Encounter (HOSPITAL_COMMUNITY): Payer: Self-pay | Admitting: *Deleted

## 2016-06-01 ENCOUNTER — Encounter (HOSPITAL_COMMUNITY)
Admission: RE | Disposition: A | Discharge status: Home Health | Payer: Self-pay | Source: Ambulatory Visit | Attending: Vascular Surgery

## 2016-06-01 DIAGNOSIS — R451 Restlessness and agitation: Secondary | ICD-10-CM | POA: Diagnosis not present

## 2016-06-01 DIAGNOSIS — F05 Delirium due to known physiological condition: Secondary | ICD-10-CM | POA: Diagnosis not present

## 2016-06-01 DIAGNOSIS — I509 Heart failure, unspecified: Secondary | ICD-10-CM | POA: Diagnosis present

## 2016-06-01 DIAGNOSIS — D62 Acute posthemorrhagic anemia: Secondary | ICD-10-CM | POA: Diagnosis not present

## 2016-06-01 DIAGNOSIS — E876 Hypokalemia: Secondary | ICD-10-CM | POA: Diagnosis present

## 2016-06-01 DIAGNOSIS — R109 Unspecified abdominal pain: Secondary | ICD-10-CM | POA: Diagnosis present

## 2016-06-01 DIAGNOSIS — R410 Disorientation, unspecified: Secondary | ICD-10-CM | POA: Diagnosis not present

## 2016-06-01 DIAGNOSIS — Z79899 Other long term (current) drug therapy: Secondary | ICD-10-CM | POA: Diagnosis not present

## 2016-06-01 DIAGNOSIS — J449 Chronic obstructive pulmonary disease, unspecified: Secondary | ICD-10-CM | POA: Diagnosis present

## 2016-06-01 DIAGNOSIS — J9811 Atelectasis: Secondary | ICD-10-CM | POA: Diagnosis not present

## 2016-06-01 DIAGNOSIS — N390 Urinary tract infection, site not specified: Secondary | ICD-10-CM | POA: Diagnosis not present

## 2016-06-01 DIAGNOSIS — Z9841 Cataract extraction status, right eye: Secondary | ICD-10-CM | POA: Diagnosis not present

## 2016-06-01 DIAGNOSIS — Z8673 Personal history of transient ischemic attack (TIA), and cerebral infarction without residual deficits: Secondary | ICD-10-CM | POA: Diagnosis not present

## 2016-06-01 DIAGNOSIS — K559 Vascular disorder of intestine, unspecified: Secondary | ICD-10-CM | POA: Diagnosis present

## 2016-06-01 DIAGNOSIS — Z9842 Cataract extraction status, left eye: Secondary | ICD-10-CM

## 2016-06-01 DIAGNOSIS — Z7982 Long term (current) use of aspirin: Secondary | ICD-10-CM

## 2016-06-01 DIAGNOSIS — R262 Difficulty in walking, not elsewhere classified: Secondary | ICD-10-CM

## 2016-06-01 DIAGNOSIS — E785 Hyperlipidemia, unspecified: Secondary | ICD-10-CM | POA: Diagnosis present

## 2016-06-01 DIAGNOSIS — K551 Chronic vascular disorders of intestine: Secondary | ICD-10-CM | POA: Diagnosis present

## 2016-06-01 DIAGNOSIS — Z87891 Personal history of nicotine dependence: Secondary | ICD-10-CM

## 2016-06-01 DIAGNOSIS — H409 Unspecified glaucoma: Secondary | ICD-10-CM | POA: Diagnosis present

## 2016-06-01 DIAGNOSIS — I10 Essential (primary) hypertension: Secondary | ICD-10-CM | POA: Diagnosis present

## 2016-06-01 DIAGNOSIS — I701 Atherosclerosis of renal artery: Secondary | ICD-10-CM | POA: Diagnosis present

## 2016-06-01 DIAGNOSIS — M545 Low back pain: Secondary | ICD-10-CM | POA: Diagnosis not present

## 2016-06-01 DIAGNOSIS — F0390 Unspecified dementia without behavioral disturbance: Secondary | ICD-10-CM | POA: Diagnosis present

## 2016-06-01 DIAGNOSIS — K55059 Acute (reversible) ischemia of intestine, part and extent unspecified: Secondary | ICD-10-CM

## 2016-06-01 DIAGNOSIS — Z9889 Other specified postprocedural states: Secondary | ICD-10-CM

## 2016-06-01 DIAGNOSIS — N529 Male erectile dysfunction, unspecified: Secondary | ICD-10-CM | POA: Diagnosis present

## 2016-06-01 DIAGNOSIS — I110 Hypertensive heart disease with heart failure: Secondary | ICD-10-CM | POA: Diagnosis present

## 2016-06-01 DIAGNOSIS — G9340 Encephalopathy, unspecified: Secondary | ICD-10-CM | POA: Diagnosis not present

## 2016-06-01 DIAGNOSIS — R0981 Nasal congestion: Secondary | ICD-10-CM | POA: Diagnosis not present

## 2016-06-01 DIAGNOSIS — R5381 Other malaise: Secondary | ICD-10-CM | POA: Diagnosis not present

## 2016-06-01 HISTORY — PX: MESENTERIC ARTERY BYPASS: SHX5968

## 2016-06-01 LAB — POCT I-STAT 7, (LYTES, BLD GAS, ICA,H+H)
ACID-BASE DEFICIT: 3 mmol/L — AB (ref 0.0–2.0)
ACID-BASE DEFICIT: 3 mmol/L — AB (ref 0.0–2.0)
Acid-base deficit: 1 mmol/L (ref 0.0–2.0)
BICARBONATE: 22.8 mmol/L (ref 20.0–28.0)
BICARBONATE: 25.2 mmol/L (ref 20.0–28.0)
Bicarbonate: 22.3 mmol/L (ref 20.0–28.0)
CALCIUM ION: 1.1 mmol/L — AB (ref 1.15–1.40)
Calcium, Ion: 1.18 mmol/L (ref 1.15–1.40)
Calcium, Ion: 1.07 mmol/L — ABNORMAL LOW (ref 1.15–1.40)
HCT: 34 % — ABNORMAL LOW (ref 39.0–52.0)
HCT: 28 % — ABNORMAL LOW (ref 39.0–52.0)
HEMATOCRIT: 28 % — AB (ref 39.0–52.0)
HEMOGLOBIN: 9.5 g/dL — AB (ref 13.0–17.0)
Hemoglobin: 11.6 g/dL — ABNORMAL LOW (ref 13.0–17.0)
Hemoglobin: 9.5 g/dL — ABNORMAL LOW (ref 13.0–17.0)
O2 SAT: 99 %
O2 Saturation: 97 %
O2 Saturation: 99 %
PCO2 ART: 38.4 mmHg (ref 32.0–48.0)
PH ART: 7.373 (ref 7.350–7.450)
PO2 ART: 85 mmHg (ref 83.0–108.0)
POTASSIUM: 4.2 mmol/L (ref 3.5–5.1)
POTASSIUM: 3.8 mmol/L (ref 3.5–5.1)
Patient temperature: 34.3
Potassium: 4.1 mmol/L (ref 3.5–5.1)
SODIUM: 135 mmol/L (ref 135–145)
SODIUM: 139 mmol/L (ref 135–145)
Sodium: 140 mmol/L (ref 135–145)
TCO2: 24 mmol/L (ref 0–100)
TCO2: 23 mmol/L (ref 0–100)
TCO2: 27 mmol/L (ref 0–100)
pCO2 arterial: 37.4 mmHg (ref 32.0–48.0)
pCO2 arterial: 41.9 mmHg (ref 32.0–48.0)
pH, Arterial: 7.373 (ref 7.350–7.450)
pH, Arterial: 7.374 (ref 7.350–7.450)
pO2, Arterial: 164 mmHg — ABNORMAL HIGH (ref 83.0–108.0)
pO2, Arterial: 122 mmHg — ABNORMAL HIGH (ref 83.0–108.0)

## 2016-06-01 LAB — BLOOD GAS, ARTERIAL
Acid-base deficit: 0.3 mmol/L (ref 0.0–2.0)
Bicarbonate: 25.2 mmol/L (ref 20.0–28.0)
O2 SAT: 92.3 %
PATIENT TEMPERATURE: 98.6
PH ART: 7.307 — AB (ref 7.350–7.450)
PO2 ART: 74.4 mmHg — AB (ref 83.0–108.0)
pCO2 arterial: 51.9 mmHg — ABNORMAL HIGH (ref 32.0–48.0)

## 2016-06-01 LAB — BASIC METABOLIC PANEL
ANION GAP: 7 (ref 5–15)
BUN: 17 mg/dL (ref 6–20)
CHLORIDE: 105 mmol/L (ref 101–111)
CO2: 24 mmol/L (ref 22–32)
Calcium: 7.8 mg/dL — ABNORMAL LOW (ref 8.9–10.3)
Creatinine, Ser: 1.01 mg/dL (ref 0.61–1.24)
GFR calc Af Amer: >60 mL/min (ref >60)
GFR calc non Af Amer: >60 mL/min (ref >60)
Glucose, Bld: 159 mg/dL — ABNORMAL HIGH (ref 65–99)
POTASSIUM: 4.3 mmol/L (ref 3.5–5.1)
SODIUM: 136 mmol/L (ref 135–145)

## 2016-06-01 LAB — PROTIME-INR
INR: 1.33
Prothrombin Time: 16.6 seconds — ABNORMAL HIGH (ref 11.4–15.2)

## 2016-06-01 LAB — CBC
HCT: 37.2 % — ABNORMAL LOW (ref 39.0–52.0)
HEMOGLOBIN: 12.7 g/dL — AB (ref 13.0–17.0)
MCH: 31.5 pg (ref 26.0–34.0)
MCHC: 34.1 g/dL (ref 30.0–36.0)
MCV: 92.3 fL (ref 78.0–100.0)
Platelets: 153 10*3/uL (ref 150–400)
RBC: 4.03 MIL/uL — AB (ref 4.22–5.81)
RDW: 13.1 % (ref 11.5–15.5)
WBC: 17 10*3/uL — AB (ref 4.0–10.5)

## 2016-06-01 LAB — PREPARE RBC (CROSSMATCH)

## 2016-06-01 LAB — APTT: APTT: 33 s (ref 24–36)

## 2016-06-01 LAB — MAGNESIUM: MAGNESIUM: 1.6 mg/dL — AB (ref 1.7–2.4)

## 2016-06-01 SURGERY — CREATION, BYPASS, ARTERIAL, MESENTERIC
Anesthesia: General | Site: Abdomen

## 2016-06-01 MED ORDER — HYDROMORPHONE HCL 1 MG/ML IJ SOLN
INTRAMUSCULAR | Status: AC
  Filled 2016-06-01: qty 1

## 2016-06-01 MED ORDER — NITROGLYCERIN IN D5W 200-5 MCG/ML-% IV SOLN
5.0000 ug/min | INTRAVENOUS | Status: DC
  Administered 2016-06-01: 10 ug/min via INTRAVENOUS
  Filled 2016-06-01: qty 250

## 2016-06-01 MED ORDER — PHENYLEPHRINE 40 MCG/ML (10ML) SYRINGE FOR IV PUSH (FOR BLOOD PRESSURE SUPPORT)
PREFILLED_SYRINGE | INTRAVENOUS | Status: AC
  Filled 2016-06-01: qty 10

## 2016-06-01 MED ORDER — DOCUSATE SODIUM 100 MG PO CAPS
100.0000 mg | ORAL_CAPSULE | Freq: Every day | ORAL | Status: DC
  Administered 2016-06-06 – 2016-06-14 (×7): 100 mg via ORAL
  Filled 2016-06-01 (×11): qty 1

## 2016-06-01 MED ORDER — ACETAMINOPHEN 325 MG PO TABS
325.0000 mg | ORAL_TABLET | ORAL | Status: DC | PRN
  Administered 2016-06-04: 325 mg via ORAL
  Administered 2016-06-09 – 2016-06-14 (×5): 650 mg via ORAL
  Filled 2016-06-01 (×6): qty 2

## 2016-06-01 MED ORDER — LIDOCAINE HCL (CARDIAC) 20 MG/ML IV SOLN
INTRAVENOUS | Status: DC | PRN
  Administered 2016-06-01: 80 mg via INTRAVENOUS

## 2016-06-01 MED ORDER — CHLORHEXIDINE GLUCONATE CLOTH 2 % EX PADS: 6.0000 | MEDICATED_PAD | Freq: Once | CUTANEOUS | Status: DC

## 2016-06-01 MED ORDER — CHLORHEXIDINE GLUCONATE 0.12 % MT SOLN
15.0000 mL | Freq: Two times a day (BID) | OROMUCOSAL | Status: DC
  Administered 2016-06-01 – 2016-06-08 (×15): 15 mL via OROMUCOSAL
  Filled 2016-06-01 (×15): qty 15

## 2016-06-01 MED ORDER — FENTANYL CITRATE (PF) 100 MCG/2ML IJ SOLN
INTRAMUSCULAR | Status: DC | PRN
  Administered 2016-06-01: 50 ug via INTRAVENOUS
  Administered 2016-06-01: 100 ug via INTRAVENOUS
  Administered 2016-06-01 (×2): 50 ug via INTRAVENOUS
  Administered 2016-06-01: 100 ug via INTRAVENOUS
  Administered 2016-06-01 (×3): 50 ug via INTRAVENOUS

## 2016-06-01 MED ORDER — DEXTROSE 5 % IV SOLN
1.5000 g | INTRAVENOUS | Status: AC
  Administered 2016-06-01: 1.5 g via INTRAVENOUS
  Filled 2016-06-01: qty 1.5

## 2016-06-01 MED ORDER — ORAL CARE MOUTH RINSE
15.0000 mL | Freq: Two times a day (BID) | OROMUCOSAL | Status: DC
  Administered 2016-06-01 – 2016-06-08 (×10): 15 mL via OROMUCOSAL

## 2016-06-01 MED ORDER — HYDROMORPHONE HCL 1 MG/ML IJ SOLN
0.2500 mg | INTRAMUSCULAR | Status: DC | PRN
  Administered 2016-06-01 (×3): 0.5 mg via INTRAVENOUS

## 2016-06-01 MED ORDER — MORPHINE SULFATE (PF) 2 MG/ML IV SOLN: 2.0000 mg | INTRAVENOUS | Status: DC | PRN

## 2016-06-01 MED ORDER — PANTOPRAZOLE SODIUM 40 MG PO TBEC
40.0000 mg | DELAYED_RELEASE_TABLET | Freq: Every day | ORAL | Status: DC
  Administered 2016-06-06 – 2016-06-15 (×10): 40 mg via ORAL
  Filled 2016-06-01 (×12): qty 1

## 2016-06-01 MED ORDER — SODIUM CHLORIDE 0.9 % IR SOLN
Status: DC | PRN
Start: 2016-06-01 — End: 2016-06-01
  Administered 2016-06-01: 3000 mL

## 2016-06-01 MED ORDER — BRIMONIDINE TARTRATE 0.15 % OP SOLN
1.0000 [drp] | Freq: Three times a day (TID) | OPHTHALMIC | Status: DC
  Administered 2016-06-01 – 2016-06-15 (×39): 1 [drp] via OPHTHALMIC
  Filled 2016-06-01 (×2): qty 5

## 2016-06-01 MED ORDER — BISACODYL 10 MG RE SUPP: 10.0000 mg | Freq: Every day | RECTAL | Status: DC | PRN

## 2016-06-01 MED ORDER — PHENYLEPHRINE HCL 10 MG/ML IJ SOLN
INTRAVENOUS | Status: DC | PRN
  Administered 2016-06-01: 55 ug/min via INTRAVENOUS

## 2016-06-01 MED ORDER — GUAIFENESIN-DM 100-10 MG/5ML PO SYRP: 15.0000 mL | ORAL_SOLUTION | ORAL | Status: DC | PRN

## 2016-06-01 MED ORDER — HYDRALAZINE HCL 20 MG/ML IJ SOLN
5.0000 mg | INTRAMUSCULAR | Status: AC | PRN
  Administered 2016-06-06 – 2016-06-09 (×2): 5 mg via INTRAVENOUS
  Filled 2016-06-01: qty 1
  Filled 2016-06-01: qty 0.25
  Filled 2016-06-01: qty 1

## 2016-06-01 MED ORDER — FENTANYL CITRATE (PF) 100 MCG/2ML IJ SOLN
INTRAMUSCULAR | Status: AC
  Filled 2016-06-01: qty 4

## 2016-06-01 MED ORDER — SCOPOLAMINE 1 MG/3DAYS TD PT72
1.0000 | MEDICATED_PATCH | TRANSDERMAL | Status: DC
  Administered 2016-06-01: 1.5 mg via TRANSDERMAL
  Filled 2016-06-01: qty 1

## 2016-06-01 MED ORDER — HEPARIN SODIUM (PORCINE) 1000 UNIT/ML IJ SOLN
INTRAMUSCULAR | Status: DC | PRN
  Administered 2016-06-01: 7000 [IU] via INTRAVENOUS
  Administered 2016-06-01: 2000 [IU] via INTRAVENOUS

## 2016-06-01 MED ORDER — ONDANSETRON HCL 4 MG/2ML IJ SOLN: 4.0000 mg | Freq: Four times a day (QID) | INTRAMUSCULAR | Status: DC | PRN

## 2016-06-01 MED ORDER — PROTAMINE SULFATE 10 MG/ML IV SOLN
INTRAVENOUS | Status: AC
  Filled 2016-06-01: qty 5

## 2016-06-01 MED ORDER — HEPARIN SODIUM (PORCINE) 1000 UNIT/ML IJ SOLN
INTRAMUSCULAR | Status: AC
  Filled 2016-06-01: qty 1

## 2016-06-01 MED ORDER — POTASSIUM CHLORIDE CRYS ER 20 MEQ PO TBCR: 20.0000 meq | EXTENDED_RELEASE_TABLET | Freq: Once | ORAL | Status: DC | PRN

## 2016-06-01 MED ORDER — SODIUM CHLORIDE 0.9 % IV SOLN
INTRAVENOUS | Status: DC | PRN
  Administered 2016-06-01: 09:00:00

## 2016-06-01 MED ORDER — SUGAMMADEX SODIUM 200 MG/2ML IV SOLN
INTRAVENOUS | Status: DC | PRN
  Administered 2016-06-01: 400 mg via INTRAVENOUS

## 2016-06-01 MED ORDER — SYSTANE CONTACTS OP SOLN: 1.0000 [drp] | Freq: Three times a day (TID) | OPHTHALMIC | Status: DC

## 2016-06-01 MED ORDER — PANTOPRAZOLE SODIUM 40 MG IV SOLR
40.0000 mg | Freq: Every day | INTRAVENOUS | Status: DC
  Administered 2016-06-01 – 2016-06-05 (×5): 40 mg via INTRAVENOUS
  Filled 2016-06-01 (×5): qty 40

## 2016-06-01 MED ORDER — SODIUM CHLORIDE 0.9 % IJ SOLN
INTRAMUSCULAR | Status: AC
  Filled 2016-06-01: qty 10

## 2016-06-01 MED ORDER — MANNITOL 20 % IV SOLN
50.0000 g | Freq: Once | Status: DC
  Filled 2016-06-01 (×2): qty 250

## 2016-06-01 MED ORDER — LACTATED RINGERS IV SOLN
INTRAVENOUS | Status: DC | PRN
  Administered 2016-06-01 (×3): via INTRAVENOUS

## 2016-06-01 MED ORDER — LACTATED RINGERS IV SOLN
INTRAVENOUS | Status: DC | PRN
  Administered 2016-06-01 (×2): via INTRAVENOUS

## 2016-06-01 MED ORDER — MIDAZOLAM HCL 5 MG/5ML IJ SOLN
INTRAMUSCULAR | Status: DC | PRN
  Administered 2016-06-01: 2 mg via INTRAVENOUS

## 2016-06-01 MED ORDER — DEXTROSE-NACL 5-0.45 % IV SOLN
INTRAVENOUS | Status: DC
  Administered 2016-06-01 – 2016-06-06 (×7): via INTRAVENOUS
  Administered 2016-06-08: 50 mL/h via INTRAVENOUS
  Administered 2016-06-10 (×2): via INTRAVENOUS

## 2016-06-01 MED ORDER — FENTANYL CITRATE (PF) 100 MCG/2ML IJ SOLN
INTRAMUSCULAR | Status: AC
  Filled 2016-06-01: qty 2

## 2016-06-01 MED ORDER — ONDANSETRON HCL 4 MG/2ML IJ SOLN
INTRAMUSCULAR | Status: DC | PRN
  Administered 2016-06-01: 4 mg via INTRAVENOUS

## 2016-06-01 MED ORDER — ONDANSETRON HCL 4 MG/2ML IJ SOLN
INTRAMUSCULAR | Status: AC
  Filled 2016-06-01: qty 2

## 2016-06-01 MED ORDER — MANNITOL 25 % IV SOLN
50.0000 g | Freq: Once | INTRAVENOUS | Status: AC
  Administered 2016-06-01: 25 g via INTRAVENOUS
  Filled 2016-06-01: qty 200

## 2016-06-01 MED ORDER — ACETAMINOPHEN 325 MG RE SUPP
325.0000 mg | RECTAL | Status: DC | PRN
Start: 2016-06-01 — End: 2016-06-15

## 2016-06-01 MED ORDER — LIDOCAINE 2% (20 MG/ML) 5 ML SYRINGE
INTRAMUSCULAR | Status: AC
  Filled 2016-06-01: qty 5

## 2016-06-01 MED ORDER — MIDAZOLAM HCL 2 MG/2ML IJ SOLN
INTRAMUSCULAR | Status: AC
  Filled 2016-06-01: qty 2

## 2016-06-01 MED ORDER — PROTAMINE SULFATE 10 MG/ML IV SOLN
INTRAVENOUS | Status: DC | PRN
  Administered 2016-06-01: 20 mg via INTRAVENOUS
  Administered 2016-06-01 (×3): 10 mg via INTRAVENOUS

## 2016-06-01 MED ORDER — LABETALOL HCL 5 MG/ML IV SOLN
10.0000 mg | INTRAVENOUS | Status: AC | PRN
  Administered 2016-06-02 – 2016-06-08 (×4): 10 mg via INTRAVENOUS
  Filled 2016-06-01 (×5): qty 4

## 2016-06-01 MED ORDER — MAGNESIUM SULFATE 2 GM/50ML IV SOLN: 2.0000 g | Freq: Once | INTRAVENOUS | Status: DC | PRN

## 2016-06-01 MED ORDER — POLYVINYL ALCOHOL 1.4 % OP SOLN
1.0000 [drp] | Freq: Three times a day (TID) | OPHTHALMIC | Status: DC
  Administered 2016-06-01 – 2016-06-15 (×37): 1 [drp] via OPHTHALMIC
  Filled 2016-06-01 (×2): qty 15

## 2016-06-01 MED ORDER — DEXTROSE 5 % IV SOLN
1.5000 g | Freq: Two times a day (BID) | INTRAVENOUS | Status: AC
  Administered 2016-06-01 – 2016-06-02 (×2): 1.5 g via INTRAVENOUS
  Filled 2016-06-01 (×2): qty 1.5

## 2016-06-01 MED ORDER — ROCURONIUM BROMIDE 50 MG/5ML IV SOSY
PREFILLED_SYRINGE | INTRAVENOUS | Status: AC
  Filled 2016-06-01: qty 5

## 2016-06-01 MED ORDER — METOPROLOL TARTRATE 5 MG/5ML IV SOLN
2.0000 mg | INTRAVENOUS | Status: DC | PRN
  Filled 2016-06-01: qty 5

## 2016-06-01 MED ORDER — PHENYLEPHRINE HCL 10 MG/ML IJ SOLN
INTRAMUSCULAR | Status: DC | PRN
Start: 2016-06-01 — End: 2016-06-01
  Administered 2016-06-01 (×2): 80 ug via INTRAVENOUS

## 2016-06-01 MED ORDER — PROMETHAZINE HCL 25 MG/ML IJ SOLN: 6.2500 mg | INTRAMUSCULAR | Status: DC | PRN

## 2016-06-01 MED ORDER — SODIUM CHLORIDE 0.9 % IV SOLN
INTRAVENOUS | Status: DC
  Administered 2016-06-01: 11:00:00 via INTRAVENOUS

## 2016-06-01 MED ORDER — SODIUM CHLORIDE 0.9 % IV SOLN: 500.0000 mL | Freq: Once | INTRAVENOUS | Status: DC | PRN

## 2016-06-01 MED ORDER — ESMOLOL HCL 100 MG/10ML IV SOLN
INTRAVENOUS | Status: DC | PRN
  Administered 2016-06-01: 10 mg via INTRAVENOUS
  Administered 2016-06-01: 20 mg via INTRAVENOUS

## 2016-06-01 MED ORDER — ALUM & MAG HYDROXIDE-SIMETH 200-200-20 MG/5ML PO SUSP: 15.0000 mL | ORAL | Status: DC | PRN

## 2016-06-01 MED ORDER — PHENOL 1.4 % MT LIQD
1.0000 | OROMUCOSAL | Status: DC | PRN
Start: 2016-06-01 — End: 2016-06-15

## 2016-06-01 MED ORDER — ROCURONIUM BROMIDE 100 MG/10ML IV SOLN
INTRAVENOUS | Status: DC | PRN
  Administered 2016-06-01: 10 mg via INTRAVENOUS
  Administered 2016-06-01 (×2): 20 mg via INTRAVENOUS
  Administered 2016-06-01: 50 mg via INTRAVENOUS
  Administered 2016-06-01: 20 mg via INTRAVENOUS

## 2016-06-01 MED ORDER — SUGAMMADEX SODIUM 200 MG/2ML IV SOLN
INTRAVENOUS | Status: AC
  Filled 2016-06-01: qty 4

## 2016-06-01 MED ORDER — PROPOFOL 10 MG/ML IV BOLUS
INTRAVENOUS | Status: AC
  Filled 2016-06-01: qty 20

## 2016-06-01 MED ORDER — SODIUM BICARBONATE 8.4 % IV SOLN
INTRAVENOUS | Status: DC | PRN
  Administered 2016-06-01: 25 mL via INTRAVENOUS

## 2016-06-01 MED ORDER — LATANOPROST 0.005 % OP SOLN
1.0000 [drp] | Freq: Every day | OPHTHALMIC | Status: DC
  Administered 2016-06-01 – 2016-06-14 (×13): 1 [drp] via OPHTHALMIC
  Filled 2016-06-01 (×2): qty 2.5

## 2016-06-01 MED ORDER — ALBUMIN HUMAN 5 % IV SOLN
INTRAVENOUS | Status: DC | PRN
  Administered 2016-06-01 (×2): via INTRAVENOUS

## 2016-06-01 MED ORDER — PROPOFOL 10 MG/ML IV BOLUS
INTRAVENOUS | Status: DC | PRN
  Administered 2016-06-01: 150 mg via INTRAVENOUS

## 2016-06-01 MED ORDER — ROCURONIUM BROMIDE 50 MG/5ML IV SOSY
PREFILLED_SYRINGE | INTRAVENOUS | Status: AC
  Filled 2016-06-01: qty 10

## 2016-06-01 SURGICAL SUPPLY — 63 items
ADH SKN CLS APL DERMABOND .7 (GAUZE/BANDAGES/DRESSINGS) ×3
AGENT HMST SPONGE THK3/8 (HEMOSTASIS)
ATTRACTOMAT 16X20 MAGNETIC DRP (DRAPES) ×2 IMPLANT
BAG ISL DRAPE 18X18 STRL (DRAPES) ×2
BAG ISOLATION DRAPE 18X18 (DRAPES) ×2 IMPLANT
CANISTER SUCTION 2500CC (MISCELLANEOUS) ×2 IMPLANT
CANNULA VESSEL 3MM 2 BLNT TIP (CANNULA) ×4 IMPLANT
CLIP TI MEDIUM 24 (CLIP) ×2 IMPLANT
CLIP TI WIDE RED SMALL 24 (CLIP) ×2 IMPLANT
COVER SURGICAL LIGHT HANDLE (MISCELLANEOUS) ×2 IMPLANT
DERMABOND ADVANCED (GAUZE/BANDAGES/DRESSINGS) ×3
DERMABOND ADVANCED .7 DNX12 (GAUZE/BANDAGES/DRESSINGS) ×3 IMPLANT
DRAPE ISOLATION BAG 18X18 (DRAPES) ×2
DRAPE PROXIMA HALF (DRAPES) ×2 IMPLANT
ELECT BLADE 4.0 EZ CLEAN MEGAD (MISCELLANEOUS) ×2
ELECT BLADE 6.5 EXT (BLADE) IMPLANT
ELECT REM PT RETURN 9FT ADLT (ELECTROSURGICAL) ×2
ELECTRODE BLDE 4.0 EZ CLN MEGD (MISCELLANEOUS) ×1 IMPLANT
ELECTRODE REM PT RTRN 9FT ADLT (ELECTROSURGICAL) ×1 IMPLANT
FELT TEFLON 1X6 (MISCELLANEOUS) ×2 IMPLANT
GLOVE BIO SURGEON STRL SZ 6.5 (GLOVE) ×8 IMPLANT
GLOVE BIO SURGEON STRL SZ7 (GLOVE) ×4 IMPLANT
GLOVE BIO SURGEON STRL SZ7.5 (GLOVE) ×4 IMPLANT
GLOVE BIO SURGEON STRL SZ8 (GLOVE) ×2 IMPLANT
GLOVE BIOGEL PI IND STRL 7.0 (GLOVE) ×4 IMPLANT
GLOVE BIOGEL PI INDICATOR 7.0 (GLOVE) ×4
GLOVE SURG SS PI 6.5 STRL IVOR (GLOVE) ×2 IMPLANT
GLOVE SURG SS PI 7.0 STRL IVOR (GLOVE) ×6 IMPLANT
GOWN STRL REUS W/ TWL LRG LVL3 (GOWN DISPOSABLE) ×4 IMPLANT
GOWN STRL REUS W/ TWL XL LVL3 (GOWN DISPOSABLE) ×4 IMPLANT
GOWN STRL REUS W/TWL LRG LVL3 (GOWN DISPOSABLE) ×4
GOWN STRL REUS W/TWL XL LVL3 (GOWN DISPOSABLE) ×4
GRAFT HEMASHIELD 16X8MM (Vascular Products) ×2 IMPLANT
HEMOSTAT SPONGE AVITENE ULTRA (HEMOSTASIS) IMPLANT
INSERT FOGARTY 61MM (MISCELLANEOUS) ×2 IMPLANT
INSERT FOGARTY SM (MISCELLANEOUS) ×4 IMPLANT
KIT BASIN OR (CUSTOM PROCEDURE TRAY) ×2 IMPLANT
KIT ROOM TURNOVER OR (KITS) ×2 IMPLANT
LOOP VESSEL MINI RED (MISCELLANEOUS) ×4 IMPLANT
NS IRRIG 1000ML POUR BTL (IV SOLUTION) ×6 IMPLANT
PACK AORTA (CUSTOM PROCEDURE TRAY) ×2 IMPLANT
PAD ARMBOARD 7.5X6 YLW CONV (MISCELLANEOUS) ×4 IMPLANT
PUNCH AORTIC ROTATE 5MM 8IN (MISCELLANEOUS) ×2 IMPLANT
RETAINER VISCERA MED (MISCELLANEOUS) ×2 IMPLANT
SPONGE LAP 18X18 X RAY DECT (DISPOSABLE) ×2 IMPLANT
STAPLER VISISTAT 35W (STAPLE) ×2 IMPLANT
SUT PDS AB 1 TP1 54 (SUTURE) ×4 IMPLANT
SUT PROLENE 3 0 SH DA (SUTURE) ×4 IMPLANT
SUT PROLENE 3 0 SH1 36 (SUTURE) ×4 IMPLANT
SUT PROLENE 5 0 C 1 36 (SUTURE) ×4 IMPLANT
SUT PROLENE 5 0 CC 1 (SUTURE) ×4 IMPLANT
SUT PROLENE 6 0 C 1 30 (SUTURE) ×2 IMPLANT
SUT PROLENE 6 0 CC (SUTURE) ×14 IMPLANT
SUT SILK 2 0SH CR/8 30 (SUTURE) ×2 IMPLANT
SUT VIC AB 2-0 CT1 27 (SUTURE) ×2
SUT VIC AB 2-0 CT1 TAPERPNT 27 (SUTURE) ×1 IMPLANT
SUT VIC AB 2-0 SH 27 (SUTURE) ×2
SUT VIC AB 2-0 SH 27XBRD (SUTURE) ×1 IMPLANT
SUT VIC AB 3-0 SH 27 (SUTURE) ×4
SUT VIC AB 3-0 SH 27X BRD (SUTURE) ×2 IMPLANT
SUT VICRYL 4-0 PS2 18IN ABS (SUTURE) ×4 IMPLANT
TRAY FOLEY W/METER SILVER 16FR (SET/KITS/TRAYS/PACK) ×2 IMPLANT
WATER STERILE IRR 1000ML POUR (IV SOLUTION) ×4 IMPLANT

## 2016-06-01 NOTE — Op Note (Signed)
Procedure: Supraceliac aorta to celiac and superior mesenteric artery bypass  Preoperative diagnosis: Chronic mesenteric ischemia  Postoperative diagnosis: Same  Anesthesia: Gen.  Assistant: Servando Snare M.D., [facility] PA-C  Operative findings: 16x 8 mm Dacron graft end to side to supraceliac aorta, end-to-side to celiac confluens, end-to-side to superior mesenteric artery  Operative details: After obtaining informed consent, the patient was taken the operating room. The patient was placed in supine position operating table. After induction of general anesthesia and endotracheal intubation, the patient was prepped and draped in the usual sterile fashion from the nipples to the knees. A foley catheter was placed. A nasogastric tube was placed. Next the midline laparotomy incision was performed extending from the xiphoid to just below the umbilicus. Incision was carried through the subcutaneous tissues down to level of fascia. The fascia was incised for the full length of the incision. The small bowel was inspected and found to be viable. The stomach was viable. The colon, liver gallbladder and spleen all had normal appearance.  The NG tube was palpated and found to be in the fundus of the stomach. The left lateral segment of the liver was then taken down with cautery. Handheld Deaver retractors were used to assist in exposure.  Dissection was then carried down to the level of the crura of the diaphragm. The left crus of the diaphragm was taken down with cautery. The supraceliac aorta was palpated and found to be soft with some eccentric calcified plaque. This was dissected free circumferentially. An umbilical tape was placed around this.   I then proceeded to dissect out the celiac artery along the anterior wall of the aorta. The main trunk branches were also dissected free circumferentially and vessel loops were placed around these.   Attention was then turned to the superior mesenteric artery.  An omni retractor was brought up in the operative field to assist with retraction.  The small bowel was reflected to the right. The colon was reflected superiorly. The retroperitoneum was entered and the retrograde duodenal attachments were taken down with cautery dissection to reflect the duodenum to the patient's right. The Superior mesenteric artery was then located and dissected free circumferentially. The more proximal section of the superior mesenteric artery was very thickened and nonpulsatile. Preoperative imaging had shown thatt the proximal superior mesenteric artery was severely diseased. Therefore I dissected several centimeters down the superior mesenteric artery and approximately 5 branches were dissected free circumferentially and controlled with vessel loops. The artery had a visible lumen at this location and was soft in character. Next a tunnel was created adjacent to the superior mesenteric artery retrocolic retropancreatic up to the level of the celiac artery. An umbilical tape was placed in this tunnel.   The patient was then given 7000 units of intravenous heparin.  He was given an additional 2000 units of heparin during the case.   After appropriate circulation time the aorta was clamped just below the level of the diaphragm. An additional clamp was placed just above the celiac origin. A longitudinal opening was made in the aorta with an 11 blade. The aorta was moderately diseased in this location  With some anterior and left side plaque. Next a 16 x 8 mm Dacron graft was brought in operative field and beveled. It was then sewn end of graft to side of aorta using a running 3-0 Prolene suture. The distal aorta was backbled to test the anastomosis.  Both limbs of the graft were then filled with blood after  removing the supraceliac aortic clamp. Each limb was then flushed thoroughly. The distal aortic clamp was then removed restoring circulation to the aorta distally. The supraceliac aortic  clamp was in place for approximately 40 minutes. The limbs of the graft were occluded with straight coarct clamps right at the origin. Vessel loops were then used to control each branch of the celiac. The proximal celiac was controlled with a Henley clamp. A longitudinal opening was made in the confluens of the branches. The graft was cut to length and beveled. The graft was then sewn end of graft to side of artery using a running 6-0 Prolene suture. Just prior to completion of the anastomosis it was forebled backbled and thoroughly flushed. The anastomosis was secured; clamps released; and there was good pulsatile flow in the major branches of the celiac artery immediately. 2 repair stitches were placed on the right wall of the anastomosis. This was then hemostatic.  An additional 2000 units of heparin was given.  Next the remaining limb of the graft was brought through the retropancreatic tunnel down to the level of the superior mesenteric artery. Vessel loops were used to control all the branches of the superior mesenteric artery. A longitudinal opening was made on the anterior wall of the superior mesenteric artery. The graft was cut to length and sewn end of graft to side of artery using a running 6-0 Prolene suture. Just prior to completion of the anastomosis it was forebled backbled and thoroughly flushed. The anastomosis was secured and the clamps were released. There was good pulsatile flow in the distal superior mesenteric artery immediately. This augmented approximately 60-70 % with unclamping of the graft. Hemostasis was obtained with 1 additional repair suture. The retroperitoneum was then closed with a running 3-0 Vicryl suture. The viscera were returned to their normal position in the Omni retractor removed. The small bowel was pink and viable. The colon was pink and viable. The left lateral segment of the liver was  was well perfused.  The supraceliac aorta was inspected and all this was hemostatic  as well. The left crus of the diaphragm was fairly macerated and I did not believe it would accept any sutures to close this. At this point all retractors were removed. The fascia was reapproximated using a #1 PDS suture. The skin was closed with 4 0 monocryl running suture. The patient tolerated the procedure well and there were no complications. Instrument sponge and needle count was correct at the end of the case. The patient had palpable femoral pulses at the end of the case. Patient's feet were pink and warm at the end of the case. The patient was taken to the PACU in stable condition.  [name], MD [facility] [place] Office: [phone] Pager: [phone]

## 2016-06-01 NOTE — Transfer of Care (Signed)
Immediate Anesthesia Transfer of Care Note  Patient: Keith Gibson  Procedure(s) Performed: Procedure(s): AORTO SUPERIOR MESENTERIC AND CELIAC ARTERY BYPASS (N/A)  Patient Location: PACU  Anesthesia Type:General  Level of Consciousness: sedated  Airway & Oxygen Therapy: Patient Spontanous Breathing and Patient connected to face mask oxygen  Post-op Assessment: Report given to RN, Post -op Vital signs reviewed and stable and Patient moving all extremities X 4  Post vital signs: Reviewed and stable  Last Vitals:  Vitals:   06/01/16 0635  BP: (!) 162/82  Pulse: 74  Resp: 18  Temp: 36.5 C    Last Pain:  Vitals:   06/01/16 0635  TempSrc: Oral      Patients Stated Pain Goal: 3 ([phone])  Complications: No apparent anesthesia complications

## 2016-06-01 NOTE — H&P (View-Only) (Signed)
Referring Physician: Silvano Rusk, MD   Patient name: Keith Gibson            MRN: [id number]        DOB: 1940/12/11          Sex: male   REASON FOR CONSULT: Possible chronic mesenteric ischemia   HPI: Keith Gibson is a 76 y.o. male with a two-year history of slow weight loss of 20 pounds. The patient denies any changes in his diet or exercise pattern. He has had some changes in his bowel movement habits. He states he does have some loose stools occasionally and his bowel movements are fairly unpredictable. He denies any postprandial abdominal pain. He states the only time he gets abdominal pain is with spicy foods. He does describe a bandlike or full sensation across his upper portion of his abdomen but does not really know what relates to this symptom. He is a former smoker but quit smoking in 1998. He is on aspirin and a statin. He denies any family history of abdominal aortic aneurysm. He did have a stroke 2 years ago and had a right carotid endarterectomy in [place]. His carotids are currently followed by a doctor at [facility]. He also has a history of erectile dysfunction. Upper endoscopy one month ago showed gastritis and a gastric polyp. Pathology confirmed this. Colonoscopy showed 2 erosions in the cecum which were biopsied. Colon biopsy  showed active colitis. Otherwise normal exam. Other medical problems include COPD, hyperlipidemia, hypertension all of which are currently stable.       Past Medical History:  Diagnosis Date  . 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 Cataract      bil removed  . COPD (chronic obstructive pulmonary disease) ([place])    . Glaucoma    . Hyperlipidemia    . Hypertension    . Personal history of adenomatous  colonic polyps 03/22/2012    Adenomas 2003 and 2005 01/2010 2.5 cm TV adenoma of cecum removed 05/2010 - residual TV adenoma removal   . Shortness of breath    . Wears dentures      full top-partial bottom  . Wears glasses    .  Wears hearing aid      both ears         Past Surgical History:  Procedure Laterality Date  . APPENDECTOMY      . COLONOSCOPY      . EYE SURGERY Bilateral       laser, cataract surgery  . KNEE SURGERY   1990    arthroscopic on right knee  . SHOULDER ARTHROSCOPY WITH ROTATOR CUFF REPAIR AND SUBACROMIAL DECOMPRESSION Right 08/30/2012    Procedure: RIGHT SHOULDER ARTHROSCOPY WITH SUBACROMIAL DECOMPRESSION, DISTAL CLAVICLE RESECTION AND ROTATOR CUFF REPAIR;  Surgeon: Cammie Sickle., MD;  Location: [facility];  Service: Orthopedics;  Laterality: Right;  . TONSILLECTOMY      . vocal cord surg   1992    removed precancerous polyps           Family History  Problem Relation Age of Onset  . Heart Problems Mother    . Colon cancer Neg Hx        SOCIAL HISTORY: Social History         Social History  . Marital status: Married      Spouse name: N/A  . Number of children: 4  . Years of education: N/A  Occupational History  . Not on file.          Social History Main Topics  . Smoking status: Former Smoker      Types: Cigarettes      Quit date: 03/08/1997  . Smokeless tobacco: Never Used  . Alcohol use 8.4 oz/week      14 Cans of beer per week        Comment: occ  . Drug use: Unknown  . Sexual activity: Not on file        Other Topics Concern  . Not on file       Social History Narrative  . No narrative on file      No Known Allergies         Current Outpatient Prescriptions  Medication Sig Dispense Refill  . Artificial Tear Solution (SYSTANE CONTACTS) SOLN Apply to eye.      Marland Kitchen aspirin 81 MG tablet Take 81 mg by mouth daily.      Marland Kitchen atorvastatin (LIPITOR) 20 MG tablet Take 20 mg by mouth daily.      . brimonidine (ALPHAGAN P) 0.1 % SOLN        . donepezil (ARICEPT) 10 MG tablet Take 10 mg by mouth every morning.      Marland Kitchen lisinopril (PRINIVIL,ZESTRIL) 10 MG tablet Take 10 mg by mouth daily.      . Multiple Vitamins-Minerals (OCUVITE ADULT  50+ PO) Take by mouth daily.      . Omega-3 Fatty Acids (FISH OIL) 1000 MG CAPS Take by mouth daily.      . Potassium Gluconate 595 MG CAPS Take by mouth.      . sildenafil (VIAGRA) 25 MG tablet Take 25 mg by mouth daily as needed for erectile dysfunction.                 Current Facility-Administered Medications  Medication Dose Route Frequency Provider Last Rate Last Dose  . 0.9 %  sodium chloride infusion  500 mL Intravenous Continuous Gatha Mayer, MD          ROS:    General:  + weight loss, No Fever, chills   HEENT: No recent headaches, no nasal bleeding, no visual changes, no sore throat   Neurologic: No dizziness, blackouts, seizures. No recent symptoms of stroke or mini- stroke. No recent episodes of slurred speech, or temporary blindness.   Cardiac: No recent episodes of chest pain/pressure, no shortness of breath at rest.  + shortness of breath with exertion.  Denies history of atrial fibrillation or irregular heartbeat   Vascular: No history of rest pain in feet.  No history of claudication.  No history of non-healing ulcer, No history of DVT    Pulmonary: No home oxygen, no productive cough, no hemoptysis,  No asthma or wheezing   Musculoskeletal:  [ ]  Arthritis, [ ]  Low back pain,  [ ]  Joint pain   Hematologic:No history of hypercoagulable state.  No history of easy bleeding.  No history of anemia   Gastrointestinal: No hematochezia or melena,  No gastroesophageal reflux, no trouble swallowing   Urinary: [ ]  chronic Kidney disease, [ ]  on HD - [ ]  MWF or [ ]  TTHS, [ ]  Burning with urination, [ ]  Frequent urination, [ ]  Difficulty urinating;    Skin: No rashes   Psychological: No history of anxiety,  No history of depression     Physical Examination   Vitals:   05/07/16 1356  BP: 138/90  Pulse:  71  Resp: 16  Temp: 97.4 F (36.3 C)  TempSrc: Oral  SpO2: 97%  Weight: 150 lb (68 kg)  Height: 5\' 7"  (1.702 m)     General:  Alert and oriented, no acute  distress HEENT: Normal Neck: No bruit or JVD, well healed right neck scar Pulmonary: Clear to auscultation bilaterally Cardiac: Regular Rate and Rhythm without murmur Abdomen: Soft, non-tender, non-distended, no mass, no bruit  Skin: No rash Extremity Pulses:  2+ radial, brachial, femoral pulses bilaterally Musculoskeletal: No deformity or edema      Neurologic: Upper and lower extremity motor 5/5 and symmetric   DATA:  Patient has CT Angio the abdomen and pelvis dated 03/05/2016. I reviewed his findings today. This showed calcification of the infrarenal and suprarenal aorta. There is a potential 70% narrowing of the proximals superior Mesenteric artery. Possible 50% narrowing of the celiac artery. Patent IMA. There is some calcification at the origins of the renal arteries as well as the left common iliac artery as well. There were also numerous cysts in the liver. There was also some fullness of the pancreatic head.  Angio 11/24                                #1  90% stenosis celiac origin heavily calcified                                 #2  75-80% stenosis severely calcified superior mesenteric artery                                 #3  80% stenosis heavily calcified lesion left renal artery                                 #4  50% heavily calcified stenosis left common iliac artery                                 #5 bilateral 50-70% heavily calcified common femoral artery                                #6 intact bilateral lower extremity runoff 3 vessels left one-vessel right (posterior tibial)    ASSESSMENT:  Patient with symptoms not classical but 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 He also has findings on his CT Angio which suggests multi vessel mesenteric occlusive disease. He has diffuse atherosclerotic changes of his supra and infrarenal abdominal aorta. 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 Although he has some evidence of left iliac stenosis on CT scan he has palpable pedal pulses and exhibits no claudication symptoms.  All of these findings were confirmed on recent arteriogram with a 90% stenosis of the celiac 80% stenosis of the superior mesenteric artery.     PLAN:   aorto superior mesenteric and celiac artery bypass scheduled for 06/02/2015. Risks benefits possible complications and procedure details were discussed with the patient patient several children and his wife today. They understand and agree to proceed.  Possible complications including but not limited to bleeding, infection, death, gut infarction.     [name], MD [facility] [place] Office: [phone] Pager: [phone]

## 2016-06-01 NOTE — Progress Notes (Signed)
  Day of Surgery Note    Subjective:  Awake; grimaces, but really doesn't follow commands   Vitals:   06/01/16 1345 06/01/16 1400  BP: (!) 153/79 (!) 150/74  Pulse: 83 80  Resp: 18 14  Temp:      Incisions:   Clean and dry  Extremities:  Easily palpable pedal pulses bilaterally Cardiac:  regular Lungs:  Non labored Abdomen:  Soft, NT, ND Neuro:  Awake, moving all extremities but not really following any commands   Assessment/Plan:  This is a 76 y.o. male who is s/p Supraceliac aorta to celiac and superior mesenteric artery bypass    -pt's abdomen is soft -pt's BP running around [id number] systolic -pt not following commands-did receive some dilaudid-continue to monitor. -strict npo -to 2 south when bed available   Leontine Locket, PA-C 06/01/2016 2:01 PM [phone]

## 2016-06-01 NOTE — Anesthesia Postprocedure Evaluation (Addendum)
Anesthesia Post Note  Patient: Keith Gibson  Procedure(s) Performed: Procedure(s) (LRB): AORTO SUPERIOR MESENTERIC AND CELIAC ARTERY BYPASS (N/A)  Patient location during evaluation: PACU Anesthesia Type: General Level of consciousness: sedated Pain management: pain level controlled Vital Signs Assessment: post-procedure vital signs reviewed and stable Respiratory status: spontaneous breathing and respiratory function stable Cardiovascular status: stable Anesthetic complications: no       Last Vitals:  Vitals:   06/01/16 1445 06/01/16 1500  BP: (!) 148/75 (!) 144/78  Pulse: 83 84  Resp: 11 16  Temp:      Last Pain:  Vitals:   06/01/16 1500  TempSrc:   PainSc: Asleep                 [name],[place] DANIEL

## 2016-06-01 NOTE — Anesthesia Procedure Notes (Signed)
Procedure Name: Intubation Date/Time: 06/01/2016 7:42 AM Performed by: Keith Gibson Pre-anesthesia Checklist: Timeout performed, Patient being monitored, Suction available, Emergency Drugs available and Patient identified Patient Re-evaluated:Patient Re-evaluated prior to inductionOxygen Delivery Method: Circle system utilized Preoxygenation: Pre-oxygenation with 100% oxygen Intubation Type: IV induction Ventilation: Mask ventilation without difficulty Laryngoscope Size: Mac and 3 Grade View: Grade I Tube type: Oral Tube size: 7.5 mm Number of attempts: 1 Placement Confirmation: ETT inserted through vocal cords under direct vision,  positive ETCO2 and breath sounds checked- equal and bilateral Secured at: 23 cm Tube secured with: Tape Dental Injury: Teeth and Oropharynx as per pre-operative assessment

## 2016-06-01 NOTE — Anesthesia Procedure Notes (Signed)
Central Venous Catheter Insertion Performed by: Duane Boston, anesthesiologist Start/End1/15/2018 6:52 AM, 06/01/2016 7:02 AM Patient location: Pre-op. Preanesthetic checklist: patient identified, IV checked, site marked, risks and benefits discussed, surgical consent, monitors and equipment checked, pre-op evaluation, timeout performed and anesthesia consent Position: Trendelenburg Lidocaine 1% used for infiltration and patient sedated Hand hygiene performed , maximum sterile barriers used  and Seldinger technique used Catheter size: 8 Fr Total catheter length 16. Central line was placed.Double lumen Procedure performed using ultrasound guided technique. Ultrasound Notes:image(s) printed for medical record Attempts: 1 Following insertion, dressing applied, line sutured and Biopatch. Post procedure assessment: blood return through all ports, free fluid flow and no air  Patient tolerated the procedure well with no immediate complications.

## 2016-06-01 NOTE — Interval H&P Note (Signed)
History and Physical Interval Note:  06/01/2016 6:55 AM  Keith Gibson  has presented today for surgery, with the diagnosis of Chronic mesenteric ischemia K55.1  The various methods of treatment have been discussed with the patient and family. After consideration of risks, benefits and other options for treatment, the patient has consented to  Procedure(s): AORTO SUPERIOR MESENTERIC AND CELIAC ARTERY BYPASS (N/A) as a surgical intervention .  The patient's history has been reviewed, patient examined, no change in status, stable for surgery.  I have reviewed the patient's chart and labs.  Questions were answered to the patient's satisfaction.     [name]

## 2016-06-02 ENCOUNTER — Encounter (HOSPITAL_COMMUNITY): Payer: Self-pay | Admitting: Vascular Surgery

## 2016-06-02 ENCOUNTER — Inpatient Hospital Stay (HOSPITAL_COMMUNITY): Payer: Medicare Other

## 2016-06-02 DIAGNOSIS — R5381 Other malaise: Secondary | ICD-10-CM

## 2016-06-02 DIAGNOSIS — K559 Vascular disorder of intestine, unspecified: Secondary | ICD-10-CM

## 2016-06-02 LAB — COMPREHENSIVE METABOLIC PANEL
ALT: 55 U/L (ref 17–63)
ANION GAP: 8 (ref 5–15)
AST: 53 U/L — ABNORMAL HIGH (ref 15–41)
Albumin: 3.2 g/dL — ABNORMAL LOW (ref 3.5–5.0)
Alkaline Phosphatase: 37 U/L — ABNORMAL LOW (ref 38–126)
BILIRUBIN TOTAL: 0.9 mg/dL (ref 0.3–1.2)
BUN: 16 mg/dL (ref 6–20)
CO2: 23 mmol/L (ref 22–32)
Calcium: 7.8 mg/dL — ABNORMAL LOW (ref 8.9–10.3)
Chloride: 103 mmol/L (ref 101–111)
Creatinine, Ser: 1.15 mg/dL (ref 0.61–1.24)
GFR calc Af Amer: >60 mL/min (ref >60)
GFR, EST NON AFRICAN AMERICAN: 60 mL/min — AB (ref >60)
Glucose, Bld: 158 mg/dL — ABNORMAL HIGH (ref 65–99)
POTASSIUM: 4.1 mmol/L (ref 3.5–5.1)
Sodium: 134 mmol/L — ABNORMAL LOW (ref 135–145)
TOTAL PROTEIN: 5 g/dL — AB (ref 6.5–8.1)

## 2016-06-02 LAB — MAGNESIUM: MAGNESIUM: 1.4 mg/dL — AB (ref 1.7–2.4)

## 2016-06-02 LAB — CBC
HEMATOCRIT: 36.5 % — AB (ref 39.0–52.0)
Hemoglobin: 12.5 g/dL — ABNORMAL LOW (ref 13.0–17.0)
MCH: 31.6 pg (ref 26.0–34.0)
MCHC: 34.2 g/dL (ref 30.0–36.0)
MCV: 92.2 fL (ref 78.0–100.0)
PLATELETS: 161 10*3/uL (ref 150–400)
RBC: 3.96 MIL/uL — ABNORMAL LOW (ref 4.22–5.81)
RDW: 13.5 % (ref 11.5–15.5)
WBC: 15.7 10*3/uL — ABNORMAL HIGH (ref 4.0–10.5)

## 2016-06-02 LAB — POCT I-STAT 3, ART BLOOD GAS (G3+)
ACID-BASE DEFICIT: 1 mmol/L (ref 0.0–2.0)
Bicarbonate: 23.3 mmol/L (ref 20.0–28.0)
O2 Saturation: 92 %
PH ART: 7.389 (ref 7.350–7.450)
TCO2: 24 mmol/L (ref 0–100)
pCO2 arterial: 38.7 mmHg (ref 32.0–48.0)
pO2, Arterial: 64 mmHg — ABNORMAL LOW (ref 83.0–108.0)

## 2016-06-02 LAB — AMYLASE: AMYLASE: 61 U/L (ref 28–100)

## 2016-06-02 MED ORDER — MORPHINE SULFATE (PF) 2 MG/ML IV SOLN
1.0000 mg | INTRAVENOUS | Status: DC | PRN
  Administered 2016-06-02 – 2016-06-08 (×28): 1 mg via INTRAVENOUS
  Filled 2016-06-02 (×30): qty 1

## 2016-06-02 MED ORDER — MAGNESIUM SULFATE 4 GM/100ML IV SOLN
4.0000 g | Freq: Once | INTRAVENOUS | Status: DC | PRN
  Filled 2016-06-02: qty 100

## 2016-06-02 MED ORDER — METOPROLOL TARTRATE 5 MG/5ML IV SOLN
2.5000 mg | Freq: Four times a day (QID) | INTRAVENOUS | Status: DC
  Administered 2016-06-02 – 2016-06-10 (×27): 2.5 mg via INTRAVENOUS
  Filled 2016-06-02 (×27): qty 5

## 2016-06-02 MED FILL — Heparin Sodium (Porcine) Inj 1000 Unit/ML: INTRAMUSCULAR | Qty: 30 | Status: AC

## 2016-06-02 MED FILL — Sodium Chloride IV Soln 0.9%: INTRAVENOUS | Qty: 2000 | Status: AC

## 2016-06-02 NOTE — Progress Notes (Addendum)
  AAA Progress Note    06/02/2016 7:19 AM 1 Day Post-Op  Subjective:  Sleeping-wakes easily and follows commands  Tm 99 HR 70's-80's NSR [id number] systolic (cuff pressures) 93% 3LO2NC  Gtts:  None   Vitals:   06/02/16 0600 06/02/16 0700  BP: (!) 150/81 (!) 160/79  Pulse: 82 82  Resp: 16 (!) 21  Temp:      Physical Exam: Cardiac:  regular Lungs:  Decreased breath sounds throughout bilaterally Abdomen:  Soft, NT/ND; -BS Incisions:  Clean and dry  Extremities:  Easily palpable pedal pulses bilaterally Neuro:  Alert to year and president; following commands and moves all extremities equally  CBC    Component Value Date/Time   WBC 17.0 (H) 06/01/2016 1306   RBC 4.03 (L) 06/01/2016 1306   HGB 12.7 (L) 06/01/2016 1306   HCT 37.2 (L) 06/01/2016 1306   PLT 153 06/01/2016 1306   MCV 92.3 06/01/2016 1306   MCH 31.5 06/01/2016 1306   MCHC 34.1 06/01/2016 1306   RDW 13.1 06/01/2016 1306    BMET    Component Value Date/Time   NA 134 (L) 06/02/2016 0500   K 4.1 06/02/2016 0500   CL 103 06/02/2016 0500   CO2 23 06/02/2016 0500   GLUCOSE 158 (H) 06/02/2016 0500   BUN 16 06/02/2016 0500   CREATININE 1.15 06/02/2016 0500   CALCIUM 7.8 (L) 06/02/2016 0500   GFRNONAA 60 (L) 06/02/2016 0500   GFRAA >60 06/02/2016 0500    INR    Component Value Date/Time   INR 1.33 06/01/2016 1306     Intake/Output Summary (Last 24 hours) at 06/02/16 0719 Last data filed at 06/02/16 0700  Gross per 24 hour  Intake             6020 ml  Output             2245 ml  Net             3775 ml   NGT output/24 hrs:  25cc   Assessment/Plan:  76 y.o. male is s/p  Supraceliac aorta to celiac and superior mesenteric artery bypass 1 Day Post-Op  -pt doing well this morning -neuro appears in tact as he is following commands -pCO2 improved from yesterday -no bowel sounds heard-continue strict npo until he has BM.  May need TPN in the next day or two if bowel function is slow to return  (albumin 3.2) -CHF on cxr yesterday-still with some interstitial edema on cxr. Hold off on any diuresis at this point per Dr. Oneida Alar.  Needs incentive spirometer & mobilization. -hypertensive-will start scheduled lopressor 2.5mg  IV q6h -pt with acute surgical blood loss anemia yesterday, but did not require blood transfusion.  CBC not back yet today.  Will hold off on SQ heparin until CBC results are back.  Continue SCD's. -transfer to [place], [place] [facility] [phone] 06/02/2016 7:19 AM   Agree with above.  Uneventful night.  Good urine output.  Will wait until BM before feeding.  Continue IVF at 100 today.  Will start talking about diuresis tomorrow if he does not begin to mobilize fluid but probably still with 3rd space losses currently.    Abdomen incision clean dry, 2+ femoral pulses, lethargic but answers questions.  Some hypertension.  Transfer 2w oob mobilize Hypomagnesemia replete Amylase lipase LFTs essentially normal  [name], MD [facility] [place] Office: [phone] Pager: [phone]

## 2016-06-02 NOTE — Progress Notes (Addendum)
Rehab Admissions Coordinator Note:  Patient was screened by Cleatrice Burke for appropriateness for an Inpatient Acute Rehab Consult per PT recommendation.   At this time, we are recommending an inpt rehab consult.   Cleatrice Burke 06/02/2016, 12:40 PM  I can be reached at [phone].

## 2016-06-02 NOTE — Evaluation (Addendum)
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 PMHx: Right CEA, COPD, HLD, HTN, glaucoma  Clinical Impression  Pt pleasant, initially lethargic on arrival with decreased attention, orientation, memory and lack of initiation for movement. Pt with decreased strength, balance, gait, transfers, cognition, and mobility who will benefit from acute therapy to maximize mobility, balance, gait and independence to decrease burden of care and return pt to PLOF. Wife reports recent diagnosis of dementia but still highly functional at home PTA.  Pt with sats 94% on 3L    Follow Up Recommendations CIR    Equipment Recommendations  Rolling walker with 5" wheels    Recommendations for Other Services       Precautions / Restrictions Precautions Precautions: (P) Fall Precaution Comments: (P) NGtube, watch sats      Mobility  Bed Mobility Overal bed mobility: Needs Assistance Bed Mobility: Supine to Sit     Supine to sit: +2 for safety/equipment;+2 for physical assistance;HOB elevated;Max assist     General bed mobility comments: cues for sequence with inability to maintain knee flexion for roll without tactile cueing and assist. Assist to rotate and elevate trunk from surface as well as bring legs off of bed  Transfers Overall transfer level: Needs assistance   Transfers: Sit to/from Stand Sit to Stand: Mod assist;+2 physical assistance;+2 safety/equipment         General transfer comment: cues for hand placement and sequence, assist to rise and for anterior translation  Ambulation/Gait Ambulation/Gait assistance: Mod assist;+2 physical assistance Ambulation Distance (Feet): 50 Feet Assistive device: Rolling walker (2 wheeled);2 person hand held assist Gait Pattern/deviations: Step-through  pattern;Trunk flexed;Wide base of support   Gait velocity interpretation: Below normal speed for age/gender General Gait Details: initiatiated gait with RW with pt unable to initiate RW movement with assist to advance RW pt followed behind with flexed trunk and hips, self posterior to walker and unsafe sequence despite cues and assist for posture and position with pt at times going onto his tip toes. Removed RW after grossly 20' and with bil HHA pt with improved posture, gait, stride and stability  Stairs            Wheelchair Mobility    Modified Rankin (Stroke Patients Only)       Balance Overall balance assessment: Needs assistance   Sitting balance-Leahy Scale: Poor       Standing balance-Leahy Scale: Poor                               Pertinent Vitals/Pain Pain Assessment: (P) Faces Faces Pain Scale: (P) Hurts little more Pain Location: (P) abdomen Pain Descriptors / Indicators: (P) Grimacing Pain Intervention(s): (P) Limited activity within patient's tolerance    Home Living Family/patient expects to be discharged to:: Private residence Living Arrangements: Spouse/significant other Available Help at Discharge: Family;Available 24 hours/day Type of Home: House Home Access: Stairs to enter Entrance Stairs-Rails: Right;Left;Can reach both Entrance Stairs-Number of Steps: 4-5 Home Layout: One level Home Equipment: None      Prior Function Level of Independence: (P) Independent         Comments: (P) pt was driving, mowing and caring for himself. Wife reports recent diagnosis of dementia     Hand Dominance   Dominant Hand: (P) Right  Extremity/Trunk Assessment   Upper Extremity Assessment Upper Extremity Assessment: Defer to OT evaluation    Lower Extremity Assessment Lower Extremity Assessment: Generalized weakness    Cervical / Trunk Assessment Cervical / Trunk Assessment: Normal  Communication   Communication: (P) Other  (comment) (slurred speech)  Cognition Arousal/Alertness: (P) Awake/alert Behavior During Therapy: (P) Flat affect Overall Cognitive Status: (P) Impaired/Different from baseline Area of Impairment: (P) Orientation;Attention;Memory;Following commands;Safety/judgement;Awareness;Problem solving Orientation Level: Disoriented to;Time;Place Current Attention Level: Sustained Memory: Decreased short-term memory Following Commands: Follows one step commands inconsistently;Follows one step commands with increased time Safety/Judgement: Decreased awareness of safety;Decreased awareness of deficits          General Comments      Exercises     Assessment/Plan    PT Assessment Patient needs continued PT services  PT Problem List Decreased strength;Decreased mobility;Decreased safety awareness;Decreased activity tolerance;Decreased cognition;Decreased coordination;Decreased balance;Decreased knowledge of use of DME          PT Treatment Interventions Gait training;Therapeutic exercise;DME instruction;Therapeutic activities;Cognitive remediation;Patient/family education;Stair training;Balance training;Functional mobility training;Neuromuscular re-education    PT Goals (Current goals can be found in the Care Plan section)  Acute Rehab PT Goals Patient Stated Goal: be able to return home, speed boat PT Goal Formulation: With patient/family Time For Goal Achievement: 06/16/16 Potential to Achieve Goals: Good    Frequency Min 3X/week   Barriers to discharge        Co-evaluation PT/OT/SLP Co-Evaluation/Treatment: Yes Reason for Co-Treatment: (P) Complexity of the patient's impairments (multi-system involvement);For patient/therapist safety;To address functional/ADL transfers PT goals addressed during session: Mobility/safety with mobility;Balance;Proper use of DME OT goals addressed during session: (P) ADL's and self-care       End of Session Equipment Utilized During Treatment: Gait  belt;Oxygen Activity Tolerance: Patient tolerated treatment well Patient left: in chair;with call bell/phone within reach;with family/visitor present;with nursing/sitter in room (pt in [facility] with RN for transport to new room) Nurse Communication: Mobility status;Precautions         Time: [id number] PT Time Calculation (min) (ACUTE ONLY): 19 min   Charges:   PT Evaluation $PT Eval Moderate Complexity: 1 Procedure     PT G Codes:        Delon Revelo B Isamar Wellbrock 2016-06-03, 1:06 PM Elwyn Reach, [place]

## 2016-06-02 NOTE — Consult Note (Signed)
Physical Medicine and Rehabilitation Consult Reason for Consult: Debilitation related to mesenteric ischemia status post aorto superior mesenteric artery BPG Referring Physician: Dr. Oneida Alar   HPI: Keith Gibson is a 76 y.o. right handed male with history of chronic mesenteric ischemia followed by vascular surgery and reported recent weight loss of 20 pounds. Per chart review patient lives with spouse independent prior to admission and still driving.. One level home with 4 steps to entry. Patient with Intermittent bouts of abdominal pain. Recent upper endoscopy showed gastritis as well as colonoscopy showed 2 erosions of the cecum which were biopsied. 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 Surgical intervention recommended per vascular surgery. Underwent supraceliac aorta to celiac and superior mesenteric artery bypass 06/01/2016 per Dr. [name]. Hospital course pain management. Intermittent bouts of confusion and agitation .Currently with nasogastric tube for nutritional support. Physical therapy evaluation completed 06/02/2016 recommendations of physical medicine rehabilitation consult.   Review of Systems  Constitutional: Positive for malaise/fatigue and weight loss.  HENT: Positive for hearing loss. Negative for tinnitus.   Eyes: Negative for blurred vision and double vision.  Respiratory: Positive for cough and shortness of breath.   Cardiovascular: Negative for chest pain, palpitations and leg swelling.  Gastrointestinal: Positive for abdominal pain.  Genitourinary: Positive for urgency.  Skin: Negative for rash.  Neurological: Negative for seizures and loss of consciousness.  Psychiatric/Behavioral: Positive for memory loss.  All other systems reviewed and are negative.  Past Medical History:  Diagnosis Date  . 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 Cataract    bil removed  . COPD (chronic obstructive pulmonary disease) ([place])   . Glaucoma   . Hyperlipidemia   . Hypertension   . Memory loss   . 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 Shortness of breath   . Stroke ([place])    2015  . Wears dentures    full top-partial bottom  . Wears glasses   . Wears hearing aid    both ears   Past Surgical History:  Procedure Laterality Date  . APPENDECTOMY     patient denies  . CAROTID ENDARTERECTOMY Right 05/08/2014  . COLONOSCOPY    . EYE SURGERY Bilateral     laser, cataract surgery  . KNEE SURGERY  1990   arthroscopic on right knee  . PERIPHERAL VASCULAR CATHETERIZATION N/A 04/10/2016   Procedure: Abdominal Aortogram;  Surgeon: Elam Dutch, MD;  Location: [place] [facility];  Service: Cardiovascular;  Laterality: N/A;  . PERIPHERAL VASCULAR CATHETERIZATION Bilateral 04/10/2016   Procedure: Lower Extremity Angiography;  Surgeon: Elam Dutch, MD;  Location: [place] [facility];  Service: Cardiovascular;  Laterality: Bilateral;  . SHOULDER ARTHROSCOPY WITH ROTATOR CUFF REPAIR AND SUBACROMIAL DECOMPRESSION Right 08/30/2012   Procedure: RIGHT SHOULDER ARTHROSCOPY WITH SUBACROMIAL DECOMPRESSION, DISTAL CLAVICLE RESECTION AND ROTATOR CUFF REPAIR;  Surgeon: Cammie Sickle., MD;  Location: [place];  Service: Orthopedics;  Laterality: Right;  . TONSILLECTOMY    . vocal cord surg  1992   removed precancerous polyps   Family History  Problem Relation Age of Onset  . Heart Problems Mother   . Colon cancer Neg Hx    Social History:  reports that  he quit smoking about 19 years ago. His smoking use included Cigarettes. He has never used smokeless tobacco. He reports that he does not drink alcohol or use drugs. Allergies:  Allergies  Allergen Reactions  . No Known Allergies     Medications Prior to Admission  Medication Sig Dispense Refill  . Artificial Tear Solution (SYSTANE CONTACTS) SOLN Place 1 drop into both eyes 3 (three) times daily.     Marland Kitchen aspirin EC 81 MG tablet Take 81 mg by mouth daily.    Marland Kitchen atorvastatin (LIPITOR) 40 MG tablet Take 40 mg by mouth daily.    . brimonidine (ALPHAGAN P) 0.1 % SOLN Place 1 drop into both eyes every 8 (eight) hours.     Marland Kitchen donepezil (ARICEPT) 10 MG tablet Take 10 mg by mouth every morning.    . latanoprost (XALATAN) 0.005 % ophthalmic solution Place 1 drop into the left eye at bedtime.    Marland Kitchen lisinopril (PRINIVIL,ZESTRIL) 10 MG tablet Take 10 mg by mouth daily.    . Multiple Vitamins-Minerals (CENTRUM SILVER 50+MEN PO) Take 1 tablet by mouth daily.    . Multiple Vitamins-Minerals (OCUVITE ADULT 50+ PO) Take 1 tablet by mouth daily.     . Omega-3 Fatty Acids (FISH OIL) 1200 MG CAPS Take 1,200 mg by mouth daily.     . sildenafil (VIAGRA) 25 MG tablet Take 25 mg by mouth daily as needed for erectile dysfunction.      Home: Home Living Family/patient expects to be discharged to:: Private residence Living Arrangements: Spouse/significant other Available Help at Discharge: Family, Available 24 hours/day Type of Home: House Home Access: Stairs to enter [employer] of Steps: 4-5 Entrance Stairs-Rails: Right, Left, Can reach both Home Layout: One level Bathroom Shower/Tub: Tub/shower unit, Architectural technologist: Standard Bathroom Accessibility: Yes Home Equipment: None  Functional History: Prior Function Level of Independence: Independent Comments: pt was driving, mowing and caring for himself. Wife reports recent diagnosis of dementia Functional Status:  Mobility: Bed Mobility Overal bed mobility: Needs Assistance Bed Mobility: Supine to Sit Supine to sit: +2 for safety/equipment, +2 for physical assistance, HOB elevated, Max assist General bed mobility comments: cues for sequence with inability to maintain  knee flexion for roll without tactile cueing and assist. Assist to rotate and elevate trunk from surface as well as bring legs off of bed Transfers Overall transfer level: Needs assistance Equipment used: 2 person hand held assist Transfers: Sit to/from Stand Sit to Stand: Mod assist, +2 physical assistance, +2 safety/equipment General transfer comment: cues for hand placement and sequence, assist to rise and for anterior translation Ambulation/Gait Ambulation/Gait assistance: Mod assist, +2 physical assistance Ambulation Distance (Feet): 50 Feet Assistive device: Rolling walker (2 wheeled), 2 person hand held assist Gait Pattern/deviations: Step-through pattern, Trunk flexed, Wide base of support General Gait Details: initiatiated gait with RW with pt unable to initiate RW movement with assist to advance RW pt followed behind with flexed trunk and hips, self posterior to walker and unsafe sequence despite cues and assist for posture and position with pt at times going onto his tip toes. Removed RW after grossly 20' and with bil HHA pt with improved posture, gait, stride and stability Gait velocity interpretation: Below normal speed for age/gender    ADL: ADL Overall ADL's : Needs assistance/impaired Eating/Feeding: NPO Grooming: Maximal assistance Upper Body Bathing: Maximal assistance Lower Body Bathing: Maximal assistance Upper Body Dressing : Maximal assistance Lower Body Dressing: Total assistance Toilet Transfer: Moderate assistance, +2 for physical assistance  Toileting- Clothing Manipulation and Hygiene: Total assistance Functional mobility during ADLs: Moderate assistance, +2 for physical assistance  Cognition: Cognition Overall Cognitive Status: Impaired/Different from baseline Orientation Level: Oriented to person, Oriented to place, Oriented to time, Disoriented to situation Cognition Arousal/Alertness: Awake/alert Behavior During Therapy: Flat affect Overall Cognitive  Status: Impaired/Different from baseline Area of Impairment: Orientation, Attention, Memory, Following commands, Safety/judgement, Awareness, Problem solving Orientation Level: Disoriented to, Place, Time Current Attention Level: Sustained Memory: Decreased recall of precautions, Decreased short-term memory Following Commands: Follows one step commands inconsistently Safety/Judgement: Decreased awareness of safety, Decreased awareness of deficits Awareness: Intellectual Problem Solving: Slow processing, Decreased initiation, Difficulty sequencing, Requires verbal cues, Requires tactile cues  Blood pressure (!) 155/74, pulse 87, temperature 98.1 F (36.7 C), resp. rate 18, height 5\' 7"  (1.702 m), weight 68.5 kg (151 lb), SpO2 91 %. Physical Exam  HENT:  Head: Normocephalic.  Nasogastric tube in place  Neck: Normal range of motion. Neck supple. No thyromegaly present.  Cardiovascular: Normal rate and regular rhythm.   Respiratory:  Decrease breath sounds at the bases  GI: There is no rebound.  Mildly distended and tender to palpate  Neurological:  Lethargic but arousable. He will answer basic questions but will easily fall back to sleep. Follows simple commands. Moves all 4's but difficult to assess due to lethargy. Senses pain. Reacts to wife's voice  Skin: Skin is warm and dry.    Results for orders placed or performed during the hospital encounter of 06/01/16 (from the past 24 hour(s))  I-STAT 3, arterial blood gas (G3+)     Status: Abnormal   Collection Time: 06/02/16  4:35 AM  Result Value Ref Range   pH, Arterial 7.389 7.350 - 7.450   pCO2 arterial 38.7 32.0 - 48.0 mmHg   pO2, Arterial 64.0 (L) 83.0 - 108.0 mmHg   Bicarbonate 23.3 20.0 - 28.0 mmol/L   TCO2 24 0 - 100 mmol/L   O2 Saturation 92.0 %   Acid-base deficit 1.0 0.0 - 2.0 mmol/L   Patient temperature 99.0 F    Collection site ARTERIAL LINE    Drawn by Nurse    Sample type ARTERIAL   CBC     Status: Abnormal    Collection Time: 06/02/16  5:00 AM  Result Value Ref Range   WBC 15.7 (H) 4.0 - 10.5 K/uL   RBC 3.96 (L) 4.22 - 5.81 MIL/uL   Hemoglobin 12.5 (L) 13.0 - 17.0 g/dL   HCT 36.5 (L) 39.0 - 52.0 %   MCV 92.2 78.0 - 100.0 fL   MCH 31.6 26.0 - 34.0 pg   MCHC 34.2 30.0 - 36.0 g/dL   RDW 13.5 11.5 - 15.5 %   Platelets 161 150 - 400 K/uL  Comprehensive metabolic panel     Status: Abnormal   Collection Time: 06/02/16  5:00 AM  Result Value Ref Range   Sodium 134 (L) 135 - 145 mmol/L   Potassium 4.1 3.5 - 5.1 mmol/L   Chloride 103 101 - 111 mmol/L   CO2 23 22 - 32 mmol/L   Glucose, Bld 158 (H) 65 - 99 mg/dL   BUN 16 6 - 20 mg/dL   Creatinine, Ser 1.15 0.61 - 1.24 mg/dL   Calcium 7.8 (L) 8.9 - 10.3 mg/dL   Total Protein 5.0 (L) 6.5 - 8.1 g/dL   Albumin 3.2 (L) 3.5 - 5.0 g/dL   AST 53 (H) 15 - 41 U/L   ALT 55 17 - 63 U/L  Alkaline Phosphatase 37 (L) 38 - 126 U/L   Total Bilirubin 0.9 0.3 - 1.2 mg/dL   GFR calc non Af Amer 60 (L) >60 mL/min   GFR calc Af Amer >60 >60 mL/min   Anion gap 8 5 - 15  Magnesium     Status: Abnormal   Collection Time: 06/02/16  5:00 AM  Result Value Ref Range   Magnesium 1.4 (L) 1.7 - 2.4 mg/dL  Amylase     Status: None   Collection Time: 06/02/16  5:00 AM  Result Value Ref Range   Amylase 61 28 - 100 U/L   Dg Chest Port 1 View  Result Date: 06/02/2016 CLINICAL DATA:  Sore chest, post laparotomy, history hypertension, COPD, former smoker EXAM: PORTABLE CHEST 1 VIEW COMPARISON:  Portable exam 0550 hours compared 06/01/2016 FINDINGS: Nasogastric tube extends into stomach. RIGHT jugular central venous catheter with tip projecting over SVC. Normal heart size, mediastinal contours, and pulmonary vascularity. Atherosclerotic calcification aorta. Additional atherosclerotic calcifications of the great vessels and in the neck at the LEFT carotid system. Persistent atelectasis versus consolidation at lung bases LEFT greater than RIGHT. Central peribronchial thickening.  Tiny LEFT pleural effusion IMPRESSION: Bronchitic changes with bibasilar atelectasis LEFT greater than RIGHT. Aortic atherosclerosis.  S Electronically Signed   By: Lavonia Dana M.D.   On: 06/02/2016 08:50   Dg Chest Port 1 View  Result Date: 06/01/2016 CLINICAL DATA:  Laparotomy. EXAM: PORTABLE CHEST 1 VIEW COMPARISON:  10/03/2007 report. FINDINGS: NG tube, right IJ line in good anatomic position. Cardiomegaly with pulmonary vascular prominence and bilateral interstitial prominence. No prominent pleural effusion or pneumothorax . IMPRESSION: 1. Lines and tubes in good anatomic position. 2.  Congestive heart failure with mild interstitial edema . 3.  Low lung volumes. Electronically Signed   By: Marcello Moores  Register   On: 06/01/2016 13:28   Dg Abd Portable 1v  Result Date: 06/01/2016 CLINICAL DATA:  Status post laparotomy. EXAM: PORTABLE ABDOMEN - 1 VIEW COMPARISON:  03/05/2016 FINDINGS: Nasogastric tube with tip at the gastric antrum. Normal bowel gas pattern. Atherosclerosis. Bowel gas versus retroperitoneal gas in this patient with history of aortic bypass. IMPRESSION: Nasogastric tube tip at the gastric antrum. Electronically Signed   By: Monte Fantasia M.D.   On: 06/01/2016 13:28    Assessment/Plan: Diagnosis: Debility after aortic-celiac-superior mesenteric bypass  1. Does the need for close, 24 hr/day medical supervision in concert with the patient's rehab needs make it unreasonable for this patient to be served in a less intensive setting? potentially 2. Co-Morbidities requiring supervision/potential complications: post-op complications, nutrition/NPO, pain mgt, wound care 3. Due to wound care, education, medication administration, does the patient require 24 hr/day rehab nursing? yes 4. Does the patient require coordinated care of a physician, rehab nurse to address physical and functional deficits in the context of the above medical diagnosis(es)? yes 5. Can the patient actively participate in  an intensive therapy program of at least 3 hrs of therapy per day at least 5 days per week? yes 6. The potential for patient to make measurable gains while on inpatient rehab is excellent 7. Anticipated functional outcomes upon discharge from inpatient rehab are potentially mod I  with PT, potentially mod I with OT, potentially mod I with SLP. 8. Estimated rehab length of stay to reach the above functional goals is: to be determined 9. Does the patient have adequate social supports and living environment to accommodate these discharge functional goals? yes 10. Anticipated D/C setting: home 11. Anticipated  post D/C treatments: home health therapies 12. Overall Rehab/Functional Prognosis: excellent  RECOMMENDATIONS: This patient's condition is appropriate for continued rehabilitative care in the following setting: see below Patient has agreed to participate in recommended program. n/a Note that insurance prior authorization may be required for reimbursement for recommended care.  Comment: Pt is post-op day #1 today and expectedly tender and somnolent. Will follow for future rehab needs as he is more able to participate in therapies. Rehab Admissions Coordinator to follow up.  Thanks,  Meredith Staggers, MD, Mellody Drown    Cathlyn Parsons., PA-C 06/02/2016

## 2016-06-02 NOTE — Progress Notes (Signed)
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 PMHx: Right CEA, COPD, HLD, HTN, glaucoma   Clinical Impression   PTA, pt independent with ADL and mobility. Was active and currently driving. Wife reports pt recently diagnosed with "memory problems", and had started "Namenda". Pt currently requries +2 Mod A with mobility and max A with ADL due to deficits below. Feel pt will benefit from rehab at CIR to maximize functional level of independence and facilitate safe DC home. Good family support. Will follow acutely to facilitate DC to next venue of care and address established goals.     Follow Up Recommendations  CIR;Supervision/Assistance - 24 hour    Equipment Recommendations  3 in 1 bedside commode;Tub/shower bench    Recommendations for Other Services Rehab consult     Precautions / Restrictions Precautions Precautions: Fall Precaution Comments: NGtube, watch sats      Mobility Bed Mobility Overal bed mobility: Needs Assistance Bed Mobility: Supine to Sit     Supine to sit: +2 for safety/equipment;+2 for physical assistance;HOB elevated;Max assist     General bed mobility comments: cues for sequence with inability to maintain knee flexion for roll without tactile cueing and assist. Assist to rotate and elevate trunk from surface as well as bring legs off of bed  Unable to use RW due to apparentl. More functional mobility with +2 HHA  Transfers Overall transfer level: Needs assistance Equipment used: 2 person hand held assist Transfers: Sit to/from Stand Sit to Stand: Mod assist;+2 physical assistance;+2 safety/equipment         General transfer comment: cues for hand placement and sequence, assist to rise and for anterior translation    Balance Overall balance assessment:  Needs assistance   Sitting balance-Leahy Scale: Poor       Standing balance-Leahy Scale: Poor                              ADL Overall ADL's : Needs assistance/impaired Eating/Feeding: NPO   Grooming: Maximal assistance   Upper Body Bathing: Maximal assistance   Lower Body Bathing: Maximal assistance   Upper Body Dressing : Maximal assistance   Lower Body Dressing: Total assistance   Toilet Transfer: Moderate assistance;+2 for physical assistance   Toileting- Clothing Manipulation and Hygiene: Total assistance       Functional mobility during ADLs: Moderate assistance;+2 for physical assistance       Vision Additional Comments: decreased visual attention. Will further assess   Perception     Praxis      Pertinent Vitals/Pain Pain Assessment: Faces Faces Pain Scale: Hurts little more Pain Location: abdomen Pain Descriptors / Indicators: Grimacing Pain Intervention(s): Limited activity within patient's tolerance     Hand Dominance Right   Extremity/Trunk Assessment Upper Extremity Assessment Upper Extremity Assessment: Generalized weakness (ROM appears WFL although currently limited due to apparent )   Lower Extremity Assessment Lower Extremity Assessment: Defer to PT evaluation   Cervical / Trunk Assessment Cervical / Trunk Assessment: Normal   Communication Communication Communication: Other (comment) (slurred speech)  HOH. Wears hearing aids. Not present at eval   Cognition Arousal/Alertness: Awake/alert Behavior During Therapy: Flat affect Overall Cognitive Status: Impaired/Different from baseline Area of Impairment: Orientation;Attention;Memory;Following commands;Safety/judgement;Awareness;Problem solving Orientation Level: Disoriented to;Place;Time Current Attention Level: Sustained Memory: Decreased recall of precautions;Decreased  short-term memory Following Commands: Follows one step commands inconsistently Safety/Judgement:  Decreased awareness of safety;Decreased awareness of deficits Awareness: Intellectual Problem Solving: Slow processing;Decreased initiation;Difficulty sequencing;Requires verbal cues;Requires tactile cues     General Comments       Exercises       Shoulder Instructions      Home Living Family/patient expects to be discharged to:: Private residence Living Arrangements: Spouse/significant other Available Help at Discharge: Family;Available 24 hours/day Type of Home: House Home Access: Stairs to enter [employer] of Steps: 4-5 Entrance Stairs-Rails: Right;Left;Can reach both Home Layout: One level     Bathroom Shower/Tub: Tub/shower unit;Curtain Shower/tub characteristics: Architectural technologist: Standard Bathroom Accessibility: Yes How Accessible: Accessible via walker Home Equipment: None          Prior Functioning/Environment Level of Independence: Independent        Comments: pt was driving, mowing and caring for himself. Wife reports recent diagnosis of dementia        OT Problem List: Decreased strength;Decreased activity tolerance;Impaired balance (sitting and/or standing);Decreased coordination;Decreased cognition;Decreased safety awareness;Decreased knowledge of use of DME or AE;Cardiopulmonary status limiting activity;Pain   OT Treatment/Interventions: Self-care/ADL training;Therapeutic exercise;Energy conservation;DME and/or AE instruction;Therapeutic activities;Cognitive remediation/compensation;Patient/family education;Balance training    OT Goals(Current goals can be found in the care plan section) Acute Rehab OT Goals Patient Stated Goal: be able to return home, speed boat OT Goal Formulation: With patient/family Time For Goal Achievement: 06/16/16 Potential to Achieve Goals: Good  OT Frequency: Min 2X/week   Barriers to D/C:            Co-evaluation PT/OT/SLP Co-Evaluation/Treatment: Yes Reason for Co-Treatment: Complexity of the  patient's impairments (multi-system involvement);For patient/therapist safety;To address functional/ADL transfers PT goals addressed during session: Mobility/safety with mobility;Balance;Proper use of DME OT goals addressed during session: ADL's and self-care      End of Session Equipment Utilized During Treatment: Gait belt  Activity Tolerance: Patient tolerated treatment well Patient left: Other (comment) (in wc with nsg)   Time: [id number] OT Time Calculation (min): 24 min Charges:  OT General Charges $OT Visit: 1 Procedure OT Evaluation $OT Eval Moderate Complexity: 1 Procedure G-Codes:    Marcelina Mclaurin,HILLARY 29-Jun-2016, 1:14 PM   [facility], OT/L  [phone] Jun 29, 2016

## 2016-06-02 NOTE — Progress Notes (Signed)
PT Cancellation Note  Patient Details Name: ABDULHADI HAMILL MRN: [id number] DOB: 1941-04-21   Cancelled Treatment:    Reason Eval/Treat Not Completed: Medical issues which prohibited therapy (pt on 30 min bedrest, will attempt later today)   [name] [name] 06/02/2016, 8:46 AM  Elwyn Reach, [place]

## 2016-06-03 ENCOUNTER — Inpatient Hospital Stay (HOSPITAL_COMMUNITY): Payer: Medicare Other

## 2016-06-03 LAB — TYPE AND SCREEN
ABO/RH(D): B POS
Antibody Screen: NEGATIVE
UNIT DIVISION: 0
UNIT DIVISION: 0
Unit division: 0
Unit division: 0

## 2016-06-03 LAB — BASIC METABOLIC PANEL
Anion gap: 7 (ref 5–15)
BUN: 17 mg/dL (ref 6–20)
CHLORIDE: 103 mmol/L (ref 101–111)
CO2: 26 mmol/L (ref 22–32)
Calcium: 8.5 mg/dL — ABNORMAL LOW (ref 8.9–10.3)
Creatinine, Ser: 1.12 mg/dL (ref 0.61–1.24)
GFR calc non Af Amer: >60 mL/min (ref >60)
Glucose, Bld: 126 mg/dL — ABNORMAL HIGH (ref 65–99)
POTASSIUM: 4.1 mmol/L (ref 3.5–5.1)
SODIUM: 136 mmol/L (ref 135–145)

## 2016-06-03 LAB — CBC
HEMATOCRIT: 35.4 % — AB (ref 39.0–52.0)
Hemoglobin: 11.8 g/dL — ABNORMAL LOW (ref 13.0–17.0)
MCH: 30.8 pg (ref 26.0–34.0)
MCHC: 33.3 g/dL (ref 30.0–36.0)
MCV: 92.4 fL (ref 78.0–100.0)
PLATELETS: 146 10*3/uL — AB (ref 150–400)
RBC: 3.83 MIL/uL — AB (ref 4.22–5.81)
RDW: 13.5 % (ref 11.5–15.5)
WBC: 14.2 10*3/uL — AB (ref 4.0–10.5)

## 2016-06-03 LAB — BLOOD GAS, ARTERIAL
ACID-BASE EXCESS: 2 mmol/L (ref 0.0–2.0)
Bicarbonate: 25.4 mmol/L (ref 20.0–28.0)
DRAWN BY: 205171
O2 Content: 2 L/min
O2 Saturation: 85.1 %
PH ART: 7.47 — AB (ref 7.350–7.450)
Patient temperature: 98.6
pCO2 arterial: 35.3 mmHg (ref 32.0–48.0)
pO2, Arterial: 49.3 mmHg — ABNORMAL LOW (ref 83.0–108.0)

## 2016-06-03 LAB — MAGNESIUM: Magnesium: 1.6 mg/dL — ABNORMAL LOW (ref 1.7–2.4)

## 2016-06-03 MED ORDER — MAGNESIUM SULFATE 4 GM/100ML IV SOLN
4.0000 g | Freq: Once | INTRAVENOUS | Status: AC
  Administered 2016-06-03: 4 g via INTRAVENOUS
  Filled 2016-06-03: qty 100

## 2016-06-03 NOTE — Progress Notes (Addendum)
AAA Progress Note    06/03/2016 7:56 AM 2 Days Post-Op  Subjective:  Agitated this morning; now wearing mittens  Tm 99.6 now afebrile HR  80's NSR 0000000 systolic 99991111 0000000  Vitals:   06/03/16 0019 06/03/16 0524  BP:  (!) 163/79  Pulse:  87  Resp:  18  Temp: 99.6 F (37.6 C) 97.5 F (36.4 C)    Physical Exam: Cardiac:  regular Lungs:  Clear bilaterally Abdomen:  Soft, NT/ND; more bowel sounds present today Incisions:  Clean and dry  Extremities:  Easily palpable pedal pulses bilaterally Neuro: agitated; follows commands and moves all extremities.  Will not answer questions today.  Wife states he has been talking all evening about work in the past.  CBC    Component Value Date/Time   WBC 14.2 (H) 06/03/2016 0306   RBC 3.83 (L) 06/03/2016 0306   HGB 11.8 (L) 06/03/2016 0306   HCT 35.4 (L) 06/03/2016 0306   PLT 146 (L) 06/03/2016 0306   MCV 92.4 06/03/2016 0306   MCH 30.8 06/03/2016 0306   MCHC 33.3 06/03/2016 0306   RDW 13.5 06/03/2016 0306    BMET    Component Value Date/Time   NA 136 06/03/2016 0306   K 4.1 06/03/2016 0306   CL 103 06/03/2016 0306   CO2 26 06/03/2016 0306   GLUCOSE 126 (H) 06/03/2016 0306   BUN 17 06/03/2016 0306   CREATININE 1.12 06/03/2016 0306   CALCIUM 8.5 (L) 06/03/2016 0306   GFRNONAA >60 06/03/2016 0306   GFRAA >60 06/03/2016 0306    INR    Component Value Date/Time   INR 1.33 06/01/2016 1306     Intake/Output Summary (Last 24 hours) at 06/03/16 0756 Last data filed at 06/03/16 0523  Gross per 24 hour  Intake              630 ml  Output              840 ml  Net             -210 ml    NGT output:  30cc/hr   Assessment/Plan:  76 y.o. male is s/p  Supraceliac aorta to celiac and superior mesenteric artery bypass 2 Days Post-Op  -pt more agitated this morning and wearing mittens for safely; he does follow commands; his wife states that he has been talking all night about work in the past.  Possible  anesthesia has acutely exacerbated early dementia??  May need ativan-will d/w Dr. Oneida Alar -he does have bowel sounds this am.  Pt states that he has passed flatus, but feel this is unreliable at this point.   Dulcolax this am.  Continue strict npo until he has BM.  NGT with minimal output -O2 sats remaining stable on 3LO2NC -continue OT/PT-pt may be potential candidate for CIR - will see how his post op course progresses -acute blood loss anemia relatively stable-down slightly today-may be dilutional.  Probably okay to start SQ heparin for DVT prophylaxis, but will d/w Dr. Oneida Alar -leukocytosis improved -continue SCD's.      Leontine Locket, PA-C [facility] [phone] 06/03/2016 7:56 AM   Confused.  Has some baseline mild dementia according to wife sometimes gets lost when driving.  No alcohol consumption over last 3 months.  Oxygen saturation is reasonable  Will check ABG.  Correct hypomagnesemia Leukocytosis improving most likely reactive Abdomen soft so doubt issue with bypass Will try to normalize sleep wake Will order some Haldol if ABG  is normal also check chest xray Keep NG until more bowel function high risk for aspiration currently Recheck labs cmet cbc amylase magnesium in am  [name], MD [facility] [place] Office: [phone] Pager: [phone]

## 2016-06-03 NOTE — Progress Notes (Signed)
Pt seen this evening still confused.  ABG and chest xray reviewed.  Will give haldol this evening if remains agitated Encouraged family to continue to reorient him prevent him sleeping daytime  [name], MD [facility] [place]: [phone] Pager: [phone]

## 2016-06-03 NOTE — Progress Notes (Signed)
Inpatient Rehabilitation  I met with the patient, his wife and his son at the bedside to discuss a possible IP Rehab admission as pt's course progresses.  Pt. in bed with mittens and appears quite confused.  I explained to wife and son about CIR and provided informational booklets.  We will follow along for pt's progress and potential appropriateness for IP Rehab.  Pt's [employer] Medicare would have to provide authorization for a CIR admission.      [place] Admissions Coordinator Cell [phone] Office [phone]

## 2016-06-04 LAB — COMPREHENSIVE METABOLIC PANEL
ALT: 44 U/L (ref 17–63)
AST: 69 U/L — AB (ref 15–41)
Albumin: 3.1 g/dL — ABNORMAL LOW (ref 3.5–5.0)
Alkaline Phosphatase: 44 U/L (ref 38–126)
Anion gap: 7 (ref 5–15)
BUN: 24 mg/dL — AB (ref 6–20)
CO2: 26 mmol/L (ref 22–32)
Calcium: 8.2 mg/dL — ABNORMAL LOW (ref 8.9–10.3)
Chloride: 103 mmol/L (ref 101–111)
Creatinine, Ser: 1.09 mg/dL (ref 0.61–1.24)
GFR calc Af Amer: >60 mL/min (ref >60)
Glucose, Bld: 117 mg/dL — ABNORMAL HIGH (ref 65–99)
POTASSIUM: 4 mmol/L (ref 3.5–5.1)
Sodium: 136 mmol/L (ref 135–145)
Total Bilirubin: 1.2 mg/dL (ref 0.3–1.2)
Total Protein: 5.8 g/dL — ABNORMAL LOW (ref 6.5–8.1)

## 2016-06-04 LAB — CBC
HEMATOCRIT: 34.9 % — AB (ref 39.0–52.0)
HEMOGLOBIN: 11.8 g/dL — AB (ref 13.0–17.0)
MCH: 31 pg (ref 26.0–34.0)
MCHC: 33.8 g/dL (ref 30.0–36.0)
MCV: 91.6 fL (ref 78.0–100.0)
Platelets: 145 10*3/uL — ABNORMAL LOW (ref 150–400)
RBC: 3.81 MIL/uL — AB (ref 4.22–5.81)
RDW: 13.3 % (ref 11.5–15.5)
WBC: 15 10*3/uL — AB (ref 4.0–10.5)

## 2016-06-04 LAB — AMYLASE: Amylase: 47 U/L (ref 28–100)

## 2016-06-04 LAB — MAGNESIUM: Magnesium: 2.2 mg/dL (ref 1.7–2.4)

## 2016-06-04 MED ORDER — POTASSIUM CHLORIDE 20 MEQ/15ML (10%) PO SOLN
20.0000 meq | Freq: Once | ORAL | Status: AC
  Administered 2016-06-04: 20 meq
  Filled 2016-06-04: qty 15

## 2016-06-04 MED ORDER — FUROSEMIDE 10 MG/ML IJ SOLN
20.0000 mg | Freq: Once | INTRAMUSCULAR | Status: AC
  Administered 2016-06-04: 20 mg via INTRAVENOUS
  Filled 2016-06-04: qty 2

## 2016-06-04 NOTE — Progress Notes (Signed)
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 PMHx: Right CEA, COPD, HLD, HTN, glaucoma    PT Comments    Pt pleasant, reports no pain and continues to need moderate cues to not pull at NG tube. Pt with sats 91% on 5-6L throughout, HR 85. Pt with improved gait but continues to need heavy assist for safety and stability with gait. Will continue to follow and recommend CIR for maximized independence to decrease caregiver burden.    Follow Up Recommendations  CIR;Supervision/Assistance - 24 hour     Equipment Recommendations       Recommendations for Other Services       Precautions / Restrictions Precautions Precautions: Fall Precaution Comments: NGtube, watch sats    Mobility  Bed Mobility Overal bed mobility: Needs Assistance Bed Mobility: Rolling;Sidelying to Sit Rolling: Min assist Sidelying to sit: Min assist       General bed mobility comments: cues for sequence  tactile cueing and assist to roll and elevate trunk.  Transfers Overall transfer level: Needs assistance   Transfers: Sit to/from Stand Sit to Stand: Min assist         General transfer comment: cues for hand placement and sequence, assist to rise and for anterior translation  Ambulation/Gait Ambulation/Gait assistance: Mod assist;+2 safety/equipment Ambulation Distance (Feet): 200 Feet Assistive device: Rolling walker (2 wheeled) Gait Pattern/deviations: Step-through pattern;Decreased stride length;Narrow base of support   Gait velocity interpretation: Below normal speed for age/gender General Gait Details: pt able to utilize RW for gait today with narrow BOS and maintained moderate right lean throughout with support for balance and safety with gait, chair to follow. Max multimodal directional and postural cues  throughout as well as cues for pursed lip breathing   Stairs            Wheelchair Mobility    Modified Rankin (Stroke Patients Only)       Balance Overall balance assessment: Needs assistance   Sitting balance-Leahy Scale: Fair       Standing balance-Leahy Scale: Poor                      Cognition Arousal/Alertness: Awake/alert Behavior During Therapy: Flat affect Overall Cognitive Status: Impaired/Different from baseline Area of Impairment: Orientation;Attention;Memory;Following commands;Safety/judgement;Awareness;Problem solving Orientation Level: Disoriented to;Place;Time Current Attention Level: Sustained Memory: Decreased short-term memory Following Commands: Follows one step commands with increased time Safety/Judgement: Decreased awareness of safety;Decreased awareness of deficits   Problem Solving: Slow processing;Decreased initiation;Difficulty sequencing;Requires verbal cues;Requires tactile cues      Exercises General Exercises - Lower Extremity Long Arc Quad: AROM;Both;Seated;15 reps Hip Flexion/Marching: AROM;Both;Seated;15 reps    General Comments        Pertinent Vitals/Pain Pain Assessment: No/denies pain    Home Living                      Prior Function            PT Goals (current goals can now be found in the care plan section) Progress towards PT goals: Progressing toward goals    Frequency           PT Plan Current plan remains appropriate    Co-evaluation             End of Session Equipment Utilized During Treatment: Gait belt;Oxygen Activity  Tolerance: Patient tolerated treatment well Patient left: in chair;with call bell/phone within reach;with family/visitor present;with chair alarm set     Time: [id number] PT Time Calculation (min) (ACUTE ONLY): 30 min  Charges:  $Gait Training: 8-22 mins $Therapeutic Exercise: 8-22 mins                    G Codes:      Emilianna Barlowe B  [place] 07-02-16, 9:36 AM Elwyn Reach, [place]

## 2016-06-04 NOTE — Progress Notes (Addendum)
Inpatient Rehabilitation  Following along for potential IP Rehab admission.  Note improved participation and tolerance with PT today.  However, also note elevated temperature and WBC today.  Will initiate insurance authorization tomorrow in hopes of medical readiness early next week.  Plan to follow up with team when [facility] Medicare has a decision.  Please call with questions.   Carmelia Roller., CCC/SLP Admission Coordinator  [place]  Cell [phone]

## 2016-06-04 NOTE — Progress Notes (Signed)
OT Cancellation Note  Patient Details Name: Keith Gibson MRN: [id number] DOB: 1940-12-23   Cancelled Treatment:    Reason Eval/Treat Not Completed: Fatigue/lethargy limiting ability to participate. Per family, pt just able to fall asleep and has not slept much. Requesting therapy check back at a later time. Will follow up as time allows.  Binnie Kand M.S., OTR/L Pager: [phone]  06/04/2016, 2:38 PM

## 2016-06-04 NOTE — Progress Notes (Addendum)
[facility]   Subjective  - Alert and oriented this am.   Objective (!) 125/103 80 98.6 F (37 C) (Oral) 20 93%  Intake/Output Summary (Last 24 hours) at 06/04/16 0745 Last data filed at 06/04/16 0600  Gross per 24 hour  Intake              600 ml  Output              950 ml  Net             -350 ml    Very minimal BS, belly soft non tender Palpable DP 2+ Heart  Lungs on O2, non labored breathing  Assessment/Planning: POD # 3 Supraceliac aorta to celiac and superior mesenteric artery bypass   Oriented this am family in room Minimal BS NG output 25 total last 24 hours UO good will D/C foley HGB stable, WBC elevated 15.0, Mg stable after replacement 2.2 Will likely D/C NG later today and start ice chips.  Will discuss with Dr. Carvel Getting, EMMA [facility] 06/04/2016 7:45 AM -- Less confusion Abdomen soft appropriate tenderness NG fairly minimal but no real gut function yet and high risk for aspiration so leave for now Needs incentive spirometer 1 dose of Lasix today then d/c foley Hypomagnesemia corrected Leukocytosis unchanged probably reactive If mental status continues to improve maybe d/c NG tomorrow   [name], MD [facility] [place]: [phone] Pager: [phone]  Laboratory Lab Results:  Recent Labs  06/03/16 0306 06/04/16 0212  WBC 14.2* 15.0*  HGB 11.8* 11.8*  HCT 35.4* 34.9*  PLT 146* 145*   BMET  Recent Labs  06/03/16 0306 06/04/16 0212  NA 136 136  K 4.1 4.0  CL 103 103  CO2 26 26  GLUCOSE 126* 117*  BUN 17 24*  CREATININE 1.12 1.09  CALCIUM 8.5* 8.2*    COAG Lab Results  Component Value Date   INR 1.33 06/01/2016   INR 0.96 05/27/2016   No results found for: PTT

## 2016-06-05 ENCOUNTER — Inpatient Hospital Stay (HOSPITAL_COMMUNITY): Payer: Medicare Other

## 2016-06-05 LAB — CBC
HEMATOCRIT: 32.6 % — AB (ref 39.0–52.0)
Hemoglobin: 11.2 g/dL — ABNORMAL LOW (ref 13.0–17.0)
MCH: 31.2 pg (ref 26.0–34.0)
MCHC: 34.4 g/dL (ref 30.0–36.0)
MCV: 90.8 fL (ref 78.0–100.0)
PLATELETS: 158 10*3/uL (ref 150–400)
RBC: 3.59 MIL/uL — AB (ref 4.22–5.81)
RDW: 13.2 % (ref 11.5–15.5)
WBC: 12.5 10*3/uL — AB (ref 4.0–10.5)

## 2016-06-05 LAB — BASIC METABOLIC PANEL
ANION GAP: 9 (ref 5–15)
BUN: 28 mg/dL — AB (ref 6–20)
CHLORIDE: 103 mmol/L (ref 101–111)
CO2: 25 mmol/L (ref 22–32)
Calcium: 8.3 mg/dL — ABNORMAL LOW (ref 8.9–10.3)
Creatinine, Ser: 0.99 mg/dL (ref 0.61–1.24)
GFR calc Af Amer: >60 mL/min (ref >60)
GLUCOSE: 122 mg/dL — AB (ref 65–99)
POTASSIUM: 3.8 mmol/L (ref 3.5–5.1)
Sodium: 137 mmol/L (ref 135–145)

## 2016-06-05 MED ORDER — HALOPERIDOL LACTATE 5 MG/ML IJ SOLN
2.5000 mg | Freq: Four times a day (QID) | INTRAMUSCULAR | Status: DC | PRN
  Administered 2016-06-05 – 2016-06-06 (×4): 2.5 mg via INTRAVENOUS
  Filled 2016-06-05 (×4): qty 1

## 2016-06-05 MED ORDER — HALOPERIDOL LACTATE 5 MG/ML IJ SOLN
5.0000 mg | Freq: Once | INTRAMUSCULAR | Status: AC
  Administered 2016-06-05: 5 mg via INTRAVENOUS
  Filled 2016-06-05: qty 1

## 2016-06-05 NOTE — Progress Notes (Signed)
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  Pt currently being transferred off floor due to medical status change. Per Malnutrition Screening Tool Report, pt has had 20 lb weight loss in the last year. Also has been eating poorly because of a decreased appetite. Currently NPO >> possible clear liquids in AM. Unable to complete Nutrition Focused Physical Exam at this time.  Diet Order:  Diet NPO time specified  Skin:  Reviewed, no issues  Last BM:  1/14  Height:   Ht Readings from Last 1 Encounters:  06/01/16 5\' 7"  (1.702 m)    Weight:   Wt Readings from Last 1 Encounters:  06/01/16 151 lb (68.5 kg)    Ideal Body Weight:  67.2 kg  BMI:  Body mass index is 23.65 kg/m.  Estimated Nutritional Needs:   Kcal:  1700-1900  Protein:  80-90 gm  Fluid:  1.7-1.9 L  EDUCATION NEEDS:   No education needs identified at this time  Arthur Holms, RD, LDN Pager #: [phone] After-Hours Pager #: [phone]

## 2016-06-05 NOTE — Progress Notes (Addendum)
  [facility] Progress Note  Subjective  - POD #4  Family at bedside who say he keeps trying to pull his NG out.  They say he's been passing flatus since last night. Patient denies any abdominal pain or nausea.   Objective Vitals:   06/04/16 2138 06/05/16 0417  BP: (!) 162/98 (!) 156/85  Pulse: 78 87  Resp: (!) 22 20  Temp: 98.6 F (37 C) 98.4 F (36.9 C)    Intake/Output Summary (Last 24 hours) at 06/05/16 0826 Last data filed at 06/05/16 0700  Gross per 24 hour  Intake                0 ml  Output             1070 ml  Net            -1070 ml    Oriented to person, but not to place and situation.  Abdomen soft and non tender. Midline incision intact.  2+ DP and PT pulses bilaterally   Assessment/Planning: 76 y.o. male is s/p: Supraceliac aorta to celiac and superior mesenteric artery bypass  4 Days Post-Op   Still confused today. Did not sleep at all last night. Family holding patient's arms to prevent him from pulling NG tube out. Passing flatus now, so potentially will d/c NG today. Keep NPO for now. Will discuss with Dr. Oneida Alar.  Leukocytosis improving. Good UOP.  Continue to mobilize.  Incentive spirometry.  DVT prophylaxis:  SCDs  Keith Gibson 06/05/2016 8:26 AM -- Still agitated confused Will d/c NG today Will probably give some Haldol this evening to help with sleep BUN/Cr suggest he is fully diuresed Leukocytosis slowly resolving Will consider ice chips clears evening if no nausea   Keith Hinds, MD [facility] [place]: [phone] Pager: [phone]  Laboratory CBC    Component Value Date/Time   WBC 12.5 (H) 06/05/2016 0318   HGB 11.2 (L) 06/05/2016 0318   HCT 32.6 (L) 06/05/2016 0318   PLT 158 06/05/2016 0318    BMET    Component Value Date/Time   NA 137 06/05/2016 0318   K 3.8 06/05/2016 0318   CL 103 06/05/2016 0318   CO2 25 06/05/2016 0318   GLUCOSE 122 (H) 06/05/2016 0318   BUN 28 (H) 06/05/2016 0318   CREATININE 0.99 06/05/2016 0318   CALCIUM 8.3 (L) 06/05/2016 0318   GFRNONAA >60 06/05/2016 0318   GFRAA >60 06/05/2016 0318    COAG Lab Results  Component Value Date   INR 1.33 06/01/2016   INR 0.96 05/27/2016   No results found for: PTT  Antibiotics Anti-infectives    Start     Dose/Rate Route Frequency Ordered Stop   06/01/16 1900  cefUROXime (ZINACEF) 1.5 g in dextrose 5 % 50 mL IVPB     1.5 g 100 mL/hr over 30 Minutes Intravenous Every 12 hours 06/01/16 1557 06/02/16 0830   06/01/16 0545  cefUROXime (ZINACEF) 1.5 g in dextrose 5 % 50 mL IVPB     1.5 g 100 mL/hr over 30 Minutes Intravenous 30 min pre-op 06/01/16 0545 06/01/16 [place], PA-C [facility] Office: [phone] Pager: [phone] 06/05/2016 8:26 AM

## 2016-06-05 NOTE — Progress Notes (Signed)
Report called to nurse on 2S.

## 2016-06-05 NOTE — Care Management Important Message (Signed)
Important Message  Patient Details  Name: Keith Gibson MRN: [id number] Date of Birth: 1941-03-27   Medicare Important Message Given:  Yes    [name] May 06/05/2016, 4:57 PM

## 2016-06-05 NOTE — Progress Notes (Signed)
Inpatient Rehabilitation  Note changes in medical status.  Please plan for my co-worker Gerlean Ren to follow up Monday 06/08/16 for medical stability prior to insurance authorization for IP Rehab.    Carmelia Roller., CCC/SLP Admission Coordinator  [place]  Cell [phone]

## 2016-06-05 NOTE — Progress Notes (Signed)
Pt more agitated with marginal oxygen saturation on 4L O2, now up to 7L.  Will try one injection of haldol but not if not improved may need to move to stepdown or ICU  [name]

## 2016-06-05 NOTE — Progress Notes (Signed)
Pt received 5 mg Haldol.  Still agitated but less so.  Will transfer to stepdown for closer monitoring due to post op delirium/confusion  Keep NPO for now since he is very agitated No nausea so far.  Clears tomorrow morning if less confused  [name], MD [facility] [place] Office: [phone] Pager: [phone]

## 2016-06-05 NOTE — Progress Notes (Signed)
OT Cancellation Note  Patient Details Name: Keith Gibson MRN: [id number] DOB: April 02, 1941   Cancelled Treatment:    Pt being transferred to step down unit due to medical status change. Will reassess Monday if appropriate. [place], OT/L  [phone] 06/05/2016 06/05/2016, 2:55 PM

## 2016-06-06 LAB — BASIC METABOLIC PANEL
Anion gap: 6 (ref 5–15)
BUN: 25 mg/dL — AB (ref 6–20)
CHLORIDE: 106 mmol/L (ref 101–111)
CO2: 28 mmol/L (ref 22–32)
Calcium: 8.4 mg/dL — ABNORMAL LOW (ref 8.9–10.3)
Creatinine, Ser: 0.91 mg/dL (ref 0.61–1.24)
GFR calc Af Amer: >60 mL/min (ref >60)
Glucose, Bld: 114 mg/dL — ABNORMAL HIGH (ref 65–99)
POTASSIUM: 3.4 mmol/L — AB (ref 3.5–5.1)
Sodium: 140 mmol/L (ref 135–145)

## 2016-06-06 LAB — CBC
HCT: 33.2 % — ABNORMAL LOW (ref 39.0–52.0)
Hemoglobin: 11.1 g/dL — ABNORMAL LOW (ref 13.0–17.0)
MCH: 30.7 pg (ref 26.0–34.0)
MCHC: 33.4 g/dL (ref 30.0–36.0)
MCV: 92 fL (ref 78.0–100.0)
PLATELETS: 155 10*3/uL (ref 150–400)
RBC: 3.61 MIL/uL — AB (ref 4.22–5.81)
RDW: 13.2 % (ref 11.5–15.5)
WBC: 7.9 10*3/uL (ref 4.0–10.5)

## 2016-06-06 MED ORDER — ALBUTEROL SULFATE (2.5 MG/3ML) 0.083% IN NEBU
2.5000 mg | INHALATION_SOLUTION | Freq: Four times a day (QID) | RESPIRATORY_TRACT | Status: DC | PRN
  Administered 2016-06-06 – 2016-06-09 (×3): 2.5 mg via RESPIRATORY_TRACT
  Filled 2016-06-06 (×3): qty 3

## 2016-06-06 MED ORDER — LORAZEPAM 2 MG/ML IJ SOLN
0.5000 mg | Freq: Four times a day (QID) | INTRAMUSCULAR | Status: DC | PRN
  Administered 2016-06-06 – 2016-06-07 (×3): 0.5 mg via INTRAVENOUS
  Filled 2016-06-06 (×3): qty 1

## 2016-06-06 MED ORDER — HALOPERIDOL LACTATE 5 MG/ML IJ SOLN
5.0000 mg | Freq: Four times a day (QID) | INTRAMUSCULAR | Status: DC | PRN
  Administered 2016-06-06: 2.5 mg via INTRAVENOUS
  Administered 2016-06-08: 5 mg via INTRAVENOUS
  Filled 2016-06-06 (×2): qty 1

## 2016-06-06 NOTE — Progress Notes (Signed)
06/06/2016 1130 Pt. With continued extreme confusion and agitation despite family presence and ordered interventions in place. Dr. Oneida Alar paged and made aware. MD to place orders. Will continue to closely monitor patient.  Archita Lomeli, [place] Lime

## 2016-06-06 NOTE — Progress Notes (Signed)
06/06/2016 1515 Pt. With continued agitation despite ordered interventions in place. Noted pt. Only voided 150 cc urine this shift. Bladder scan performed showing 900 ml residual urine in bladder. Dr. Oneida Alar paged and made aware. Verbal order received ok to place foley. 900 ml dark amber urine returned after foley placement. Pt. Calm/resting at this time. Will continue to closely monitor patient.  Keith Gibson, Arville Lime

## 2016-06-06 NOTE — Progress Notes (Signed)
[facility]  Subjective  - still confused but more alert haldol every 6 last night   Objective 134/86 64 97.8 F (36.6 C) (Oral) 10 93%  Intake/Output Summary (Last 24 hours) at 06/06/16 0710 Last data filed at 06/06/16 0700  Gross per 24 hour  Intake              650 ml  Output              375 ml  Net              275 ml   Abdomen soft incision healing 2+ femorals A x O person place some to situation  Assessment/Planning: S/p mesenteric bypass alert enough to start clears.  Advance to heart healthy tomorrow if tolerates Head CT negative Continue Haldol for now hopefully mental status will clear over next few days Leukocytosis resolved  Keith Gibson 06/06/2016 7:10 AM --  Laboratory Lab Results:  Recent Labs  06/05/16 0318 06/06/16 0222  WBC 12.5* 7.9  HGB 11.2* 11.1*  HCT 32.6* 33.2*  PLT 158 155   BMET  Recent Labs  06/05/16 0318 06/06/16 0222  NA 137 140  K 3.8 3.4*  CL 103 106  CO2 25 28  GLUCOSE 122* 114*  BUN 28* 25*  CREATININE 0.99 0.91  CALCIUM 8.3* 8.4*    COAG Lab Results  Component Value Date   INR 1.33 06/01/2016   INR 0.96 05/27/2016   No results found for: PTT

## 2016-06-07 LAB — BASIC METABOLIC PANEL
ANION GAP: 7 (ref 5–15)
BUN: 22 mg/dL — ABNORMAL HIGH (ref 6–20)
CALCIUM: 8.1 mg/dL — AB (ref 8.9–10.3)
CO2: 27 mmol/L (ref 22–32)
Chloride: 106 mmol/L (ref 101–111)
Creatinine, Ser: 0.92 mg/dL (ref 0.61–1.24)
GLUCOSE: 105 mg/dL — AB (ref 65–99)
POTASSIUM: 3.1 mmol/L — AB (ref 3.5–5.1)
SODIUM: 140 mmol/L (ref 135–145)

## 2016-06-07 LAB — CBC
HCT: 32.1 % — ABNORMAL LOW (ref 39.0–52.0)
Hemoglobin: 10.8 g/dL — ABNORMAL LOW (ref 13.0–17.0)
MCH: 30.7 pg (ref 26.0–34.0)
MCHC: 33.6 g/dL (ref 30.0–36.0)
MCV: 91.2 fL (ref 78.0–100.0)
Platelets: 186 10*3/uL (ref 150–400)
RBC: 3.52 MIL/uL — AB (ref 4.22–5.81)
RDW: 13 % (ref 11.5–15.5)
WBC: 7.2 10*3/uL (ref 4.0–10.5)

## 2016-06-07 MED ORDER — POTASSIUM CHLORIDE CRYS ER 20 MEQ PO TBCR
40.0000 meq | EXTENDED_RELEASE_TABLET | Freq: Two times a day (BID) | ORAL | Status: DC
  Administered 2016-06-07 – 2016-06-13 (×13): 40 meq via ORAL
  Filled 2016-06-07 (×14): qty 2

## 2016-06-07 MED ORDER — POTASSIUM CHLORIDE ER 10 MEQ PO TBCR
80.0000 meq | EXTENDED_RELEASE_TABLET | Freq: Every day | ORAL | Status: DC
  Filled 2016-06-07: qty 8

## 2016-06-07 NOTE — Progress Notes (Signed)
[facility]  Subjective  - a little less confused   Objective (!) 168/85 76 98.1 F (36.7 C) (Oral) 20 100%  Intake/Output Summary (Last 24 hours) at 06/07/16 [id number] Last data filed at 06/07/16 0600  Gross per 24 hour  Intake             1400 ml  Output             1760 ml  Net             -360 ml   abomen soft non tender incision healing Neuro knows he is in hospital and procedure details does not recognize wife and son  Assessment/Planning: Delirium slowly clearing continue haldol/ativan Urinary retention continue foley today hopefully d/c tomorrow Advance to heart healthy diet Hypokalemia replete  [name] 06/07/2016 9:24 AM --  Laboratory Lab Results:  Recent Labs  06/06/16 0222 06/07/16 0227  WBC 7.9 7.2  HGB 11.1* 10.8*  HCT 33.2* 32.1*  PLT 155 186   BMET  Recent Labs  06/06/16 0222 06/07/16 0227  NA 140 140  K 3.4* 3.1*  CL 106 106  CO2 28 27  GLUCOSE 114* 105*  BUN 25* 22*  CREATININE 0.91 0.92  CALCIUM 8.4* 8.1*    COAG Lab Results  Component Value Date   INR 1.33 06/01/2016   INR 0.96 05/27/2016   No results found for: PTT

## 2016-06-07 NOTE — Progress Notes (Signed)
Pt assisted to ambulate in hall.  Tried walking crossing feet over.  When advised to not cross feet over each other.  Pt seemed to not comprehend what was being said to him.  Almost seems to have problem processing words spoken to him. Pt oriented to name, place, situation.  Will continue to monitor closely.

## 2016-06-08 DIAGNOSIS — R410 Disorientation, unspecified: Secondary | ICD-10-CM

## 2016-06-08 DIAGNOSIS — J449 Chronic obstructive pulmonary disease, unspecified: Secondary | ICD-10-CM

## 2016-06-08 DIAGNOSIS — N390 Urinary tract infection, site not specified: Secondary | ICD-10-CM

## 2016-06-08 LAB — URINALYSIS, ROUTINE W REFLEX MICROSCOPIC
Bilirubin Urine: NEGATIVE
GLUCOSE, UA: NEGATIVE mg/dL
Ketones, ur: NEGATIVE mg/dL
NITRITE: NEGATIVE
PH: 6 (ref 5.0–8.0)
Protein, ur: NEGATIVE mg/dL
SPECIFIC GRAVITY, URINE: 1.012 (ref 1.005–1.030)

## 2016-06-08 LAB — CBC
HEMATOCRIT: 31.9 % — AB (ref 39.0–52.0)
Hemoglobin: 10.7 g/dL — ABNORMAL LOW (ref 13.0–17.0)
MCH: 30.4 pg (ref 26.0–34.0)
MCHC: 33.5 g/dL (ref 30.0–36.0)
MCV: 90.6 fL (ref 78.0–100.0)
PLATELETS: 193 10*3/uL (ref 150–400)
RBC: 3.52 MIL/uL — AB (ref 4.22–5.81)
RDW: 12.9 % (ref 11.5–15.5)
WBC: 7.4 10*3/uL (ref 4.0–10.5)

## 2016-06-08 LAB — BASIC METABOLIC PANEL
ANION GAP: 7 (ref 5–15)
BUN: 17 mg/dL (ref 6–20)
CALCIUM: 8.2 mg/dL — AB (ref 8.9–10.3)
CO2: 26 mmol/L (ref 22–32)
Chloride: 105 mmol/L (ref 101–111)
Creatinine, Ser: 0.96 mg/dL (ref 0.61–1.24)
Glucose, Bld: 105 mg/dL — ABNORMAL HIGH (ref 65–99)
POTASSIUM: 3.9 mmol/L (ref 3.5–5.1)
Sodium: 138 mmol/L (ref 135–145)

## 2016-06-08 MED ORDER — PIPERACILLIN-TAZOBACTAM 3.375 G IVPB
3.3750 g | Freq: Three times a day (TID) | INTRAVENOUS | Status: DC
  Administered 2016-06-09 – 2016-06-10 (×5): 3.375 g via INTRAVENOUS
  Filled 2016-06-08 (×9): qty 50

## 2016-06-08 MED ORDER — DEXTROSE 5 % IV SOLN
1.0000 g | INTRAVENOUS | Status: DC
  Filled 2016-06-08: qty 10

## 2016-06-08 MED ORDER — ENSURE ENLIVE PO LIQD
237.0000 mL | Freq: Two times a day (BID) | ORAL | Status: DC
  Administered 2016-06-08 – 2016-06-15 (×11): 237 mL via ORAL

## 2016-06-08 MED ORDER — THIAMINE HCL 100 MG/ML IJ SOLN
500.0000 mg | Freq: Three times a day (TID) | INTRAVENOUS | Status: AC
  Administered 2016-06-08 – 2016-06-11 (×9): 500 mg via INTRAVENOUS
  Filled 2016-06-08 (×10): qty 5

## 2016-06-08 MED ORDER — DONEPEZIL HCL 10 MG PO TABS
10.0000 mg | ORAL_TABLET | Freq: Every day | ORAL | Status: DC
  Administered 2016-06-08 – 2016-06-14 (×7): 10 mg via ORAL
  Filled 2016-06-08 (×7): qty 1

## 2016-06-08 MED ORDER — HALOPERIDOL LACTATE 5 MG/ML IJ SOLN
2.5000 mg | Freq: Four times a day (QID) | INTRAMUSCULAR | Status: DC | PRN
  Administered 2016-06-10 – 2016-06-13 (×2): 2.5 mg via INTRAVENOUS
  Filled 2016-06-08 (×2): qty 1

## 2016-06-08 NOTE — Consult Note (Signed)
Name: Keith Gibson MRN: [id number] DOB: 1941-01-27    ADMISSION DATE:  06/01/2016 CONSULTATION DATE: 1/22  REFERRING MD :  Oneida Alar   CHIEF COMPLAINT:  Delirium   BRIEF PATIENT DESCRIPTION:  76 year old male w/ h/o early dementia. Underwent planned supraceliac aorta to celiac and superior mesenteric bypass on 1/15. PCCM asked to see re: additional recs for treating delirium on 1/22/   SIGNIFICANT EVENTS    STUDIES:  CT head 1/19: mild cerebral atrophy w/ chronic appearing small vessel ischemic disease.    HISTORY OF PRESENT ILLNESS:  This is a 76 year old male w/ significant h/o dementia. Was admitted on 1/15 for supraceliac aorta to celiac and superior mesenteric bypass to treat chronic mesenteric ischemia. His OR course was unremarkable.  -Post-op course complicated by increased agitation and worsening delirium first noted on 1/17. Started getting Haldol PRN. Got lasix for pulmonary edema  -On 1/19 began to have increased O2 requirements , transferred to SDU for close obs of agitation.  -1/20 foley cath placed for urinary retention of 921ml. Pt agitation improved after cath placed. Lorazepam started.  1/22 minimally responsive/lethargic. PCCM asked to see re: additional recs for treating his delirium.    PAST MEDICAL HISTORY :   has a past medical history of Cancer ([place]) (throat /1998); Cataract; COPD (chronic obstructive pulmonary disease) ([place]); Glaucoma; Hyperlipidemia; Hypertension; Memory loss; Personal history of adenomatous  colonic polyps (03/22/2012); Shortness of breath; Stroke ([place]); Wears dentures; Wears glasses; and Wears hearing aid.  has a past surgical history that includes Knee surgery (1990); Colonoscopy; vocal cord surg (1992); Tonsillectomy; Eye surgery (Bilateral); Shoulder arthroscopy with rotator cuff repair and subacromial decompression (Right, 08/30/2012); Cardiac catheterization (N/A, 04/10/2016); Cardiac catheterization (Bilateral, 04/10/2016); Appendectomy;  Carotid endarterectomy (Right, 05/08/2014); and Mesenteric artery bypass (N/A, 06/01/2016). Prior to Admission medications   Medication Sig Start Date End Date Taking? Authorizing Provider  Artificial Tear Solution (SYSTANE CONTACTS) SOLN Place 1 drop into both eyes 3 (three) times daily.    Yes Historical Provider, MD  aspirin EC 81 MG tablet Take 81 mg by mouth daily.   Yes Historical Provider, MD  atorvastatin (LIPITOR) 40 MG tablet Take 40 mg by mouth daily. 04/24/16  Yes Historical Provider, MD  brimonidine (ALPHAGAN P) 0.1 % SOLN Place 1 drop into both eyes every 8 (eight) hours.    Yes Historical Provider, MD  donepezil (ARICEPT) 10 MG tablet Take 10 mg by mouth every morning.   Yes Historical Provider, MD  latanoprost (XALATAN) 0.005 % ophthalmic solution Place 1 drop into the left eye at bedtime. 02/27/16  Yes Historical Provider, MD  lisinopril (PRINIVIL,ZESTRIL) 10 MG tablet Take 10 mg by mouth daily.   Yes Historical Provider, MD  Multiple Vitamins-Minerals (CENTRUM SILVER 50+MEN PO) Take 1 tablet by mouth daily.   Yes Historical Provider, MD  Multiple Vitamins-Minerals (OCUVITE ADULT 50+ PO) Take 1 tablet by mouth daily.    Yes Historical Provider, MD  Omega-3 Fatty Acids (FISH OIL) 1200 MG CAPS Take 1,200 mg by mouth daily.    Yes Historical Provider, MD  sildenafil (VIAGRA) 25 MG tablet Take 25 mg by mouth daily as needed for erectile dysfunction.   Yes Historical Provider, MD   Allergies  Allergen Reactions  . No Known Allergies     FAMILY HISTORY:  family history includes Heart Problems in his mother. SOCIAL HISTORY:  reports that he quit smoking about 19 years ago. His smoking use included Cigarettes. He has never used smokeless  tobacco. He reports that he does not drink alcohol or use drugs.  REVIEW OF SYSTEMS:   Unable due to delirium   SUBJECTIVE:  Not in distress, pleasantly confused  VITAL SIGNS: Temp:  [97.9 F (36.6 C)-99.3 F (37.4 C)] 98 F (36.7 C) (01/22  1100) Pulse Rate:  [25-91] 85 (01/22 1400) Resp:  [15-29] 25 (01/22 1400) BP: (118-189)/(71-119) 189/105 (01/22 1400) SpO2:  [88 %-100 %] 99 % (01/22 1400)  PHYSICAL EXAMINATION: General:  Elderly 76 year old white male. Up in chair. No distress.  Neuro:  Easily awakens. Moves all extremities. Oriented only X 1. Confused. [place]  HEENT:  NCAT. MMM. No JVD  Cardiovascular:  RRR, no MRG  Lungs:  Crackles faint in both bases. Equal chest rise. No accessory use  Abdomen:  Soft, not tender. Incision unremarkable  Musculoskeletal:  Equal st and bulk  Skin:  Warm and dry    Recent Labs Lab 06/06/16 0222 06/07/16 0227 06/08/16 0255  NA 140 140 138  K 3.4* 3.1* 3.9  CL 106 106 105  CO2 28 27 26   BUN 25* 22* 17  CREATININE 0.91 0.92 0.96  GLUCOSE 114* 105* 105*    Recent Labs Lab 06/06/16 0222 06/07/16 0227 06/08/16 0255  HGB 11.1* 10.8* 10.7*  HCT 33.2* 32.1* 31.9*  WBC 7.9 7.2 7.4  PLT 155 186 193   No results found.  ASSESSMENT / PLAN:  Acute delirium superimposed on underlying early dementia. Currently hypo-active.  -no doubt exacerbated by: sleep disturbance, medications (both benzos and narcotics), and general change on ADLs. Suspect that the hypoactive component has been exacerbated by Benzos.  Plan/rec Dc benzos Cont PRN haldol 2.5 mg as needed IF agitated tonight we can add risperdol  Q hs (call [facility] at night if needed)  Continue to regulate day/night time routine Dc morphine and haldol, will see if we can just use tylenol for now.  Added back Aricept.  Agree w/ checking UA/culture   S/p supraceliac aorta to celiac and superior mesenteric artery bypass.  -pain tolerated.  -tolerating PO intake Plan Continue routine post-op care.   Anemia (mix of ABL and chronic) Plan  Trend CBC Transfuse per ICU protocol   Probable copd.  Smoked for 40 years. Stopped 10 years ago. Not on therapy. No PFTs.  Plan Wean O2 Cont pulm hygiene     Urinary retention Plan Cont foley for now  Summary Asked to see for delirium. Now over-sedated. Suspect this is all exacerbated by polypharm and sleep disturbance as well as being out of his home routine. All superimposed on underlying early dementia. I think the hypoactive element is likely d/t the ativan and morphine -dc ativan and morphine -cont low dose haldol PRN-->if agitated tonight we can add HS Risperdal  -agree w/ UA/UC -continue to encourage sleep/wake cycle.    Erick Colace ACNP-BC [place] Pager # [phone] OR # [phone] if no answer   06/08/2016, 2:53 PM

## 2016-06-08 NOTE — Progress Notes (Signed)
Occupational Therapy Treatment Patient Details Name: Keith Gibson MRN: [id number] DOB: December 17, 1940 Today's Date: 06/08/2016    History of present illness 76 yo admitted with mesenteric ischemia s/p aortosuperior mesenteric artery BPG. PMHx: Right CEA, COPD, HLD, HTN, glaucoma   OT comments  Pt seen as cotreat with PT. Pt ambulated with +2 Max A @ 80 ft using  Technique with pt's arms around therapist's shoulders to facilitate upright posture and stepping. Eyes closed majority of session. ? pt's sleep/wake cycle. Wife states pt was a "night owl". VSS during session. Will continue to follow. As pt becomes more alert, feel he will be appropriate for CIR.    Follow Up Recommendations  CIR;Supervision/Assistance - 24 hour    Equipment Recommendations  3 in 1 bedside commode;Tub/shower bench    Recommendations for Other Services Rehab consult    Precautions / Restrictions Precautions Precautions: Fall Restrictions Weight Bearing Restrictions: No       Mobility Bed Mobility Overal bed mobility: Needs Assistance Bed Mobility: Supine to Sit Rolling: Max assist;+2 for physical assistance         General bed mobility comments: Pt initiated moving legs to EOB and elevating trunk. R lat lean  Transfers Overall transfer level: Needs assistance Equipment used: 2 person hand held assist Transfers: Sit to/from Stand Sit to Stand: +2 physical assistance;Max assist         General transfer comment: used technique of placing pt's arms around therapist's shoulders to increase upright posture. Pt kyphotic and increased posterior lean with just +2 HHA    Balance Overall balance assessment: Needs assistance   Sitting balance-Leahy Scale: Poor       Standing balance-Leahy Scale: Zero                     ADL                                       Functional mobility during ADLs: +2 for physical assistance;Maximal assistance General ADL Comments: total A at  this time      Vision                     Perception     Praxis      Cognition   Behavior During Therapy: Restless;Flat affect Overall Cognitive Status: Impaired/Different from baseline Area of Impairment: Orientation;Attention;Memory;Following commands;Safety/judgement;Awareness;Problem solving Orientation Level: Disoriented to;Place;Time;Situation Current Attention Level: Focused Memory: Decreased short-term memory  Following Commands: Follows one step commands inconsistently Safety/Judgement: Decreased awareness of safety;Decreased awareness of deficits Awareness: Intellectual Problem Solving: Slow processing;Decreased initiation;Difficulty sequencing;Requires verbal cues;Requires tactile cues      Extremity/Trunk Assessment               Exercises Other Exercises Other Exercises: BUE A/AAROM within functional limits   Shoulder Instructions       General Comments      Pertinent Vitals/ Pain       Pain Assessment: Faces Faces Pain Scale: No hurt  Home Living                                          Prior Functioning/Environment              Frequency  Min 3X/week        Progress  Toward Goals  OT Goals(current goals can now be found in the care plan section)  Progress towards OT goals: Progressing toward goals  Acute Rehab OT Goals Patient Stated Goal: family - for pt to get better OT Goal Formulation: With patient/family Time For Goal Achievement: 06/16/16 Potential to Achieve Goals: Good ADL Goals Pt Will Perform Grooming: with set-up;with supervision;sitting Pt Will Perform Upper Body Bathing: with set-up;with supervision;sitting Pt Will Transfer to Toilet: with supervision;bedside commode;ambulating Pt Will Perform Toileting - Clothing Manipulation and hygiene: with supervision;sit to/from stand Additional ADL Goal #1: Pt will demonstrate emergent awareness during ADL task in nondistracting envrionment  Plan  Discharge plan remains appropriate;Frequency needs to be updated    Co-evaluation    PT/OT/SLP Co-Evaluation/Treatment: Yes Reason for Co-Treatment: Complexity of the patient's impairments (multi-system involvement);Necessary to address cognition/behavior during functional activity;For patient/therapist safety;To address functional/ADL transfers   OT goals addressed during session: ADL's and self-care;Strengthening/ROM      End of Session Equipment Utilized During Treatment: Gait belt;Oxygen (3L)   Activity Tolerance Patient limited by lethargy   Patient Left in chair;with call bell/phone within reach;with family/visitor present (nsg aware restraints were left off with family present)   Nurse Communication Mobility status        Time: [id number] OT Time Calculation (min): 44 min  Charges: OT General Charges $OT Visit: 1 Procedure OT Treatments $Therapeutic Activity: 23-37 mins  Vonnie Ligman,HILLARY 06/08/2016, 11:22 AM   Maurie Boettcher, OT/L  [phone] 06/08/2016

## 2016-06-08 NOTE — Progress Notes (Signed)
Continuing to follow pt's course.  Per  PCCM notes, pt. minimally responsive/lethargic earlier today. Medications are being adjusted.  I will continue to follow pt's  course for a potential CIR admission when more medically stable and able to participate, and pending insurance approval and bed availability.  Awaiting improvement in condition and participation before initiating insurance authorization.  Please call if questions.  [facility] Admissions Coordinator Cell [phone] Office [phone]

## 2016-06-08 NOTE — Progress Notes (Addendum)
Nutrition Follow Up  DOCUMENTATION CODES:   Not applicable  INTERVENTION:    Ensure Enlive po BID, each supplement provides 350 kcal and 20 grams of protein  NUTRITION DIAGNOSIS:   Inadequate oral intake now related to lethargy/confusion as evidenced by per patient/family report, ongoing  GOAL:   Patient will meet greater than or equal to 90% of their needs, currently unmet  MONITOR:   PO intake, Supplement acceptance, Labs, Weight trends, I & O's  ASSESSMENT:   76 yo with PMH of R CEA, COPD, HLD, HTN, glaucoma; admitted with mesenteric ischemia s/p aortosuperior mesenteric artery BPG.  Pt has eyes closed; lethargic. Lunch meal tray is untouched on tray table. Family member feeding pt applesauce upon RD visit. Wife reports pt was eating well PTA; has had a 2 year hx of slow wt loss of 20 lbs. Will order Ensure Enlive supplements; family agreeable.  Nutrition focused physical exam completed.  No muscle or subcutaneous fat depletion noticed.  Diet Order:  Diet Heart Room service appropriate? Yes; Fluid consistency: Thin  Skin:  Reviewed, no issues  Last BM:  1/21  Height:   Ht Readings from Last 1 Encounters:  06/01/16 5\' 7"  (1.702 m)    Weight:   Wt Readings from Last 1 Encounters:  06/01/16 151 lb (68.5 kg)   Wt Readings from Last 20 Encounters:  06/01/16 151 lb (68.5 kg)  05/27/16 151 lb 12.8 oz (68.9 kg)  05/07/16 150 lb (68 kg)  05/06/16 153 lb (69.4 kg)  05/01/16 153 lb (69.4 kg)  04/10/16 152 lb (68.9 kg)  03/26/16 151 lb (68.5 kg)  02/13/16 150 lb (68 kg)  01/29/16 150 lb 0.4 oz (68.1 kg)  08/30/12 172 lb 2 oz (78.1 kg)  03/22/12 171 lb (77.6 kg)  03/08/12 171 lb (77.6 kg)   Ideal Body Weight:  67.2 kg  BMI:  Body mass index is 23.65 kg/m.  Estimated Nutritional Needs:   Kcal:  1700-1900  Protein:  80-90 gm  Fluid:  1.7-1.9 L  EDUCATION NEEDS:   No education needs identified at this time  Arthur Holms, RD, LDN Pager #:  [phone] After-Hours Pager #: [phone]

## 2016-06-08 NOTE — Progress Notes (Addendum)
[facility]  Subjective  - Not very responding much this am.   Objective 132/74 65 98 F (36.7 C) (Axillary) 19 100%  Intake/Output Summary (Last 24 hours) at 06/08/16 0851 Last data filed at 06/08/16 0600  Gross per 24 hour  Intake             1720 ml  Output             1225 ml  Net              495 ml    Abdomin soft, well healing incision Feet warm, tries to move all 4 ext. Restraints still in place  Assessment/Planning:  s/p: Supraceliac aorta to celiac and superior mesenteric artery bypass  4 Days Post-Op  Tolerating PO's needs to be feed and encouraged Leukocytosis improved K+ repleted Wait until he is more alert before D/C foley continue haldol/ativan  Laurence Slate [facility] 06/08/2016 8:51 AM --  More lethargic today.  Probably oversedated at this point. No real evidence of other problems. Will have critical care see him to see if they have other thoughts on treating his delirium Will check UA and urine culture. Leave foley for now as had urinary retention over weekend Family updated  [name], MD [facility] [place]: [phone] Pager: [phone]  Laboratory Lab Results:  Recent Labs  06/07/16 0227 06/08/16 0255  WBC 7.2 7.4  HGB 10.8* 10.7*  HCT 32.1* 31.9*  PLT 186 193   BMET  Recent Labs  06/07/16 0227 06/08/16 0255  NA 140 138  K 3.1* 3.9  CL 106 105  CO2 27 26  GLUCOSE 105* 105*  BUN 22* 17  CREATININE 0.92 0.96  CALCIUM 8.1* 8.2*    COAG Lab Results  Component Value Date   INR 1.33 06/01/2016   INR 0.96 05/27/2016   No results found for: PTT

## 2016-06-08 NOTE — Progress Notes (Signed)
Physical Therapy Treatment Patient Details Name: Keith Gibson MRN: [id number] DOB: [birth year] Today's Date: 06/08/2016    History of Present Illness 76 yo admitted with mesenteric ischemia s/p aortosuperior mesenteric artery BPG. PMHx: Right CEA, COPD, HLD, HTN, glaucoma    PT Comments    Pt remains lethargic with minimal eye opening. Did verbally respond to a few questions. Pt tolerated ambulation with maxAx2 but eyes remained closed. Acute PT to con't to follow.  Follow Up Recommendations  CIR;Supervision/Assistance - 24 hour     Equipment Recommendations  Rolling walker with 5" wheels    Recommendations for Other Services       Precautions / Restrictions Precautions Precautions: Fall Precaution Comments: lethargic Restrictions Weight Bearing Restrictions: No    Mobility  Bed Mobility Overal bed mobility: Needs Assistance Bed Mobility: Supine to Sit Rolling: Max assist;+2 for physical assistance   Supine to sit: Max assist;+2 for safety/equipment;+2 for physical assistance;HOB elevated     General bed mobility comments: pt initiated LE movement however required maxAx2 to scoot hips to EOB  Transfers Overall transfer level: Needs assistance Equipment used: 2 person hand held assist Transfers: Sit to/from Stand Sit to Stand: +2 physical assistance;Max assist         General transfer comment: used technique of placing pt's arms around therapist's shoulders to increase upright posture. Pt kyphotic and increased posterior lean with just +2 HHA  Ambulation/Gait Ambulation/Gait assistance: Max assist;+2 physical assistance Ambulation Distance (Feet): 60 Feet Assistive device:  (2 person "3 mousketeer" technqiue) Gait Pattern/deviations: Step-through pattern;Decreased stride length;Narrow base of support Gait velocity: slow Gait velocity interpretation: <1.8 ft/sec, indicative of risk for recurrent falls General Gait Details: pt maintained eyes closed with brief  eye opening with max v/c's. SPO2>97% pt with sequential stepping but required maxA at posterior hips to provide tactile cues for anterior momentum   Stairs            Wheelchair Mobility    Modified Rankin (Stroke Patients Only)       Balance Overall balance assessment: Needs assistance Sitting-balance support: Feet supported;Single extremity supported Sitting balance-Leahy Scale: Poor Sitting balance - Comments: maxA to have pt reach with L UE to hold onto foot of the bed Postural control: Right lateral lean   Standing balance-Leahy Scale: Zero                      Cognition Arousal/Alertness: Lethargic Behavior During Therapy: Restless;Flat affect Overall Cognitive Status: Impaired/Different from baseline Area of Impairment: Orientation;Attention;Memory;Following commands;Safety/judgement;Awareness;Problem solving Orientation Level: Disoriented to;Place;Time;Situation Current Attention Level: Focused Memory: Decreased short-term memory Following Commands: Follows one step commands inconsistently Safety/Judgement: Decreased awareness of safety;Decreased awareness of deficits Awareness: Intellectual Problem Solving: Slow processing;Decreased initiation;Difficulty sequencing;Requires verbal cues;Requires tactile cues      Exercises Other Exercises Other Exercises: BUE A/AAROM within functional limits    General Comments        Pertinent Vitals/Pain Pain Assessment: Faces Faces Pain Scale: No hurt    Home Living                      Prior Function            PT Goals (current goals can now be found in the care plan section) Acute Rehab PT Goals Patient Stated Goal: family - for pt to get better Progress towards PT goals: Progressing toward goals    Frequency    Min 3X/week      PT Plan  Current plan remains appropriate    Co-evaluation PT/OT/SLP Co-Evaluation/Treatment: Yes Reason for Co-Treatment: Complexity of the patient's  impairments (multi-system involvement) PT goals addressed during session: Mobility/safety with mobility OT goals addressed during session: ADL's and self-care;Strengthening/ROM     End of Session Equipment Utilized During Treatment: Gait belt;Oxygen Activity Tolerance: Patient tolerated treatment well Patient left: in chair;with call bell/phone within reach;with family/visitor present;with chair alarm set     Time: [id number] PT Time Calculation (min) (ACUTE ONLY): 22 min  Charges:  $Gait Training: 8-22 mins                    G Codes:      Koleen Distance Ferrell Flam June 21, 2016, 11:44 AM   Kittie Plater, PT, DPT Pager #: [phone] Office #: [phone]

## 2016-06-08 NOTE — Progress Notes (Signed)
ANTIBIOTIC CONSULT NOTE - INITIAL  Pharmacy Consult for Zosyn Indication: sepsis  Allergies  Allergen Reactions  . No Known Allergies     Patient Measurements: Height: 5\' 7"  (170.2 cm) Weight: 151 lb (68.5 kg) IBW/kg (Calculated) : 66.1 Adjusted Body Weight:    Vital Signs: Temp: 97.8 F (36.6 C) (01/22 1500) Temp Source: Oral (01/22 1500) BP: 189/105 (01/22 1400) Pulse Rate: 85 (01/22 1400) Intake/Output from previous day: 01/21 0701 - 01/22 0700 In: [id number] [P.O.:720; I.V.:1050] Out: 1225 [Urine:1225] Intake/Output from this shift: Total I/O In: 400 [I.V.:400] Out: 450 [Urine:450]  Labs:  Recent Labs  06/06/16 0222 06/07/16 0227 06/08/16 0255  WBC 7.9 7.2 7.4  HGB 11.1* 10.8* 10.7*  PLT 155 186 193  CREATININE 0.91 0.92 0.96   Estimated Creatinine Clearance: 61.2 mL/min (by C-G formula based on SCr of 0.96 mg/dL). No results for input(s): VANCOTROUGH, VANCOPEAK, VANCORANDOM, GENTTROUGH, GENTPEAK, GENTRANDOM, TOBRATROUGH, TOBRAPEAK, TOBRARND, AMIKACINPEAK, AMIKACINTROU, AMIKACIN in the last 72 hours.   Microbiology: Recent Results (from the past 720 hour(s))  Surgical pcr screen     Status: None   Collection Time: 05/27/16 10:33 AM  Result Value Ref Range Status   MRSA, PCR NEGATIVE NEGATIVE Final   Staphylococcus aureus NEGATIVE NEGATIVE Final    Comment:        The Xpert SA Assay (FDA approved for NASAL specimens in patients over 42 years of age), is one component of a comprehensive surveillance program.  Test performance has been validated by [facility] for patients greater than or equal to 70 year old. It is not intended to diagnose infection nor to guide or monitor treatment.     Medical History: Past Medical History:  Diagnosis Date  . 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 Cataract    bil removed  . COPD (chronic obstructive pulmonary disease) ([place])   . Glaucoma   . Hyperlipidemia   . Hypertension   . Memory loss   .  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 Shortness of breath   . Stroke ([place])    2015  . Wears dentures    full top-partial bottom  . Wears glasses   . Wears hearing aid    both ears    Medications:  Prescriptions Prior to Admission  Medication Sig Dispense Refill Last Dose  . Artificial Tear Solution (SYSTANE CONTACTS) SOLN Place 1 drop into both eyes 3 (three) times daily.    06/01/2016 at [place]  . aspirin EC 81 MG tablet Take 81 mg by mouth daily.   Past Week at Unknown time  . atorvastatin (LIPITOR) 40 MG tablet Take 40 mg by mouth daily.   05/31/2016 at Unknown time  . brimonidine (ALPHAGAN P) 0.1 % SOLN Place 1 drop into both eyes every 8 (eight) hours.    06/01/2016 at [place]  . donepezil (ARICEPT) 10 MG tablet Take 10 mg by mouth every morning.   05/31/2016 at Unknown time  . latanoprost (XALATAN) 0.005 % ophthalmic solution Place 1 drop into the left eye at bedtime.   05/31/2016 at Unknown time  . lisinopril (PRINIVIL,ZESTRIL) 10 MG tablet Take 10 mg by mouth daily.   06/01/2016 at Unknown time  . Multiple Vitamins-Minerals (CENTRUM SILVER 50+MEN PO) Take 1 tablet by mouth daily.   Past Week at Unknown time  . Multiple Vitamins-Minerals (OCUVITE  ADULT 50+ PO) Take 1 tablet by mouth daily.    Past Week at Unknown time  . Omega-3 Fatty Acids (FISH OIL) 1200 MG CAPS Take 1,200 mg by mouth daily.    Past Week at Unknown time  . sildenafil (VIAGRA) 25 MG tablet Take 25 mg by mouth daily as needed for erectile dysfunction.   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 Post-op complications included confusion and delirium, urinary retention. Start Zosyn for sepsis/UTI. Estimated CrCl 60-65. WBC 7.4, Afebrile.   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 Zosyn 3.375g IV q8hrs.  Keith Gibson, PharmD, BCPS Clinical Staff Pharmacist Pager [phone]  Keith Gibson 06/08/2016,5:26 PM

## 2016-06-09 DIAGNOSIS — G9340 Encephalopathy, unspecified: Secondary | ICD-10-CM

## 2016-06-09 DIAGNOSIS — F0390 Unspecified dementia without behavioral disturbance: Secondary | ICD-10-CM

## 2016-06-09 MED ORDER — ASPIRIN EC 81 MG PO TBEC
81.0000 mg | DELAYED_RELEASE_TABLET | Freq: Every day | ORAL | Status: DC
  Administered 2016-06-09 – 2016-06-15 (×7): 81 mg via ORAL
  Filled 2016-06-09 (×7): qty 1

## 2016-06-09 MED ORDER — ATORVASTATIN CALCIUM 40 MG PO TABS
40.0000 mg | ORAL_TABLET | Freq: Every day | ORAL | Status: DC
  Administered 2016-06-09 – 2016-06-15 (×7): 40 mg via ORAL
  Filled 2016-06-09 (×7): qty 1

## 2016-06-09 MED ORDER — OMEGA-3-ACID ETHYL ESTERS 1 G PO CAPS
1.0000 g | ORAL_CAPSULE | Freq: Every day | ORAL | Status: DC
  Administered 2016-06-09 – 2016-06-15 (×6): 1 g via ORAL
  Filled 2016-06-09 (×7): qty 1

## 2016-06-09 MED ORDER — LISINOPRIL 10 MG PO TABS
10.0000 mg | ORAL_TABLET | Freq: Every day | ORAL | Status: DC
  Administered 2016-06-09 – 2016-06-10 (×2): 10 mg via ORAL
  Filled 2016-06-09 (×2): qty 1

## 2016-06-09 NOTE — Progress Notes (Signed)
06/09/2016 1445 Received pt to room 2W13 from 2S.  Pt is A&O, no c/o voiced.  Tele monitor applied and CCMD notified.  Oriented pt to room, call light and call bell.  Call bell in reach, family at bedside. [place]

## 2016-06-09 NOTE — Progress Notes (Signed)
I have initiated the insurance authorization process for a potential CIR admission.  I have updated the patient and his wife that admission to CIR is pending insurance approval, medical readiness and bed availability.  We are currently experiencing limited bed availability for the next day or so.  [place] Admissions Coordinator Cell [phone] Office [phone]

## 2016-06-09 NOTE — Progress Notes (Signed)
[facility]  Subjective  - a little less confused   Objective 106/62 63 97.4 F (36.3 C) (Oral) 17 99%  Intake/Output Summary (Last 24 hours) at 06/09/16 0736 Last data filed at 06/09/16 0600  Gross per 24 hour  Intake             1370 ml  Output             2575 ml  Net            -1205 ml   Abdomen soft nontender Alert and oriented person place situation not time, knows me  Assessment/Planning: Post op delirium waxes and wanes UTI will d/c foley again today hopefully no further retention Appreciate critical care recs  [name] 06/09/2016 7:36 AM --  Laboratory Lab Results:  Recent Labs  06/07/16 0227 06/08/16 0255  WBC 7.2 7.4  HGB 10.8* 10.7*  HCT 32.1* 31.9*  PLT 186 193   BMET  Recent Labs  06/07/16 0227 06/08/16 0255  NA 140 138  K 3.1* 3.9  CL 106 105  CO2 27 26  GLUCOSE 105* 105*  BUN 22* 17  CREATININE 0.92 0.96  CALCIUM 8.1* 8.2*    COAG Lab Results  Component Value Date   INR 1.33 06/01/2016   INR 0.96 05/27/2016   No results found for: PTT

## 2016-06-09 NOTE — Progress Notes (Signed)
Occupational Therapy Treatment Patient Details Name: Keith Gibson MRN: [id number] DOB: [birth year] Today's Date: 06/09/2016    History of present illness 76 yo admitted with mesenteric ischemia s/p aortosuperior mesenteric artery BPG. PMHx: Right CEA, COPD, HLD, HTN, glaucoma   OT comments  Excellent session today. Used "Keith Gibson" with +2 Min A to ambulate @ 175ft with 1 rest break. VSS. Stood at sinnk to complete oral care. Pt able to sequence and complete structured tasks, but required mod vc to complete unstructured tasks. VSS throughout. Pt joking with therapist and interactive. Pt is excellent CIR candidate and is appropriate when medically stable. . If pt becomes more confused in pm, recommend use of sitter. Will continue to follow acutely to facilitate DC to next venue of care.   Follow Up Recommendations  CIR;Supervision/Assistance - 24 hour    Equipment Recommendations  3 in 1 bedside commode;Tub/shower bench    Recommendations for Other Services Rehab consult    Precautions / Restrictions Precautions Precautions: Fall Restrictions Weight Bearing Restrictions: No       Mobility Bed Mobility Overal bed mobility: Needs Assistance Bed Mobility: Supine to Sit     Supine to sit: Min assist;HOB elevated        Transfers Overall transfer level: Needs assistance Equipment used:  Keith Gibson) Transfers: Sit to/from Stand Sit to Stand: +2 physical assistance;Min assist         General transfer comment: vcv for hand placement. physical A to release hands from grips initially    Balance     Sitting balance-Leahy Scale: Fair       Standing balance-Leahy Scale: Poor                     ADL Overall ADL's : Needs assistance/impaired Eating/Feeding: Set up;Supervision/ safety   Grooming: Minimal assistance;Oral care;Wash/dry hands;Wash/dry face Grooming Details (indicate cue type and reason): S with familiar tasks. mIn A with unstructured tasks      Lower Body Bathing: Moderate assistance;Sit to/from stand           Toilet Transfer: Minimal assistance;Stand-pivot           Functional mobility during ADLs: Minimal assistance;+2 for physical assistance;Rolling walker ("eva walker") General ADL Comments: Able to complete oral care with set up. When given spray for pericare, pt with difficulty sequencing and completing tasks      Vision                     Perception     Praxis      Cognition   Behavior During Therapy: [facility] for tasks assessed/performed Overall Cognitive Status: Impaired/Different from baseline Area of Impairment: Orientation;Attention;Memory;Following commands;Safety/judgement;Awareness;Problem solving Orientation Level: Disoriented to;Time (I'm still where I've been for the last week. "Keith Gibson") Current Attention Level: Sustained Memory: Decreased short-term memory  Following Commands: Follows one step commands consistently Safety/Judgement: Decreased awareness of deficits;Decreased awareness of safety Awareness: Intellectual Problem Solving: Slow processing;Difficulty sequencing;Requires verbal cues      Extremity/Trunk Assessment               Exercises     Shoulder Instructions       General Comments      Pertinent Vitals/ Pain       Pain Assessment: Faces Faces Pain Scale: Hurts a little bit Pain Location: abdomen Pain Descriptors / Indicators: Discomfort Pain Intervention(s): Limited activity within patient's tolerance  Home Living  Prior Functioning/Environment              Frequency  Min 3X/week        Progress Toward Goals  OT Goals(current goals can now be found in the care plan section)  Progress towards OT goals: Progressing toward goals  Acute Rehab OT Goals Patient Stated Goal: family - for pt to get better OT Goal Formulation: With patient/family Time For Goal Achievement:  06/16/16 Potential to Achieve Goals: Good ADL Goals Pt Will Perform Grooming: with set-up;with supervision;sitting Pt Will Perform Upper Body Bathing: with set-up;with supervision;sitting Pt Will Transfer to Toilet: with supervision;bedside commode;ambulating Pt Will Perform Toileting - Clothing Manipulation and hygiene: with supervision;sit to/from stand Additional ADL Goal #1: Pt will demonstrate emergent awareness during ADL task in nondistracting envrionment  Plan Discharge plan remains appropriate    Co-evaluation                 End of Session Equipment Utilized During Treatment: Gait belt;Rolling walker;Oxygen (2L)   Activity Tolerance Patient tolerated treatment well   Patient Left in chair;with call bell/phone within reach;with chair alarm set;with nursing/sitter in room;with family/visitor present   Nurse Communication Mobility status        Time: 1015-1055 OT Time Calculation (min): 40 min  Charges: OT General Charges $OT Visit: 1 Procedure OT Treatments $Self Care/Home Management : 38-52 mins  Keith Gibson,Keith Gibson 06/09/2016, 11:13 AM   Keith Gibson, OT/L  [phone] 06/09/2016

## 2016-06-09 NOTE — Progress Notes (Signed)
Spoke with Dr. [place] Alar about Foley removal if patient is unable to void. Per MD, if pt cannot void, Nursing staff is to replace Foley for retention. Foley removed and 1230. Pt DTV. Will continue to monitor pt closely.

## 2016-06-09 NOTE — Progress Notes (Signed)
Name: Keith Gibson MRN: [id number] DOB: 04-30-1941    ADMISSION DATE:  06/01/2016 CONSULTATION DATE: 06/08/2016  REFERRING MD :  [place] Alar   CHIEF COMPLAINT:  Delirium   BRIEF PATIENT DESCRIPTION: 76 y.o. male w/ h/o early dementia. Underwent planned supraceliac aorta to celiac and superior mesenteric bypass on 1/15. PCCM asked to see re: additional recs for treating delirium on 1/22.  SUBJECTIVE: No acute events overnight. Patient more awake and alert this morning. Denies any nausea or emesis. Continues to have mild abdominal pain. Did have a mild headache last night but no vision changes.  REVIEW OF SYSTEMS:  No chest pain or pressure. No dyspnea or cough. No subjective fever or chills.  VITAL SIGNS: Temp:  [97.4 F (36.3 C)-99.3 F (37.4 C)] 98.6 F (37 C) (01/23 0746) Pulse Rate:  [63-86] 63 (01/23 0600) Resp:  [13-29] 17 (01/23 0600) BP: (106-189)/(62-134) 106/62 (01/23 0600) SpO2:  [95 %-100 %] 99 % (01/23 0600)  PHYSICAL EXAMINATION: General:  Awake. Alert. No acute distress. Watching TV.  Integument:  Warm & dry. No rash on exposed skin.  Extremities:  No cyanosis or clubbing.  HEENT:  Moist mucus membranes. No oral ulcers. No scleral injection or icterus.  Cardiovascular:  Regular rate. No edema. No appreciable JVD.  Pulmonary:  Normal work of breathing on nasal cannula oxygen. Speaking in complete sentences. Overall clear with auscultation. Abdomen: Soft. Hypoactive bowel sounds. Nondistended.  Neurological: Cranial nerves grossly intact. No meningismus. Oriented to year, president, place, and person.  LABS:  CBC Latest Ref Rng & Units 06/08/2016 06/07/2016 06/06/2016  WBC 4.0 - 10.5 K/uL 7.4 7.2 7.9  Hemoglobin 13.0 - 17.0 g/dL 10.7(L) 10.8(L) 11.1(L)  Hematocrit 39.0 - 52.0 % 31.9(L) 32.1(L) 33.2(L)  Platelets 150 - 400 K/uL 193 186 155   BMP Latest Ref Rng & Units 06/08/2016 06/07/2016 06/06/2016  Glucose 65 - 99 mg/dL 105(H) 105(H) 114(H)  BUN 6 - 20 mg/dL 17  22(H) 25(H)  Creatinine 0.61 - 1.24 mg/dL 0.96 0.92 0.91  Sodium 135 - 145 mmol/L 138 140 140  Potassium 3.5 - 5.1 mmol/L 3.9 3.1(L) 3.4(L)  Chloride 101 - 111 mmol/L 105 106 106  CO2 22 - 32 mmol/L 26 27 28   Calcium 8.9 - 10.3 mg/dL 8.2(L) 8.1(L) 8.4(L)   MICROBIOLOGY: U/A 1/22:  TNTC WBC & Many Bacteria w/ 0-5 Squamous epithelial cells. Urine Ctx 1/22 >>  ANTIBIOTICS: Zosyn 1/22 >>  ASSESSMENT / PLAN:  76 y.o. male with altered mental status and history of early dementia. Status post mesenteric bypass on 06/01/16. Altered mental status is likely multifactorial from underlying dementia as well as urinary tract infection and probable delirium. Question thiamine deficiency given his history of chronic alcohol abuse up until 6 months ago. Patient's mental status is continuing to improve with his current treatment regimen of thiamine and antibiotics.  1. Acute Encephalopathy: Likely multifactorial from urinary tract infection and delirium. Improving. Limiting narcotic medications. Continuing to avoid benzodiazepines. 2. UTI: Continuing empiric Zosyn day #2 of 10-14 total. Awaiting finalization of urine culture. Recommend adjusting antibiotics to culture and sensitivities. Recommend at least a 10 day course of antibiotics and depending upon clinical status could go as long as 14 days. 3. Remote Chronic EtOH Use: Continuing high-dose IV thiamine 3 days. No role for benzodiazepines at this time. 4. COPD: No exacerbation at this time. Continuing as needed albuterol nebulizer treatments. 5. Dementia:  Continuing home Aricept. 6. Essential Hypertension: Management per primary service.  Remainder of care  as per primary service. PCCM will sign off. Please re-consult [place] if we can be of any further assistance.   I have spent a total of 36 minutes of time today caring for the patient, reviewing the patient's electronic medical record, and with more than 50% of that time spent coordinating care with the  patient's transfer as well as reviewing the continuing plan of care with the patient and family at bedside.  Sonia Baller Ashok Cordia, M.D. [facility] Pager:  [phone] After 3pm or if no response, call [phone] 8:21 AM 06/09/16

## 2016-06-09 NOTE — Progress Notes (Signed)
Pt transferred to 2W13 with belongings. Report given to receiving RN and all questions answered. Receiving RN at bedside. VSS during transfer.  Pt assisted to chair in new room. Family at bedside.

## 2016-06-10 LAB — BASIC METABOLIC PANEL
Anion gap: 11 (ref 5–15)
BUN: 15 mg/dL (ref 6–20)
CALCIUM: 9.1 mg/dL (ref 8.9–10.3)
CO2: 23 mmol/L (ref 22–32)
CREATININE: 1.34 mg/dL — AB (ref 0.61–1.24)
Chloride: 100 mmol/L — ABNORMAL LOW (ref 101–111)
GFR, EST AFRICAN AMERICAN: 58 mL/min — AB (ref >60)
GFR, EST NON AFRICAN AMERICAN: 50 mL/min — AB (ref >60)
Glucose, Bld: 110 mg/dL — ABNORMAL HIGH (ref 65–99)
Potassium: 4.7 mmol/L (ref 3.5–5.1)
SODIUM: 134 mmol/L — AB (ref 135–145)

## 2016-06-10 LAB — URINE CULTURE

## 2016-06-10 MED ORDER — CIPROFLOXACIN HCL 500 MG PO TABS
500.0000 mg | ORAL_TABLET | Freq: Two times a day (BID) | ORAL | Status: DC
  Administered 2016-06-10 (×2): 500 mg via ORAL
  Filled 2016-06-10 (×2): qty 1

## 2016-06-10 NOTE — Progress Notes (Signed)
I have received a denial from pt's [facility] Medicare for IP Rehab.  I have informed the patient and family as well as Marvetta Gibbons, RNCM (who notified CSW).  Family states their preference for SNF would be [facility] in [place].  I will sign off.    [place] Admissions Coordinator Cell [phone] Office [phone]

## 2016-06-10 NOTE — NC FL2 (Signed)
[place] LEVEL OF CARE SCREENING TOOL     IDENTIFICATION  Patient Name: Keith Gibson Birthdate: Jan 21, 1941 Sex: male Admission Date (Current Location): 06/01/2016  [facility] and [place] Number:  Herbalist and Address:  The . [facility], [place] [address] [place], [place] [address]      Provider Number: [id number]  Attending Physician Name and Address:  Elam Dutch, MD  Relative Name and Phone Number:       Current Level of Care: Hospital Recommended Level of Care: [place] Prior Approval Number:    Date Approved/Denied:   PASRR Number:   [id number]  Discharge Plan: SNF    Current Diagnoses: Patient Active Problem List   Diagnosis Date Noted  . Mesenteric ischemia ([place]) 06/01/2016  . Pre-op evaluation 05/01/2016  . Personal history of adenomatous  colonic polyps 03/22/2012    Orientation RESPIRATION BLADDER Height & Weight     Self, Place  Normal Continent Weight: 151 lb (68.5 kg) Height:  5\' 7"  (170.2 cm)  BEHAVIORAL SYMPTOMS/MOOD NEUROLOGICAL BOWEL NUTRITION STATUS      Incontinent Diet (see DC summary)  AMBULATORY STATUS COMMUNICATION OF NEEDS Skin   Extensive Assist Verbally Surgical wounds                       Personal Care Assistance Level of Assistance  Bathing, Feeding, Dressing Bathing Assistance: Limited assistance Feeding assistance: Limited assistance Dressing Assistance: Limited assistance     Functional Limitations Info  Sight, Hearing, Speech Sight Info: Adequate Hearing Info: Adequate Speech Info: Adequate    SPECIAL CARE FACTORS FREQUENCY  PT (By licensed PT), OT (By licensed OT)     PT Frequency: 5x OT Frequency: 5x            Contractures Contractures Info: Not present    Additional Factors Info  Code Status, Allergies Code Status Info: Full Code Allergies Info: NKA           Current Medications (06/10/2016):  This is the current hospital active  medication list Current Facility-Administered Medications  Medication Dose Route Frequency Provider Last Rate Last Dose  . 0.9 %  sodium chloride infusion  500 mL Intravenous Once PRN Hulen Shouts Rhyne, PA-C      . acetaminophen (TYLENOL) tablet 325-650 mg  325-650 mg Oral Q4H PRN Hulen Shouts Rhyne, PA-C   650 mg at 06/09/16 1829   Or  . acetaminophen (TYLENOL) suppository 325-650 mg  325-650 mg Rectal Q4H PRN Samantha J Rhyne, PA-C      . albuterol (PROVENTIL) (2.5 MG/3ML) 0.083% nebulizer solution 2.5 mg  2.5 mg Nebulization Q6H PRN Elam Dutch, MD   2.5 mg at 06/09/16 0342  . aspirin EC tablet 81 mg  81 mg Oral Daily Elam Dutch, MD   81 mg at 06/10/16 0931  . atorvastatin (LIPITOR) tablet 40 mg  40 mg Oral Daily Elam Dutch, MD   40 mg at 06/10/16 0932  . bisacodyl (DULCOLAX) suppository 10 mg  10 mg Rectal Daily PRN Samantha J Rhyne, PA-C      . brimonidine (ALPHAGAN) 0.15 % ophthalmic solution 1 drop  1 drop Both Eyes Q8H Samantha J Rhyne, PA-C   1 drop at 06/10/16 1300  . ciprofloxacin (CIPRO) tablet 500 mg  500 mg Oral BID Hulen Shouts Rhyne, PA-C   500 mg at 06/10/16 1259  . dextrose 5 %-0.45 % sodium chloride infusion   Intravenous Continuous  Hulen Shouts Rhyne, PA-C 50 mL/hr at 06/10/16 1259    . docusate sodium (COLACE) capsule 100 mg  100 mg Oral Daily Samantha J Rhyne, PA-C   100 mg at 06/10/16 0932  . donepezil (ARICEPT) tablet 10 mg  10 mg Oral QHS Erick Colace, NP   10 mg at 06/09/16 2144  . feeding supplement (ENSURE ENLIVE) (ENSURE ENLIVE) liquid 237 mL  237 mL Oral BID BM Elam Dutch, MD   237 mL at 06/10/16 1300  . guaiFENesin-dextromethorphan (ROBITUSSIN DM) 100-10 MG/5ML syrup 15 mL  15 mL Oral Q4H PRN Samantha J Rhyne, PA-C      . haloperidol lactate (HALDOL) injection 2.5 mg  2.5 mg Intravenous Q6H PRN Erick Colace, NP   2.5 mg at 06/10/16 0044  . latanoprost (XALATAN) 0.005 % ophthalmic solution 1 drop  1 drop Left Eye QHS Hulen Shouts Rhyne, PA-C   1  drop at 06/09/16 2145  . lisinopril (PRINIVIL,ZESTRIL) tablet 10 mg  10 mg Oral Daily Elam Dutch, MD   10 mg at 06/10/16 0932  . magnesium sulfate IVPB 4 g 100 mL  4 g Intravenous Once PRN Elam Dutch, MD      . metoprolol (LOPRESSOR) injection 2-5 mg  2-5 mg Intravenous Q2H PRN Samantha J Rhyne, PA-C      . omega-3 acid ethyl esters (LOVAZA) capsule 1 g  1 g Oral Daily Elam Dutch, MD   1 g at 06/10/16 0932  . ondansetron (ZOFRAN) injection 4 mg  4 mg Intravenous Q6H PRN Samantha J Rhyne, PA-C      . pantoprazole (PROTONIX) EC tablet 40 mg  40 mg Oral Daily Samantha J Rhyne, PA-C   40 mg at 06/10/16 0931  . phenol (CHLORASEPTIC) mouth spray 1 spray  1 spray Mouth/Throat PRN Samantha J Rhyne, PA-C      . polyvinyl alcohol (LIQUIFILM TEARS) 1.4 % ophthalmic solution 1 drop  1 drop Both Eyes TID Elam Dutch, MD   1 drop at 06/10/16 0932  . potassium chloride SA (K-DUR,KLOR-CON) CR tablet 20-40 mEq  20-40 mEq Oral Once PRN Samantha J Rhyne, PA-C      . potassium chloride SA (K-DUR,KLOR-CON) CR tablet 40 mEq  40 mEq Oral BID Elam Dutch, MD   40 mEq at 06/10/16 0932  . thiamine 500mg  in normal saline (74ml) IVPB  500 mg Intravenous Q8H Javier Glazier, MD   500 mg at 06/10/16 [id number]     Discharge Medications: Please see discharge summary for a list of discharge medications.  Relevant Imaging Results:  Relevant Lab Results:   Additional Information SSN:  [id number]  [place], [place]

## 2016-06-10 NOTE — Clinical Social Work Placement (Signed)
   CLINICAL SOCIAL WORK PLACEMENT  NOTE  Date:  06/10/2016  Patient Details  Name: Keith Gibson MRN: [id number] Date of Birth: 1941-05-06  Clinical Social Work is seeking post-discharge placement for this patient at the [place] level of care (*CSW will initial, date and re-position this form in  chart as items are completed):  Yes   Patient/family provided with [place] Work Department's list of facilities offering this level of care within the geographic area requested by the patient (or if unable, by the patient's family).  Yes   Patient/family informed of their freedom to choose among providers that offer the needed level of care, that participate in Medicare, Medicaid or managed care program needed by the patient, have an available bed and are willing to accept the patient.  Yes   Patient/family informed of [facility]'s ownership interest in [facility] and [facility], as well as of the fact that they are under no obligation to receive care at these facilities.  PASRR submitted to EDS on       PASRR number received on       Existing PASRR number confirmed on       FL2 transmitted to all facilities in geographic area requested by pt/family on 06/10/16     FL2 transmitted to all facilities within larger geographic area on       Patient informed that his/her managed care company has contracts with or will negotiate with certain facilities, including the following:            Patient/family informed of bed offers received.  Patient chooses bed at       Physician recommends and patient chooses bed at      Patient to be transferred to   on  .  Patient to be transferred to facility by       Patient family notified on   of transfer.  Name of family member notified:        PHYSICIAN Please sign FL2     Additional Comment:    _______________________________________________ [place], LCSW 06/10/2016, 2:13 PM

## 2016-06-10 NOTE — Care Management Important Message (Signed)
Important Message  Patient Details  Name: Keith Gibson MRN: [id number] Date of Birth: [birth year]   Medicare Important Message Given:  Yes    [name] May 06/10/2016, 1:35 PM

## 2016-06-10 NOTE — Progress Notes (Addendum)
  Progress Note    06/10/2016 8:09 AM 9 Days Post-Op  Subjective:  Pt sitting up in chair-looks good! Only c/o is lower back pain between his hips  Afebrile HR  70's-80's NSR [id number] systolic [id number] RA  Vitals:   06/09/16 2339 06/10/16 0536  BP: (!) 161/74 120/69  Pulse: 83 80  Resp:  17  Temp:  98.5 F (36.9 C)    Physical Exam: Cardiac:  regular Lungs:  CTAB Incisions:  Healing nicely Extremities:  + palpable PT pulses bilaterally Abdomen:  Soft, NT/ND +BM yesterday Neuro:  Alert to place, month, not year (2019)  CBC    Component Value Date/Time   WBC 7.4 06/08/2016 0255   RBC 3.52 (L) 06/08/2016 0255   HGB 10.7 (L) 06/08/2016 0255   HCT 31.9 (L) 06/08/2016 0255   PLT 193 06/08/2016 0255   MCV 90.6 06/08/2016 0255   MCH 30.4 06/08/2016 0255   MCHC 33.5 06/08/2016 0255   RDW 12.9 06/08/2016 0255    BMET    Component Value Date/Time   NA 134 (L) 06/10/2016 0336   K 4.7 06/10/2016 0336   CL 100 (L) 06/10/2016 0336   CO2 23 06/10/2016 0336   GLUCOSE 110 (H) 06/10/2016 0336   BUN 15 06/10/2016 0336   CREATININE 1.34 (H) 06/10/2016 0336   CALCIUM 9.1 06/10/2016 0336   GFRNONAA 50 (L) 06/10/2016 0336   GFRAA 58 (L) 06/10/2016 0336    INR    Component Value Date/Time   INR 1.33 06/01/2016 1306     Intake/Output Summary (Last 24 hours) at 06/10/16 0809 Last data filed at 06/10/16 0300  Gross per 24 hour  Intake              590 ml  Output             1215 ml  Net             -625 ml     Assessment:  76 y.o. 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 Oneida Alar -pt needs to mobilize -possibility CIR may be  able to take him for short term rehab   Leontine Locket, PA-C [facility] [phone] 06/10/2016 8:09 AM  Still some confusion.  Got haldol once at 1am last night Abdomen continues to heal  Not really eating much,. No abdominal pain Change antibiotic to cipro since resistant to zosyn Sq heparin Will get CTA abdomen in am to assess graft  [name], MD [facility] [place]: [phone] Pager: [phone]

## 2016-06-10 NOTE — Progress Notes (Signed)
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 PMHx: Right CEA, COPD, HLD, HTN, glaucoma    PT Comments    Pt with significant improvement both cognitively and functionally today. Pt remains to have decreased safety awareness, decreased insight to deficits, impaired balance and difficulty problem solving and sequencing. Acute PT con't to follow.   Follow Up Recommendations  CIR;Supervision/Assistance - 24 hour     Equipment Recommendations  None recommended by PT    Recommendations for Other Services       Precautions / Restrictions Precautions Precautions: Fall Precaution Comments: dementia Restrictions Weight Bearing Restrictions: No    Mobility  Bed Mobility               General bed mobility comments: pt up in chair  Transfers Overall transfer level: Needs assistance Equipment used: Rolling walker (2 wheeled) Transfers: Sit to/from Stand Sit to Stand: Min guard;Min assist         General transfer comment: v/c's for hand placement, max v/c's to stay on task, partly due to [facility] and demenita  Ambulation/Gait Ambulation/Gait assistance: Min assist Ambulation Distance (Feet): 150 Feet Assistive device: Rolling walker (2 wheeled);1 person hand held assist;None Gait Pattern/deviations: Step-through pattern;Narrow base of support Gait velocity: slow compared to baseline Gait velocity interpretation: Below normal speed for age/gender General Gait Details: pt started with RW, pt with poor walker management, then transitioned to HHAx1 then transitioned to minA without AD or HHA. Pt with swaying L/R requiring minA to amb in straight line   Stairs            Wheelchair Mobility    Modified Rankin (Stroke Patients Only)       Balance Overall balance assessment: Needs  assistance         Standing balance support: No upper extremity supported Standing balance-Leahy Scale: Fair                      Cognition Arousal/Alertness: Awake/alert Behavior During Therapy: WFL for tasks assessed/performed Overall Cognitive Status: Impaired/Different from baseline Area of Impairment: Orientation;Attention;Memory;Following commands;Safety/judgement;Awareness;Problem solving Orientation Level: Disoriented to;Time Current Attention Level: Sustained Memory: Decreased short-term memory Following Commands: Follows one step commands consistently Safety/Judgement: Decreased awareness of safety;Decreased awareness of deficits Awareness: Intellectual Problem Solving: Slow processing;Decreased initiation;Difficulty sequencing;Requires verbal cues;Requires tactile cues General Comments: aware pt has dementia at baseline but unclear of severity    Exercises      General Comments        Pertinent Vitals/Pain Pain Assessment: No/denies pain Pain Location: had onset of low back pain s/p ambulation Pain Descriptors / Indicators: Discomfort Pain Intervention(s): Monitored during session    Home Living                      Prior Function            PT Goals (current goals can now be found in the care plan section) Acute Rehab PT Goals Patient Stated Goal: home Progress towards PT goals: Progressing toward goals    Frequency    Min 3X/week      PT Plan Current plan remains appropriate    Co-evaluation             End of Session Equipment Utilized During Treatment: Gait belt Activity Tolerance: Patient tolerated treatment well Patient left:  in chair;with call bell/phone within reach;with family/visitor present     Time: [id number] PT Time Calculation (min) (ACUTE ONLY): 21 min  Charges:  $Gait Training: 8-22 mins                    G Codes:      Berline Lopes 01-Jul-2016, 2:40 PM   Kittie Plater, PT, DPT Pager #:  [phone] Office #: [phone]

## 2016-06-10 NOTE — Progress Notes (Signed)
Inpatient Rehabilitation  I await [facility] Medicare's decision and bed availability for a possible IP Rehab admission.  I have updated pt/wife and Marvetta Gibbons, RNCM.    [place] Admissions Coordinator Cell [phone] Office [phone]

## 2016-06-10 NOTE — Clinical Social Work Note (Signed)
Clinical Social Work Assessment  Patient Details  Name: Keith Gibson MRN: [id number] Date of Birth: 1941/04/10  Date of referral:  06/10/16               Reason for consult:  Facility Placement, [employer], Discharge Planning                Permission sought to share information with:  Case Freight forwarder, Customer service manager, Family Supports Permission granted to share information::  Yes, Verbal Permission Granted  Name::        Agency::     Relationship::  Wife Abeer Amorin and son Apostolos Biernat  Contact Information:     Housing/Transportation Living arrangements for the past 2 months:  Single Family Home Source of Information:  Patient Patient Interpreter Needed:  None Criminal Activity/Legal Involvement Pertinent to Current Situation/Hospitalization:  No - Comment as needed Significant Relationships:  Adult Children, Other Family Members, Spouse Lives with:  Self Do you feel safe going back to the place where you live?  No Need for family participation in patient care:  Yes (Comment)  Care giving concerns:  Patient is from home with his wife.  Patient has multiple family members including a son and daughter who are also involved. WIfe reports prior to surgery, patient was very independent with mobility and ADLs.  Reports he is currently not at his baseline and requiring SNF at [place].  Wife on board reporting she is unable to take care of him due to age and needs.   Social Worker assessment / plan:  LCSW received consult for SNF.  LCSW has completed assessment of needs and SNF work up.  Family would like [facility] or [facility] if possible. Referrals completed along with [facility] and [facility]ar. Will follow up with bed offers once available and continue to assist with discharge planning.  Employment status:  Retired Nurse, adult PT Recommendations:  [place] / Referral to community resources:  [place]  Patient/Family's Response to care:  Agreeable to plan  Patient/Family's Understanding of and Emotional Response to Diagnosis, Current Treatment, and Prognosis:  Family understanding of patient needs and limitations at home with safety. Agreeable to treatment plan.  Emotional Assessment Appearance:  Appears stated age Attitude/Demeanor/Rapport:    Affect (typically observed):  Accepting, Adaptable Orientation:  Oriented to Self, Oriented to Place, Oriented to Situation Alcohol / Substance use:  Not Applicable Psych involvement (Current and /or in the community):  No (Comment)  Discharge Needs  Concerns to be addressed:  No discharge needs identified Readmission within the last 30 days:  No Current discharge risk:  None Barriers to Discharge:  Continued Medical Work up   [place], LCSW 06/10/2016, 2:15 PM

## 2016-06-11 ENCOUNTER — Inpatient Hospital Stay (HOSPITAL_COMMUNITY): Payer: Medicare Other

## 2016-06-11 LAB — BASIC METABOLIC PANEL
ANION GAP: 8 (ref 5–15)
BUN: 15 mg/dL (ref 6–20)
CALCIUM: 8.7 mg/dL — AB (ref 8.9–10.3)
CO2: 24 mmol/L (ref 22–32)
CREATININE: 1.44 mg/dL — AB (ref 0.61–1.24)
Chloride: 102 mmol/L (ref 101–111)
GFR, EST AFRICAN AMERICAN: 53 mL/min — AB (ref >60)
GFR, EST NON AFRICAN AMERICAN: 46 mL/min — AB (ref >60)
Glucose, Bld: 118 mg/dL — ABNORMAL HIGH (ref 65–99)
Potassium: 4.5 mmol/L (ref 3.5–5.1)
Sodium: 134 mmol/L — ABNORMAL LOW (ref 135–145)

## 2016-06-11 MED ORDER — CIPROFLOXACIN HCL 500 MG PO TABS
250.0000 mg | ORAL_TABLET | Freq: Two times a day (BID) | ORAL | Status: DC
  Administered 2016-06-11 – 2016-06-15 (×9): 250 mg via ORAL
  Filled 2016-06-11 (×9): qty 1

## 2016-06-11 MED ORDER — SODIUM CHLORIDE 0.9 % IV SOLN
INTRAVENOUS | Status: DC
  Administered 2016-06-11 – 2016-06-12 (×3): via INTRAVENOUS

## 2016-06-11 MED ORDER — IOPAMIDOL (ISOVUE-370) INJECTION 76%
INTRAVENOUS | Status: AC
  Administered 2016-06-11: 100 mL via INTRAVENOUS
  Filled 2016-06-11: qty 100

## 2016-06-11 MED ORDER — HEPARIN SODIUM (PORCINE) 5000 UNIT/ML IJ SOLN
5000.0000 [IU] | Freq: Three times a day (TID) | INTRAMUSCULAR | Status: DC
  Administered 2016-06-11 – 2016-06-15 (×13): 5000 [IU] via SUBCUTANEOUS
  Filled 2016-06-11 (×12): qty 1

## 2016-06-11 NOTE — Progress Notes (Signed)
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 PMHx: Right CEA, COPD, HLD, HTN, glaucoma    PT Comments    Pt progressing with gait with and without RW and able to tolerate 350' easily as well as stair ambulation. Pt in good spirits and at baseline cognitive status per family. Pt would benefit from HHPT and 24hr supervision at home. Wife agreeable to pt returning home as long as his mental status remains at baseline. Pt educated for HEP and encouraged to ambulate with staff supervision.    Follow Up Recommendations  Home health PT;Supervision/Assistance - 24 hour     Equipment Recommendations  None recommended by PT    Recommendations for Other Services       Precautions / Restrictions Precautions Precautions: Fall Precaution Comments: dementia    Mobility  Bed Mobility               General bed mobility comments: pt up in chair on arrival  Transfers Overall transfer level: Needs assistance   Transfers: Sit to/from Stand Sit to Stand: Min guard         General transfer comment: cues for hand placement and safety x 2 trials  Ambulation/Gait Ambulation/Gait assistance: Min assist Ambulation Distance (Feet): 350 Feet Assistive device: Rolling walker (2 wheeled);None Gait Pattern/deviations: Step-through pattern;Decreased stride length;Trunk flexed   Gait velocity interpretation: at or above normal speed for age/gender General Gait Details: cues for posture and position in RW with pt able to walk 200' with RW with cues to step into RW. Pt walked 150' without RW or HHA with increased sway but no need for physical assist. Directional cues to return to room   Stairs Stairs: Yes   Stair Management: Alternating pattern;Forwards Number of Stairs: 4 General stair comments: hand held  assist for balance  Wheelchair Mobility    Modified Rankin (Stroke Patients Only)       Balance Overall balance assessment: Needs assistance   Sitting balance-Leahy Scale: Good       Standing balance-Leahy Scale: Fair                      Cognition Arousal/Alertness: Awake/alert Behavior During Therapy: WFL for tasks assessed/performed Overall Cognitive Status: History of cognitive impairments - at baseline     Current Attention Level: Selective     Safety/Judgement: Decreased awareness of safety          Exercises General Exercises - Lower Extremity Long Arc Quad: AROM;Both;Seated;15 reps Hip ABduction/ADduction: AROM;Both;Seated;15 reps Hip Flexion/Marching: AROM;Both;Seated;15 reps    General Comments        Pertinent Vitals/Pain Pain Assessment: No/denies pain    Home Living                      Prior Function            PT Goals (current goals can now be found in the care plan section) Progress towards PT goals: Progressing toward goals    Frequency           PT Plan Discharge plan needs to be updated    Co-evaluation             End of Session Equipment Utilized During Treatment: Gait belt Activity Tolerance: Patient tolerated treatment well Patient left: in chair;with call bell/phone within reach;with family/visitor  present;with nursing/sitter in room     Time: 1107-1132 PT Time Calculation (min) (ACUTE ONLY): 25 min  Charges:  $Gait Training: 8-22 mins $Therapeutic Exercise: 8-22 mins                    G Codes:      Almus Woodham B Denessa Cavan 16-Jun-2016, 12:10 PM Elwyn Reach, [place]

## 2016-06-11 NOTE — Progress Notes (Signed)
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 PMHx: Right CEA, COPD, HLD, HTN, glaucoma   OT comments  Family is comfortable taking pt home with their 24 hour supervision. Educated in tub equipment and to encourage pt's participation in ADL as is safe. Pt is progressing well.  Follow Up Recommendations  Home health OT;Supervision/Assistance - 24 hour    Equipment Recommendations   (rolling walker, family will get tub bench on their own)    Recommendations for Other Services      Precautions / Restrictions Precautions Precautions: Fall Precaution Comments: dementia       Mobility Bed Mobility               General bed mobility comments: pt up in chair on arrival  Transfers Overall transfer level: Needs assistance Equipment used: Rolling walker (2 wheeled) Transfers: Sit to/from Stand Sit to Stand: Min guard;Min assist         General transfer comment: cues for hand placement, min guard for safety    Balance Overall balance assessment: Needs assistance Sitting-balance support: Feet supported;Single extremity supported Sitting balance-Leahy Scale: Fair       Standing balance-Leahy Scale: Fair                     ADL Overall ADL's : Needs assistance/impaired Eating/Feeding: Set up;Supervision/ safety Eating/Feeding Details (indicate cue type and reason): wife assists with self feeding as pt tires Grooming: Wash/dry hands;Min guard;Standing           Upper Body Dressing : Minimal assistance;Sitting       Toilet Transfer: Min guard;Ambulation;Regular Toilet;RW   Toileting- Clothing Manipulation and Hygiene: Minimal assistance;Sit to/from stand Toileting - Clothing Manipulation Details (indicate cue type and reason): wife concerned about pt's incontinence  once he is home, asking if pt will have condom cath at home, recommended briefs and chux for bed   Tub/Shower Transfer Details (indicate cue type and reason): educated in various options for seated showering, family to order online Functional mobility during ADLs: Minimal assistance;Rolling walker        Vision                     Perception     Praxis      Cognition   Behavior During Therapy: WFL for tasks assessed/performed Overall Cognitive Status: History of cognitive impairments - at baseline     Current Attention Level: Selective Memory: Decreased short-term memory    Safety/Judgement: Decreased awareness of safety;Decreased awareness of deficits     General Comments: family will provide 24 hour care and hire a caregiver for nights    Extremity/Trunk Assessment               Exercises    Shoulder Instructions       General Comments      Pertinent Vitals/ Pain       Pain Assessment: No/denies pain  Home Living                                          Prior Functioning/Environment              Frequency  Min 3X/week        Progress  Toward Goals  OT Goals(current goals can now be found in the care plan section)  Progress towards OT goals: Progressing toward goals  Acute Rehab OT Goals Patient Stated Goal: home Time For Goal Achievement: 06/16/16 Potential to Achieve Goals: Good  Plan Discharge plan needs to be updated    Co-evaluation                 End of Session Equipment Utilized During Treatment: Gait belt;Rolling walker   Activity Tolerance Patient tolerated treatment well   Patient Left in chair;with call bell/phone within reach;with family/visitor present   Nurse Communication          Time: [id number] OT Time Calculation (min): 24 min  Charges: OT General Charges $OT Visit: 1 Procedure OT Treatments $Self Care/Home Management : 23-37 mins  Malka So 06/11/2016, 3:16  PM  [phone]

## 2016-06-11 NOTE — Progress Notes (Addendum)
  Progress Note    06/11/2016 7:35 AM 10 Days Post-Op  Subjective:  Awake; c/o lower back pain again this morning  Tm 99.3 [id number] systolic HR  [id number] NSR [id number] RA  Vitals:   06/10/16 2059 06/11/16 0544  BP: 124/71 136/60  Pulse: 74 72  Resp: 18 18  Temp: 98.7 F (37.1 C) 99.3 F (37.4 C)    Physical Exam: Cardiac:  regular Lungs:  Non labored Incisions:  Healing nicely Extremities:  Bilateral feet are warm/well perfused Abdomen:  Soft; NT/ND; +BM yesterday  CBC    Component Value Date/Time   WBC 7.4 06/08/2016 0255   RBC 3.52 (L) 06/08/2016 0255   HGB 10.7 (L) 06/08/2016 0255   HCT 31.9 (L) 06/08/2016 0255   PLT 193 06/08/2016 0255   MCV 90.6 06/08/2016 0255   MCH 30.4 06/08/2016 0255   MCHC 33.5 06/08/2016 0255   RDW 12.9 06/08/2016 0255    BMET    Component Value Date/Time   NA 134 (L) 06/11/2016 0333   K 4.5 06/11/2016 0333   CL 102 06/11/2016 0333   CO2 24 06/11/2016 0333   GLUCOSE 118 (H) 06/11/2016 0333   BUN 15 06/11/2016 0333   CREATININE 1.44 (H) 06/11/2016 0333   CALCIUM 8.7 (L) 06/11/2016 0333   GFRNONAA 46 (L) 06/11/2016 0333   GFRAA 53 (L) 06/11/2016 0333    INR    Component Value Date/Time   INR 1.33 06/01/2016 1306     Intake/Output Summary (Last 24 hours) at 06/11/16 0735 Last data filed at 06/11/16 0500  Gross per 24 hour  Intake             2100 ml  Output              850 ml  Net             1250 ml     Assessment:  76 y.o. 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  -creatinine increased again this morning from 1.34 to 1.44.  K+ is 4.5.  Pt's creatinine clearance is 63mL/min--will decrease Cipro to 250mg  bid.  Increase IVF to 100cc/hr.  Lisinopril discontinued.  -DVT prophylaxis:  SQ heparin to start this  am; pt has had SCD's. -CTA pending    Leontine Locket, PA-C [facility] [phone] 06/11/2016 7:35 AM  Mental status slowly improving Creatinine slightly elevated probably multifactorial.  Will stop ACE.  Increase IVF Po intake is poor. 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  Since BP control reasonable and kidney function no reason to address this currently Encourage PO intake Hopefully home in the next few days if renal function and mental status stabilizes.  [name], MD [facility] [place] Office: [phone] Pager: [phone]

## 2016-06-11 NOTE — Clinical Social Work Note (Signed)
Pt evaluated today by PT, SNF recommended. CSW spoke to spouse and pt's son about PT recommendation. The family would like [facility] only, declines SNF placement at this time. CSW notified RNCM.  Pt has no other SW needs at this time. CSW is signing off.  Brenlyn Beshara B. Joline Maxcy Clinical Social Work Dept Weekend Social Worker [phone] 1:16 PM

## 2016-06-12 LAB — BASIC METABOLIC PANEL
Anion gap: 6 (ref 5–15)
BUN: 11 mg/dL (ref 6–20)
CALCIUM: 8.8 mg/dL — AB (ref 8.9–10.3)
CO2: 24 mmol/L (ref 22–32)
Chloride: 104 mmol/L (ref 101–111)
Creatinine, Ser: 1.2 mg/dL (ref 0.61–1.24)
GFR calc Af Amer: >60 mL/min (ref >60)
GFR, EST NON AFRICAN AMERICAN: 57 mL/min — AB (ref >60)
Glucose, Bld: 125 mg/dL — ABNORMAL HIGH (ref 65–99)
POTASSIUM: 4.6 mmol/L (ref 3.5–5.1)
SODIUM: 134 mmol/L — AB (ref 135–145)

## 2016-06-12 MED ORDER — PSEUDOEPHEDRINE HCL ER 120 MG PO TB12
120.0000 mg | ORAL_TABLET | Freq: Every day | ORAL | Status: DC | PRN
  Administered 2016-06-12: 120 mg via ORAL
  Filled 2016-06-12 (×2): qty 1

## 2016-06-12 MED ORDER — MEGESTROL ACETATE 40 MG PO TABS
40.0000 mg | ORAL_TABLET | Freq: Two times a day (BID) | ORAL | Status: DC
Start: 2016-06-12 — End: 2016-06-15
  Administered 2016-06-12 – 2016-06-15 (×7): 40 mg via ORAL
  Filled 2016-06-12 (×7): qty 1

## 2016-06-12 NOTE — Progress Notes (Signed)
Patient walked approximately 500 feet with front-wheeled walker without difficulty.

## 2016-06-12 NOTE — Progress Notes (Addendum)
  Progress Note    06/12/2016 8:33 AM 11 Days Post-Op  Subjective:  Sitting in chair; just took his meds and says he feels like the potassium pill is stuck in this throat.  Eating apple sauce without difficulty.  Wife states he walked with PT yesterday-wife states that he did 3 or 4 stairs, walked with a walker to the nursing station and then around the station without a walker.   Afebrile HR Q000111Q NSR Q000111Q systolic Q000111Q RA  Vitals:   06/11/16 2038 06/12/16 0453  BP: (!) 156/75 (!) 153/77  Pulse: 80 77  Resp: 18 18  Temp: 98.8 F (37.1 C) 98.1 F (36.7 C)    Physical Exam: Cardiac:  regular Lungs:  Non labored Extremities:  Bilateral feet are warm and well perfused Abdomen:  Soft, NT/ND; +BS  CBC    Component Value Date/Time   WBC 7.4 06/08/2016 0255   RBC 3.52 (L) 06/08/2016 0255   HGB 10.7 (L) 06/08/2016 0255   HCT 31.9 (L) 06/08/2016 0255   PLT 193 06/08/2016 0255   MCV 90.6 06/08/2016 0255   MCH 30.4 06/08/2016 0255   MCHC 33.5 06/08/2016 0255   RDW 12.9 06/08/2016 0255    BMET    Component Value Date/Time   NA 134 (L) 06/11/2016 0333   K 4.5 06/11/2016 0333   CL 102 06/11/2016 0333   CO2 24 06/11/2016 0333   GLUCOSE 118 (H) 06/11/2016 0333   BUN 15 06/11/2016 0333   CREATININE 1.44 (H) 06/11/2016 0333   CALCIUM 8.7 (L) 06/11/2016 0333   GFRNONAA 46 (L) 06/11/2016 0333   GFRAA 53 (L) 06/11/2016 0333    INR    Component Value Date/Time   INR 1.33 06/01/2016 1306     Intake/Output Summary (Last 24 hours) at 06/12/16 [id number] Last data filed at 06/12/16 [id number]  Gross per 24 hour  Intake             2110 ml  Output             1450 ml  Net              660 ml     Assessment:  76 y.o. 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  PT/OT recommending HHPT/OT -labs drawn this am and are pending.  Hopefully renal function better.  Wife states kidney function has been decreased in the past.  Looking back, he had a creatinine of 1.4 back in October 2017 and 1.34 in 2014. -DVT prophylaxis:  SQ heparin   Leontine Locket, PA-C [facility] [phone] 06/12/2016 8:33 AM   Slow improvement mental status better still poor appetitie Sudafed for nasal congestion Megace Hopefully home early next week  [name], MD [facility] [place]: [phone] Pager: [phone]

## 2016-06-13 MED ORDER — HYDRALAZINE HCL 20 MG/ML IJ SOLN
10.0000 mg | Freq: Once | INTRAMUSCULAR | Status: AC
Start: 2016-06-13 — End: 2016-06-13
  Administered 2016-06-13: 10 mg via INTRAVENOUS
  Filled 2016-06-13: qty 1

## 2016-06-13 MED ORDER — POTASSIUM CHLORIDE 20 MEQ/15ML (10%) PO SOLN
40.0000 meq | Freq: Two times a day (BID) | ORAL | Status: DC
  Administered 2016-06-13 – 2016-06-14 (×3): 40 meq via ORAL
  Filled 2016-06-13 (×3): qty 30

## 2016-06-13 MED ORDER — SODIUM CHLORIDE 0.9 % IV SOLN
INTRAVENOUS | Status: DC
  Administered 2016-06-13: 05:00:00 via INTRAVENOUS

## 2016-06-13 NOTE — Progress Notes (Addendum)
[facility] [place]  Subjective  -  Doing well walking more, more alert.  Not able to recognize he has to go to the bathroom.  Still needs condom cath.     Objective (!) 148/71 83 97.7 F (36.5 C) (Oral) 18 95%  Intake/Output Summary (Last 24 hours) at 06/13/16 0758 Last data filed at 06/13/16 [id number]  Gross per 24 hour  Intake          2873.33 ml  Output             3350 ml  Net          -476.67 ml    Palpable DP B Abdomin soft, tolerating PO's still not much appitite Abdominal incision C/D/I  Assessment/Planning: 76 y.o. 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    Laurence [facility] 06/13/2016 7:58 AM --  Laboratory Lab Results: No results for input(s): WBC, HGB, HCT, PLT in the last 72 hours. BMET  Recent Labs  06/11/16 0333 06/12/16 0812  NA 134* 134*  K 4.5 4.6  CL 102 104  CO2 24 24  GLUCOSE 118* 125*  BUN 15 11  CREATININE 1.44* 1.20  CALCIUM 8.7* 8.8*    COAG Lab Results  Component Value Date   INR 1.33 06/01/2016   INR 0.96 05/27/2016   No results found for: PTT  I have independently interviewed patient and agree with PA assessment and plan above. More lucid today than previous days per family. Also eating more with megace added yesterday. Will check labs with prealbumin tomorrow.   Brandon C. Donzetta Matters, MD [facility] [place] Office: [phone] Pager: [phone]

## 2016-06-13 NOTE — Progress Notes (Signed)
Pt's blood pressure elevated this pm, manual reading of 180/90. Dr Donzetta Matters notified, verbal orders given and carried out. Will continue to monitor.  Jaymes Graff, RN

## 2016-06-14 LAB — BASIC METABOLIC PANEL
Anion gap: 8 (ref 5–15)
BUN: 7 mg/dL (ref 6–20)
CALCIUM: 9.1 mg/dL (ref 8.9–10.3)
CHLORIDE: 103 mmol/L (ref 101–111)
CO2: 22 mmol/L (ref 22–32)
CREATININE: 1.18 mg/dL (ref 0.61–1.24)
GFR calc non Af Amer: 58 mL/min — ABNORMAL LOW (ref >60)
Glucose, Bld: 94 mg/dL (ref 65–99)
Potassium: 4.6 mmol/L (ref 3.5–5.1)
SODIUM: 133 mmol/L — AB (ref 135–145)

## 2016-06-14 LAB — CBC
HCT: 36.6 % — ABNORMAL LOW (ref 39.0–52.0)
HEMOGLOBIN: 12.2 g/dL — AB (ref 13.0–17.0)
MCH: 29.8 pg (ref 26.0–34.0)
MCHC: 33.3 g/dL (ref 30.0–36.0)
MCV: 89.3 fL (ref 78.0–100.0)
PLATELETS: 309 10*3/uL (ref 150–400)
RBC: 4.1 MIL/uL — ABNORMAL LOW (ref 4.22–5.81)
RDW: 13.1 % (ref 11.5–15.5)
WBC: 7 10*3/uL (ref 4.0–10.5)

## 2016-06-14 LAB — PREALBUMIN: PREALBUMIN: 16.2 mg/dL — AB (ref 18–38)

## 2016-06-14 NOTE — Progress Notes (Addendum)
[facility] [place], he was up all night very restless.   Objective (!) 150/78 84 97.7 F (36.5 C) (Oral) 18 96%  Intake/Output Summary (Last 24 hours) at 06/14/16 0746 Last data filed at 06/14/16 [id number]  Gross per 24 hour  Intake              530 ml  Output             1400 ml  Net             -870 ml    Abdomin soft, incision healing well PO intake minimal  Assessment/Planning: 75 y.o.maleis s/p:  Supraceliac aorta to celiac and superior mesenteric artery bypass  13 Days Post-Op  Cr stable 1.8  Prealbumin low 16.2 encouraging PO intake trial of protein supplement minimal intake Trial of independent voiding today D/C condom foley Will discuss nutrition  further with Dr. Marquis Buggy, [facility] 06/14/2016 7:46 AM --  Laboratory Lab Results:  Recent Labs  06/14/16 0312  WBC 7.0  HGB 12.2*  HCT 36.6*  PLT 309   BMET  Recent Labs  06/12/16 0812 06/14/16 0312  NA 134* 133*  K 4.6 4.6  CL 104 103  CO2 24 22  GLUCOSE 125* 94  BUN 11 7  CREATININE 1.20 1.18  CALCIUM 8.8* 9.1    COAG Lab Results  Component Value Date   INR 1.33 06/01/2016   INR 0.96 05/27/2016   No results found for: PTT   I have independently interviewed patient and agree with PA assessment and plan above. Fully alert and oriented on my exam today. Encouraged po intake and out of bed all day.   Ziad Maye C. Donzetta Matters, MD [facility] [place] Office: [phone] Pager: [phone]

## 2016-06-15 ENCOUNTER — Other Ambulatory Visit: Payer: Self-pay

## 2016-06-15 LAB — C DIFFICILE QUICK SCREEN W PCR REFLEX
C DIFFICILE (CDIFF) TOXIN: NEGATIVE
C DIFFICLE (CDIFF) ANTIGEN: NEGATIVE
C Diff interpretation: NOT DETECTED

## 2016-06-15 MED ORDER — MEGESTROL ACETATE 400 MG/10ML PO SUSP
400.0000 mg | Freq: Two times a day (BID) | ORAL | Status: DC
  Filled 2016-06-15: qty 10

## 2016-06-15 MED ORDER — LISINOPRIL 10 MG PO TABS: 10.0000 mg | ORAL_TABLET | Freq: Every day | ORAL | Status: DC

## 2016-06-15 MED ORDER — CIPROFLOXACIN HCL 250 MG PO TABS: 250.0000 mg | ORAL_TABLET | Freq: Two times a day (BID) | ORAL | 0 refills | Status: DC

## 2016-06-15 MED ORDER — MEGESTROL ACETATE 400 MG/10ML PO SUSP: 400.0000 mg | Freq: Two times a day (BID) | ORAL | 3 refills | Status: DC

## 2016-06-15 NOTE — Care Management Note (Addendum)
Case Management Note Marvetta Gibbons RN, BSN Unit 2W-Case Manager [phone]  Patient Details  Name: Keith Gibson MRN: [id number] Date of Birth: [birth year]  Subjective/Objective:  Pt admitted with mesenteric ischemia- s/p bypass                Action/Plan: PTA pt lived at home with wife, PT/OT evals recommendation for rehab- CIR consulted- insurance denied- CSW consulted for STSNF, family now plans to return home with [facility]   Expected Discharge Date:     06/15/16             Expected Discharge Plan:  [place]  In-House Referral:  Clinical Social Work  Discharge planning Services  CM Consult  Post Acute Care Choice:  Home Health Choice offered to:  Patient, Spouse  DME Arranged:  N/A DME Agency:  NA  HH Arranged:  PT, OT, Nurse's Aide, RN Brent Agency:  [facility] (formerly [employer])  Status of Service:  Completed, signed off  If discussed at [employer] of Stay Meetings, dates discussed:  1/23, 1/25  Discharge Disposition: Home with home health   Additional Comments:  06/15/16- [place] RN, CM- pt has made great progress- PT/OT now recommending home with [facility]- spoke with pt and wife at bedside regarding d/c needs- per conversation plan is to have someone assist at first 24/7 - family plans to pay out of pocket for this- private duty list provided per request to assist family in finding help that they want. Also discussed DME- pt already has RW and bath chair- no other DME needs noted. Choice offered for [facility] agencies in [place] for [facility] services- per wife they have used [place] in the past (now known as [facility])- would like to use them again. Referral called to [facility] with [place] Go- who accepted referral-  Await HH orders - pt would benefit from HHRN/PT/OT/aide-  Notified Mary with [facility]- pt will go home today- orders have been placed- start of services planned for 1/30 with [facility].   Update: 1/29- 1245-  received call from Wylene Simmer with [facility]- family has contacted them to provide private duty nursing when pt returns home- per Hilda Blades they can have someone in the home this afternoon/evening if pt is discharged today per family request. This would be in addition to [facility] services being provided by [facility]- and would be out of pocket covered by family for private duty.   Dawayne Patricia, RN 06/15/2016, 12:14 PM

## 2016-06-15 NOTE — Progress Notes (Signed)
Physical Therapy Treatment Patient Details Name: Keith Gibson MRN: [id number] DOB: [birth year] Today's Date: 06/15/2016    History of Present Illness 76 yo admitted with mesenteric ischemia s/p aortosuperior mesenteric artery BPG. PMHx: Right CEA, COPD, HLD, HTN, glaucoma    PT Comments    Pt with flat affect and decreased energy level. Pt hasn't been eating much. Pt tolerated ambulation without AD but had 2 episodes of LOB. Acute PT to follow to progress indep with mobility.  Follow Up Recommendations  Home health PT;Supervision/Assistance - 24 hour     Equipment Recommendations  None recommended by PT    Recommendations for Other Services       Precautions / Restrictions Precautions Precautions: Fall Precaution Comments: dementia, poor intake Restrictions Weight Bearing Restrictions: No    Mobility  Bed Mobility               General bed mobility comments: pt up in chair  Transfers Overall transfer level: Needs assistance Equipment used: None Transfers: Sit to/from Stand Sit to Stand: Min guard         General transfer comment: increased time  Ambulation/Gait Ambulation/Gait assistance: Min assist Ambulation Distance (Feet): 350 Feet Assistive device: None Gait Pattern/deviations: Step-through pattern;Decreased stride length;Narrow base of support Gait velocity: decraesed Gait velocity interpretation: Below normal speed for age/gender General Gait Details: pt with 2 episodes of instability in which he attempted to compensate via cross over gait pattern and reaching for wall, minA to maintain balance   Stairs Stairs: Yes   Stair Management: One rail Right;Alternating pattern;Forwards Number of Stairs: 1    Wheelchair Mobility    Modified Rankin (Stroke Patients Only)       Balance           Standing balance support: No upper extremity supported;During functional activity Standing balance-Leahy Scale: Fair Standing balance comment: pt  stood at sink to brush dentures and wash out mouth with min guard. no instability                    Cognition Arousal/Alertness: Awake/alert Behavior During Therapy: WFL for tasks assessed/performed Overall Cognitive Status: History of cognitive impairments - at baseline (has dementia) Area of Impairment: Problem solving;Safety/judgement;Following commands   Current Attention Level: Selective Memory: Decreased short-term memory Following Commands: Follows one step commands consistently;Follows multi-step commands with increased time Safety/Judgement: Decreased awareness of safety;Decreased awareness of deficits Awareness: Emergent Problem Solving: Slow processing;Decreased initiation;Difficulty sequencing;Requires verbal cues;Requires tactile cues      Exercises      General Comments        Pertinent Vitals/Pain Pain Assessment: No/denies pain    Home Living                      Prior Function            PT Goals (current goals can now be found in the care plan section) Acute Rehab PT Goals Patient Stated Goal: home Progress towards PT goals: Progressing toward goals    Frequency    Min 3X/week      PT Plan      Co-evaluation             End of Session Equipment Utilized During Treatment: Gait belt Activity Tolerance: Patient tolerated treatment well Patient left: in chair;with call bell/phone within reach;with family/visitor present;with nursing/sitter in room     Time: [id number] PT Time Calculation (min) (ACUTE ONLY): 19 min  Charges:  $Gait Training: 8-22  mins                    G Codes:      Berline Lopes 06-28-16, 9:11 AM   Kittie Plater, PT, DPT Pager #: [phone] Office #: [phone]

## 2016-06-15 NOTE — Discharge Summary (Signed)
[facility] Discharge Summary  Keith Gibson Nov 30, 1940 76 y.o. male  [id number]  Admission Date: 06/01/2016  Discharge Date: 06/15/2016  Physician: Elam Dutch, MD  Admission Diagnosis: Chronic mesenteric ischemia K55.1  HPI:   This is a 76 y.o. male with a two-year history of slow weight loss of 20 pounds. The patient denies any changes in his diet or exercise pattern. He has had some changes in his bowel movement habits. He states he does have some loose stools occasionally andhis bowel movements are fairly unpredictable. He denies any postprandial abdominal pain. He states the only time he gets abdominal pain is with spicy foods. He does describe a bandlike or full sensation across his upper portion of his abdomen but does not really know what relates to this symptom. He is a former smoker but quit smoking in 1998. He is on aspirin and a statin. He denies any family history of abdominal aortic aneurysm. He did have a stroke 2 years ago and had a right carotid endarterectomy in [place]. His carotids are currently followed by a doctor at [facility]. He also has a history of erectile dysfunction. Upper endoscopy one month ago showed gastritis and a gastric polyp. Pathology confirmed this. Colonoscopy showed 2 erosions in the cecum which were biopsied. Colon biopsy showed active colitis. Otherwise normal exam. Other medical problems include COPD, hyperlipidemia, hypertension all of which are currently stable.  Hospital Course:  The patient was admitted to the hospital and taken to the operating room on 06/01/2016 and underwent: Supraceliac aorta to celiac and superior mesenteric artery bypass. He was extubated in the OR.     The patient tolerated the procedure well and was transported to the PACU in stable condition.   POD 1: Had an uneventful night. Had easily palpable pedal pulses bilaterally. Alert to year and president. Neuro exam intact. Good UOP. Pancreatic  enzymes and LFTs essentially normal. His NG was kept in and kept NPO. He was transferred out of the ICU.   POD 2: Was agitated and confused. Was following commands. Has some baseline mild dementia according wife. His abdomen was soft. ABG was checked and CXR was checked. He had small bilateral effusions and atelectasis. ABG was ok. Haldol was ordered for prn agitation. His NG was kept in given no gut function. He had some leukocytosis that was felt to be reactive from surgery.   POD 3: He was less confused. NG output was minimal, but kept in due to no gut function and high risk for aspiration.   POD 4: Was passing flatus. Had some nausea. NG discontinued. Still agitated and confused. Leukocytosis was resolving. Later that morning, he became more agitated and required up to 7L 02. He was given one injection of haldol and transferred to ICU for closer monitoring given post-op delirium/confustion. He was kept NPO.   POD 5: Still confused but more alert. Required haldol every 6 hours over night. Head CT was negative. Leukocytosis resolved. Started on clear liquid diet. Foley placed given urinary retention.   POD 6: Delirium somewhat improved. He knew he was in the hospital and why he had surgery but did not recognize wife and son. Diet advanced to heart healthy.   POD 7: Was more lethargic. Was kept in ICU. UA and urine culture checked. CCM consulted regarding delirium. Was felt to be over-sedation and sleep disturbance and change to normal home routine. Ativan and morphine were discontinued and continued on low dose prn haldol. Was started on  zosyn for UTI.   POD 8: Post-op delirium waxed and waned. Alert and oriented to person, place, situation, but not time. Foley was discontinued due to UTI. Transferred out of ICU.   POD 9: Still had some confusion. Antibiotics changed to ciprofloxacin. CTA ordered to assess graft.   POD 10: Creatinine was slightly elevated. His ACEI was held. CTA showed graft was  widely patent with known renal artery stenosis. BP control was reasonable therefore no reason to address renal artery stenosis.   POD 11: Mental status continued to improve. Had poor appetite and was started on megace.   POD 12-14: His mental status continued to improve. It was felt that he would improve at home. He still had a poor appetite. It was felt that he would do better at home as well. He had some loose stools and a c.diff was checked. His renal function was trending down back to baseline. He was ambulating in the halls with assistance.  Home health was arranged and private nurse also arranged for discharge. He was discharged home on POD 14 in fair condition.   CBC    Component Value Date/Time   WBC 7.0 06/14/2016 0312   RBC 4.10 (L) 06/14/2016 0312   HGB 12.2 (L) 06/14/2016 0312   HCT 36.6 (L) 06/14/2016 0312   PLT 309 06/14/2016 0312   MCV 89.3 06/14/2016 0312   MCH 29.8 06/14/2016 0312   MCHC 33.3 06/14/2016 0312   RDW 13.1 06/14/2016 0312    BMET    Component Value Date/Time   NA 133 (L) 06/14/2016 0312   K 4.6 06/14/2016 0312   CL 103 06/14/2016 0312   CO2 22 06/14/2016 0312   GLUCOSE 94 06/14/2016 0312   BUN 7 06/14/2016 0312   CREATININE 1.18 06/14/2016 0312   CALCIUM 9.1 06/14/2016 0312   GFRNONAA 58 (L) 06/14/2016 0312   GFRAA >60 06/14/2016 0312     Discharge Instructions:   The patient is discharged to home with extensive instructions on wound care and progressive ambulation. They are instructed not to drive or perform any heavy lifting until returning to see the physician in his office.  Discharge Instructions    Call MD for:  redness, tenderness, or signs of infection (pain, swelling, bleeding, redness, odor or green/yellow discharge around incision site)    Complete by:  As directed    Call MD for:  severe or increased pain, loss or decreased feeling  in affected limb(s)    Complete by:  As directed    Call MD for:  temperature >100.5    Complete by:   As directed    Discharge wound care:    Complete by:  As directed    Shower daily and wash abdominal wound daily with soap and water and pat dry. Do not peel off the skin glue. It will peel off on its own.   Increase activity slowly    Complete by:  As directed    Walk with assistance use walker or cane as needed   Lifting restrictions    Complete by:  As directed    No lifting greater than gallon of milk for 4 weeks   Resume previous diet    Complete by:  As directed       Discharge Diagnosis:  Chronic mesenteric ischemia K55.1  Secondary Diagnosis: Patient Active Problem List   Diagnosis Date Noted  . Mesenteric ischemia ([place]) 06/01/2016  . Pre-op evaluation 05/01/2016  . Personal history of adenomatous  colonic polyps 03/22/2012   Past Medical History:  Diagnosis Date  . Cancer ([place]) throat /1998   precancerous polyps  . Cataract    bil removed  . COPD (chronic obstructive pulmonary disease) ([place])   . Glaucoma   . Hyperlipidemia   . Hypertension   . Memory loss   . 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 Shortness of breath   . Stroke ([place])    2015  . Wears dentures    full top-partial bottom  . Wears glasses   . Wears hearing aid    both ears     Allergies as of 06/15/2016      Reactions   No Known Allergies       Medication List    STOP taking these medications   lisinopril 10 MG tablet Commonly known as:  PRINIVIL,ZESTRIL     TAKE these medications   aspirin EC 81 MG tablet Take 81 mg by mouth daily.   atorvastatin 40 MG tablet Commonly known as:  LIPITOR Take 40 mg by mouth daily.   brimonidine 0.1 % Soln Commonly known as:  ALPHAGAN P Place 1 drop into both eyes every 8 (eight) hours.   ciprofloxacin 250 MG tablet Commonly known as:  CIPRO Take 1 tablet (250 mg total) by mouth 2 (two) times daily.     donepezil 10 MG tablet Commonly known as:  ARICEPT Take 10 mg by mouth every morning.   Fish Oil 1200 MG Caps Take 1,200 mg by mouth daily.   latanoprost 0.005 % ophthalmic solution Commonly known as:  XALATAN Place 1 drop into the left eye at bedtime.   megestrol 400 MG/10ML suspension Commonly known as:  MEGACE Take 10 mLs (400 mg total) by mouth 2 (two) times daily.   CENTRUM SILVER 50+MEN PO Take 1 tablet by mouth daily.   OCUVITE ADULT 50+ PO Take 1 tablet by mouth daily.   sildenafil 25 MG tablet Commonly known as:  VIAGRA Take 25 mg by mouth daily as needed for erectile dysfunction.   SYSTANE CONTACTS Soln Place 1 drop into both eyes 3 (three) times daily.       Disposition: home  Patient's condition: is Good  Follow up: 1. Dr. Oneida Alar in 2 weeks   Virgina Jock, PA-C [facility] [phone] 06/15/2016  3:43 PM   - For VQI Registry use ---   Post-op:  Time to Extubation: [x ] In OR, [ ]  < 12 hrs, [ ]  12-24 hrs, [ ]  >=24 hrs Vasopressors Req. Post-op: No ICU Stay: 6 days Transfusion: No  MI: No, [ ]  Troponin only, [ ]  EKG or Clinical New Arrhythmia: No  Complications: CHF: No Resp failure: No, [ ]  Pneumonia, [ ]  Ventilator Chg in renal function: Yes, [x ] Inc. Cr > 0.5, [ ]  Temp. Dialysis, [ ]  Permanent dialysis Leg ischemia: No, no Surgery needed, [ ]  Yes, Surgery needed, [ ]  Amputation Bowel ischemia: No, [ ]  Medical Rx, [ ]  Surgical Rx Wound complication: No, [ ]  Superficial separation/infection, [ ]  Return to OR Return to OR: No  Return to OR for bleeding: No Stroke: No, [ ]  Minor, [ ]  Major  Discharge medications: Statin use:  Yes If No: [ ]  For Medical reasons, [ ]  Non-compliant ASA use:  Yes  If No: [ ]  For Medical reasons, [ ]   Non-compliant Plavix use:  No If No: [ ]  For Medical reasons, [ ]  Non-compliant Beta blocker use:  No If No: [ ]  For Medical reasons, [ ]  Non-compliant

## 2016-06-15 NOTE — Consult Note (Signed)
            Keith Gibson CM Primary Care Navigator  06/15/2016  AARIK BELLOFATTO 04-24-1941 [id number]    Went to see patient in the room to identify possible discharge needsbut nursing staff is currently working with patient- who was needing to use the bathroom.    Will try another time to meet with patient when available.  For additional questions please contact:  Edwena Felty A. Romone Shaff, BSN, RN-BC [facility] PRIMARY CARE Navigator Cell: [phone]

## 2016-06-15 NOTE — Evaluation (Signed)
Clinical/Bedside Swallow Evaluation Patient Details  Name: Keith Gibson MRN: [id number] Date of Birth: Oct 06, 1940  Today's Date: 06/15/2016 Time: SLP Start Time (ACUTE ONLY): 1035 SLP Stop Time (ACUTE ONLY): 1055 SLP Time Calculation (min) (ACUTE ONLY): 20 min  Past Medical History:  Past Medical History:  Diagnosis Date  . Cancer ([place]) throat /1998   precancerous polyps  . Cataract    bil removed  . COPD (chronic obstructive pulmonary disease) ([place])   . Glaucoma   . Hyperlipidemia   . Hypertension   . Memory loss   . 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 Shortness of breath   . Stroke ([place])    2015  . Wears dentures    full top-partial bottom  . Wears glasses   . Wears hearing aid    both ears   Past Surgical History:  Past Surgical History:  Procedure Laterality Date  . APPENDECTOMY     patient denies  . CAROTID ENDARTERECTOMY Right 05/08/2014  . COLONOSCOPY    . EYE SURGERY Bilateral     laser, cataract surgery  . KNEE SURGERY  1990   arthroscopic on right knee  . MESENTERIC ARTERY BYPASS N/A 06/01/2016   Procedure: AORTO SUPERIOR MESENTERIC AND CELIAC ARTERY BYPASS;  Surgeon: Elam Dutch, MD;  Location: [facility];  Service: Vascular;  Laterality: N/A;  . PERIPHERAL VASCULAR CATHETERIZATION N/A 04/10/2016   Procedure: Abdominal Aortogram;  Surgeon: Elam Dutch, MD;  Location: [place] CV LAB;  Service: Cardiovascular;  Laterality: N/A;  . PERIPHERAL VASCULAR CATHETERIZATION Bilateral 04/10/2016   Procedure: Lower Extremity Angiography;  Surgeon: Elam Dutch, MD;  Location: [place] CV LAB;  Service: Cardiovascular;  Laterality: Bilateral;  . SHOULDER ARTHROSCOPY WITH ROTATOR CUFF REPAIR AND SUBACROMIAL DECOMPRESSION Right 08/30/2012   Procedure: RIGHT SHOULDER ARTHROSCOPY WITH SUBACROMIAL DECOMPRESSION, DISTAL CLAVICLE  RESECTION AND ROTATOR CUFF REPAIR;  Surgeon: Cammie Sickle., MD;  Location: [facility] [facility];  Service: Orthopedics;  Laterality: Right;  . TONSILLECTOMY    . vocal cord surg  1992   removed precancerous polyps   HPI:  76 year old male admitted 06/01/16 with mesenteric ischemia, s/p aortosuperior mesenteric artery BPG. 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 Head CT - mild atrophy.   Assessment / Plan / Recommendation Clinical Impression  Pt presents with adequate oral motor strength and function, CN exam unremarkable. No overt s/s aspiration observed or reported on any consistency. Oral prep and propulsion of liquids and solids is timely, with timely swallow reflex and good laryngeal elevation on palpation. Pt does report new onset of difficulty swallowing multiple pills, however, nursing is providing meds one at a time in puree, which is working well per pt. Pt reports no difficulty swallowing, only poor appetite and therefore decreased po intake. Recommend supplements to facilitate adequate nutrition and hydration if reduced intake becomes problematic. No further ST intervention recommended at this time. Please reconsult if needs arise.     Aspiration Risk  Mild aspiration risk    Diet Recommendation Regular;Thin liquid   Liquid Administration via: Cup;Straw Medication Administration: Whole meds with puree Supervision: Patient able to self feed;Intermittent supervision to cue for compensatory strategies Compensations: Small sips/bites;Slow rate;Minimize environmental distractions Postural Changes: Seated upright at 90 degrees    Other  Recommendations Oral Care Recommendations: Oral care BID   Follow up Recommendations   n/a     Frequency and Duration   n/a         Prognosis Prognosis for Safe Diet Advancement: Good      Swallow Study   General Date of Onset: 06/01/16 HPI: 76 year old male  admitted 06/01/16 with mesenteric ischemia, s/p aortosuperior mesenteric artery BPG. PMH significant for R CEA, COPD, HLD, HTN, glaucoma, "pre-cancer" of vocal folds, colon CA. Head CT - mild atrophy. Type of Study: Bedside Swallow Evaluation Previous Swallow Assessment: none Diet Prior to this Study: Regular;Thin liquids Temperature Spikes Noted: No Respiratory Status: Room air Behavior/Cognition: Alert;Cooperative;Pleasant mood Oral Cavity Assessment: Within Functional Limits Oral Care Completed by SLP: No Oral Cavity - Dentition: Dentures, top;Dentures, bottom Vision: Functional for self-feeding Self-Feeding Abilities: Able to feed self Patient Positioning: Upright in chair Baseline Vocal Quality: Normal Volitional Cough: Strong Volitional Swallow: Able to elicit    Oral/Motor/Sensory Function Overall Oral Motor/Sensory Function: Within functional limits   Ice Chips Ice chips: Not tested   Thin Liquid Thin Liquid: Within functional limits Presentation: Straw    Nectar Thick Nectar Thick Liquid: Not tested   Honey Thick Honey Thick Liquid: Not tested   Puree Puree: Within functional limits Presentation: Self Fed;Spoon   Solid   GO   Solid: Within functional limits Presentation: [place] B. [place], [place], [place]  Shonna Chock 06/15/2016,11:08 AM

## 2016-06-15 NOTE — Progress Notes (Addendum)
[facility] Progress Note  Subjective  - POD #14  Less confused last night. Ate about 20% of meal last night. Had BMs yesterday.   Objective Vitals:   06/14/16 1945 06/15/16 0438  BP: 136/76 (!) 168/85  Pulse: 78 84  Resp: 18 18  Temp: 98.6 F (37 C) 98.7 F (37.1 C)    Intake/Output Summary (Last 24 hours) at 06/15/16 0737 Last data filed at 06/15/16 0437  Gross per 24 hour  Intake          2810.83 ml  Output              700 ml  Net          2110.83 ml   Alert and oriented today.  Abdomen soft and non tender. Midline incision clean.  Palpable DP pulses bilaterally.   Assessment/Planning: 76 y.o. male is s/p: Supraceliac aorta to celiac and superior mesenteric artery bypass  14 Days Post-Op   Alert today.  Encourage increased po intake. Nursing concerned with swallowing. Will have swallow eval.  Continue mobilization.  Case management to discuss home RN per family request.   Keith Gibson 06/15/2016 7:37 AM --  Less confused Still some loose stools will stop colace and also check cdiff since on antibiotics UTI day 4/10 Cipro Saline lock IV Eating some. Pt and wife have not noted any issues with swallowing Physiologically at this point he is well I believe best place to improve his mental status at this point is at home Will try to set up some temporary home care.  Will d/c home today if we can set this up otherwise d/c am if it takes that long.  Keith Hinds, MD [facility] [place] Office: [phone] Pager: [phone]  Laboratory CBC    Component Value Date/Time   WBC 7.0 06/14/2016 0312   HGB 12.2 (L) 06/14/2016 0312   HCT 36.6 (L) 06/14/2016 0312   PLT 309 06/14/2016 0312    BMET    Component Value Date/Time   NA 133 (L) 06/14/2016 0312   K 4.6 06/14/2016 0312   CL 103 06/14/2016 0312   CO2 22 06/14/2016 0312   GLUCOSE 94 06/14/2016 0312   BUN 7 06/14/2016 0312   CREATININE 1.18  06/14/2016 0312   CALCIUM 9.1 06/14/2016 0312   GFRNONAA 58 (L) 06/14/2016 0312   GFRAA >60 06/14/2016 0312    COAG Lab Results  Component Value Date   INR 1.33 06/01/2016   INR 0.96 05/27/2016   No results found for: PTT  Antibiotics Anti-infectives    Start     Dose/Rate Route Frequency Ordered Stop   06/11/16 0800  ciprofloxacin (CIPRO) tablet 250 mg     250 mg Oral 2 times daily 06/11/16 0744     06/10/16 1200  ciprofloxacin (CIPRO) tablet 500 mg  Status:  Discontinued     500 mg Oral 2 times daily 06/10/16 1136 06/11/16 0744   06/08/16 1800  cefTRIAXone (ROCEPHIN) 1 g in dextrose 5 % 50 mL IVPB  Status:  Discontinued     1 g 100 mL/hr over 30 Minutes Intravenous Every 24 hours 06/08/16 1708 06/08/16 1725   06/08/16 1800  piperacillin-tazobactam (ZOSYN) IVPB 3.375 g  Status:  Discontinued     3.375 g 12.5 mL/hr over 240 Minutes Intravenous Every 8 hours 06/08/16 1726 06/10/16 1136   06/01/16 1900  cefUROXime (ZINACEF) 1.5 g in dextrose 5 % 50 mL IVPB  1.5 g 100 mL/hr over 30 Minutes Intravenous Every 12 hours 06/01/16 1557 06/02/16 0830   06/01/16 0545  cefUROXime (ZINACEF) 1.5 g in dextrose 5 % 50 mL IVPB     1.5 g 100 mL/hr over 30 Minutes Intravenous 30 min pre-op 06/01/16 0545 06/01/16 [place], PA-C [facility] Office: [phone] Pager: [phone] 06/15/2016 7:37 AM

## 2016-06-15 NOTE — Clinical Social Work Note (Signed)
FU with family about bed offers.  The family will take pt home. They have hired a caregiver and DO NOT want SNF any longer.  RNCM updated to set up [facility].  Keith Gibson B. Joline Maxcy Clinical Social Work Dept Weekend Social Worker [phone] 4:15 PM

## 2016-06-15 NOTE — Progress Notes (Signed)
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  Pt transferred to 2W from SICU 1/23. S/p bedside swallow evaluation per SLP. PO intake better, however, highly variable at 10-100% per flowsheets. Receiving Ensure Enlive supplements BID. Noted Megace 400 mg BID added.  Diet Order:  Diet regular Room service appropriate? Yes; Fluid consistency: Thin  Skin:  Reviewed, no issues  Last BM:  1/28  Height:   Ht Readings from Last 1 Encounters:  06/01/16 5\' 7"  (1.702 m)    Weight:   Wt Readings from Last 1 Encounters:  06/01/16 151 lb (68.5 kg)   Wt Readings from Last 20 Encounters:  06/01/16 151 lb (68.5 kg)  05/27/16 151 lb 12.8 oz (68.9 kg)  05/07/16 150 lb (68 kg)  05/06/16 153 lb (69.4 kg)  05/01/16 153 lb (69.4 kg)  04/10/16 152 lb (68.9 kg)  03/26/16 151 lb (68.5 kg)  02/13/16 150 lb (68 kg)  01/29/16 150 lb 0.4 oz (68.1 kg)  08/30/12 172 lb 2 oz (78.1 kg)  03/22/12 171 lb (77.6 kg)  03/08/12 171 lb (77.6 kg)   Ideal Body Weight:  67.2 kg  BMI:  Body mass index is 23.65 kg/m.  Estimated Nutritional Needs:   Kcal:  1700-1900  Protein:  80-90 gm  Fluid:  1.7-1.9 L  EDUCATION NEEDS:   No education needs identified at this time  Arthur Holms, RD, LDN Pager #: [phone] After-Hours Pager #: [phone]

## 2016-06-15 NOTE — Progress Notes (Signed)
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 PMHx: Right CEA, COPD, HLD, HTN, glaucoma   OT comments  Pt with significant improvements.  He is able to perform ADLs with supervision - min guard assist.  Recommend 24 hour supervision.  Will follow.   Follow Up Recommendations  Home health OT    Equipment Recommendations    none   Recommendations for Other Services      Precautions / Restrictions Precautions Precautions: Fall Precaution Comments: dementia, poor intake       Mobility Bed Mobility               General bed mobility comments: pt up in chair  Transfers Overall transfer level: Needs assistance Equipment used: None Transfers: Stand Pivot Transfers;Sit to/from Stand Sit to Stand: Supervision Stand pivot transfers: Supervision            Balance Overall balance assessment: Needs assistance Sitting-balance support: Feet supported Sitting balance-Leahy Scale: Good     Standing balance support: No upper extremity supported Standing balance-Leahy Scale: Fair                     ADL Overall ADL's : Needs assistance/impaired     Grooming: Wash/dry hands;Wash/dry face;Oral care;Brushing hair;Supervision/safety;Standing   Upper Body Bathing: Set up;Sitting   Lower Body Bathing: Supervison/ safety;Sit to/from stand   Upper Body Dressing : Set up;Sitting   Lower Body Dressing: Supervision/safety;Sit to/from stand   Toilet Transfer: Supervision/safety;Ambulation;Comfort height toilet;Regular Toilet;BSC;RW   Toileting- Clothing Manipulation and Hygiene: Supervision/safety;Sit to/from Nurse, children's Details (indicate cue type and reason): recommended to pt and wife that pt sit to shower, they both agree  Functional mobility during ADLs:  Min guard        Vision                     Perception     Praxis      Cognition   Behavior During Therapy: [facility] for tasks assessed/performed Overall Cognitive Status: History of cognitive impairments - at baseline                       Extremity/Trunk Assessment               Exercises     Shoulder Instructions       General Comments      Pertinent Vitals/ Pain       Pain Assessment: No/denies pain  Home Living                                          Prior Functioning/Environment              Frequency  Min 3X/week        Progress Toward Goals  OT Goals(current goals can now be found in the care plan section)  Progress towards OT goals: Progressing toward goals  ADL Goals Pt Will Perform Grooming: with set-up;with supervision;sitting Pt Will Perform Upper Body Bathing: with set-up;with supervision;sitting Pt Will Transfer to Toilet: with supervision;bedside commode;ambulating Pt Will Perform Toileting - Clothing Manipulation and hygiene: with supervision;sit to/from stand Additional ADL Goal #1: Pt will demonstrate emergent awareness during ADL  task in [place] Discharge plan remains appropriate    Co-evaluation                 End of Session     Activity Tolerance Patient tolerated treatment well   Patient Left in chair;with call bell/phone within reach;with family/visitor present   Nurse Communication Mobility status        Time: [id number] (1204)-1223 OT Time Calculation (min): 19 min  Charges: OT General Charges $OT Visit: 1 Procedure OT Treatments $Self Care/Home Management : 8-22 mins  Charlee Whitebread M 06/15/2016, 1:05 PM

## 2016-06-15 NOTE — Progress Notes (Signed)
Discharge instructions and RX and appts explained and provided to patient with verbalize understanding from pt and wife. Pt discharged via wheelchair with family at side in no acute distress.

## 2016-06-15 NOTE — Care Management Important Message (Signed)
Important Message  Patient Details  Name: Keith Gibson MRN: [id number] Date of Birth: 1940/12/08   Medicare Important Message Given:  Yes    [name] May 06/15/2016, 1:08 PM

## 2016-06-25 ENCOUNTER — Encounter: Payer: Self-pay | Admitting: Vascular Surgery

## 2016-07-02 ENCOUNTER — Ambulatory Visit (INDEPENDENT_AMBULATORY_CARE_PROVIDER_SITE_OTHER): Payer: Self-pay | Admitting: Vascular Surgery

## 2016-07-02 ENCOUNTER — Encounter: Payer: Self-pay | Admitting: Vascular Surgery

## 2016-07-02 VITALS — BP 136/85 | HR 83 | Temp 97.3°F | Resp 20 | Ht 67.0 in | Wt 135.5 lb

## 2016-07-02 DIAGNOSIS — K551 Chronic vascular disorders of intestine: Secondary | ICD-10-CM

## 2016-07-02 NOTE — Progress Notes (Signed)
Patient is a 76 year old male who returns for postoperative follow-up today. He underwent aorta to superior mesenteric and celiac artery bypass 06/01/2016. His postoperative course was complicated primarily by delirium. He does have some baseline dementia and was on Aricept preoperatively. This usually manifested itself by some confusion about where he was when driving. He still is having some confusion at home but this is improved from during hospitalization. He sometimes forgets where he is during the daytime. He does not really have any neurologic deficits from a motor standpoint. He is eating without pain. His weight was 135 pounds recently. His appetite is slowly improving. He did have a head CT while in the hospital which showed no evidence of stroke.  Physical exam:  Vitals:   07/02/16 1405  BP: 136/85  Pulse: 83  Resp: 20  Temp: 97.3 F (36.3 C)  TempSrc: Oral  SpO2: 98%  Weight: 135 lb 8 oz (61.5 kg)  Height: 5\' 7"  (1.702 m)    Abdomen: Soft nontender nondistended well-healed midline laparotomy incision of evidence of hernia 2+ femoral pulses  Neuro: Symmetric upper extremity lower extremity motor strength 5 over 5 no facial asymmetry  Assessment: Slowly improving from aorta mesenteric bypass overall deconditioned currently. Still has some mental status changes which I think most likely are some mild progression of his dementia. He is currently taking his Aricept.  Plan: Follow-up 6 months mesenteric duplex exam. We will also arrange for him to be seen by neurology to make sure there is no other etiology for his cognitive function problems other than dementia.  [name], MD [facility] [place] Office: [phone] Pager: [phone]

## 2016-07-07 ENCOUNTER — Ambulatory Visit (INDEPENDENT_AMBULATORY_CARE_PROVIDER_SITE_OTHER): Payer: Medicare Other | Admitting: Neurology

## 2016-07-07 ENCOUNTER — Encounter: Payer: Self-pay | Admitting: Neurology

## 2016-07-07 VITALS — BP 152/95 | HR 80 | Ht 67.0 in | Wt 133.5 lb

## 2016-07-07 DIAGNOSIS — K559 Vascular disorder of intestine, unspecified: Secondary | ICD-10-CM

## 2016-07-07 DIAGNOSIS — F0390 Unspecified dementia without behavioral disturbance: Secondary | ICD-10-CM | POA: Diagnosis not present

## 2016-07-07 NOTE — Progress Notes (Signed)
PATIENT: Keith Gibson DOB: 10-22-1940  Chief Complaint  Patient presents with  . Dementia    MMSE 29/30 - 5 animals.  He is here with his wife, Keith Gibson.  He has concerns over worsening of his short-term memory.  He is currently taking donepezil 10mg  daily.  . Vascular Surgery    Elam Dutch, MD - referring MD  . PCP    [place]  Keith Gibson is a 76 years old right handed male, seen in refer by vascular surgeon Dr. Elam Dutch for evaluation of memory loss, his primary care is  [facility], he is accompanied by his wife. Initial evaluation is on Jul 07 2016.  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  He underwent aorta to superior mesenteric and celiac artery bypass on May 22 2016, his postoperative course was complicated by delirium, UTI.  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  He was noted to have memory loss since 2016, he lost directions easily, he has to stop to think how to get to places.  He is still able to keep books in balance, but began to make some mistakes.  He has lost appetite, had significant weight loss since 2016, 30 Lb over 2 years. He also tends to withdraw, was started on Aricept since summer of 2017.   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 difficile, BMP showed low sodium 133, GFR 58, CBC showed decreased hemoglobin 12.2, prealbumin 16.2, evidence of UTI,  REVIEW OF SYSTEMS: Full 14 system review of systems performed and notable only for as above  ALLERGIES: Allergies  Allergen Reactions  . No Known Allergies     HOME MEDICATIONS: Current Outpatient Prescriptions  Medication Sig Dispense Refill  . Artificial Tear Solution (SYSTANE CONTACTS) SOLN Place 1 drop into both eyes 3 (three) times daily.     Marland Kitchen aspirin EC 81 MG tablet Take 81 mg by mouth daily.    Marland Kitchen atorvastatin (LIPITOR) 40 MG tablet Take 40 mg by mouth daily.    . brimonidine (ALPHAGAN P) 0.1 % SOLN Place 1 drop into both eyes every 8 (eight) hours.     . ciprofloxacin (CIPRO) 250 MG tablet Take 1 tablet (250 mg total) by mouth 2 (two) times daily. 10 tablet 0  . donepezil (ARICEPT) 10 MG tablet Take 10 mg by mouth every morning.    . latanoprost (XALATAN) 0.005 % ophthalmic solution Place 1 drop into the left eye at bedtime.    . megestrol (MEGACE) 400 MG/10ML suspension Take 10 mLs (400 mg total) by mouth 2 (two) times daily. 240 mL 3  . Multiple Vitamins-Minerals (CENTRUM SILVER 50+MEN PO) Take 1 tablet by mouth daily.    . Multiple  Vitamins-Minerals (OCUVITE ADULT 50+ PO) Take 1 tablet by mouth daily.     . Omega-3 Fatty Acids (FISH OIL) 1200 MG CAPS Take 1,200 mg by mouth daily.     . sildenafil (VIAGRA) 25 MG tablet Take 25 mg by mouth daily as needed for erectile dysfunction.     No current facility-administered medications for this visit.     PAST MEDICAL HISTORY: Past Medical History:  Diagnosis Date  . 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 Cataract    bil removed  . COPD (chronic obstructive pulmonary disease) ([place])   . Glaucoma   . Hyperlipidemia   . Hypertension   . Memory loss   . Personal history of adenomatous  colonic polyps  03/22/2012   Adenomas 2003 and 2005 01/2010 2.5 cm TV adenoma of cecum removed 05/2010 - residual TV adenoma removal   . Shortness of breath   . Stroke ([place])    2015  . Wears dentures    full top-partial bottom  . Wears glasses   . Wears hearing aid    both ears    PAST SURGICAL HISTORY: Past Surgical History:  Procedure Laterality Date  . APPENDECTOMY     patient denies  . CAROTID ENDARTERECTOMY Right 05/08/2014  . COLONOSCOPY    . EYE SURGERY Bilateral     laser, cataract surgery  . KNEE SURGERY  1990   arthroscopic on right knee  . MESENTERIC ARTERY BYPASS N/A 06/01/2016   Procedure: AORTO SUPERIOR MESENTERIC AND CELIAC ARTERY BYPASS;  Surgeon: Elam Dutch, MD;  Location: [facility];  Service: Vascular;  Laterality: N/A;  . PERIPHERAL VASCULAR CATHETERIZATION N/A 04/10/2016   Procedure: Abdominal Aortogram;  Surgeon: Elam Dutch, MD;  Location: [place] [facility];  Service: Cardiovascular;  Laterality: N/A;  . PERIPHERAL VASCULAR CATHETERIZATION Bilateral 04/10/2016   Procedure: Lower Extremity Angiography;  Surgeon: Elam Dutch, MD;  Location: [place] [facility];  Service: Cardiovascular;  Laterality: Bilateral;  . SHOULDER ARTHROSCOPY WITH ROTATOR CUFF REPAIR AND SUBACROMIAL DECOMPRESSION Right 08/30/2012   Procedure: RIGHT SHOULDER ARTHROSCOPY WITH SUBACROMIAL DECOMPRESSION, DISTAL CLAVICLE RESECTION AND ROTATOR CUFF REPAIR;  Surgeon: Cammie Sickle., MD;  Location: [place];  Service: Orthopedics;  Laterality: Right;  . TONSILLECTOMY    . vocal cord surg  1992   removed precancerous polyps    FAMILY HISTORY: Family History  Problem Relation Age of Onset  . Heart Problems Mother   . Other Father     mining accident  . Colon cancer Neg Hx     SOCIAL HISTORY:  Social History   Social History  . Marital status: Married    Spouse name: N/A  . Number of children: 4  . Years of education: College   Occupational History  . Retired     Social History Main Topics  . Smoking status: Former Smoker    Types: Cigarettes    Quit date: 03/08/1997  . Smokeless tobacco: Never Used  . Alcohol use No  . Drug use: No  . Sexual activity: Not on file   Other Topics Concern  . Not on file   Social History Narrative   Lives at home with his wife.   Right-handed.   No caffeine use.     PHYSICAL EXAM   Vitals:   07/07/16 1113  BP: (!) 152/95  Pulse: 80  Weight: 133 lb 8 oz (60.6 kg)  Height: 5\' 7"  (1.702 m)  Not recorded      Body mass index is 20.91 kg/m.  PHYSICAL EXAMNIATION:  Gen: NAD, conversant, well nourised, obese, well groomed                     Cardiovascular: Regular rate rhythm, no peripheral edema, warm, nontender. Eyes: Conjunctivae clear without exudates or hemorrhage Neck: Supple, no carotid bruits. Pulmonary: Clear to auscultation bilaterally   NEUROLOGICAL EXAM:  MENTAL STATUS: Speech:    Speech is normal; fluent and spontaneous with normal comprehension.  Cognition: MMSE 29/30, animal naming 5     Orientation to time, place and person     Recent and remote memory: missed 1/3 recall     Normal Attention span and concentration     Normal Language, naming, repeating,spontaneous speech     Fund of knowledge   CRANIAL NERVES: CN II: Visual fields are full to confrontation. Fundoscopic exam is normal with sharp discs and no vascular changes. Pupils are round equal and briskly reactive to light. CN III, IV, VI: extraocular movement are normal. No ptosis. CN V: Facial sensation is intact to pinprick in all 3 divisions bilaterally. Corneal responses are intact.  CN VII: Face is symmetric with normal eye closure and smile. CN VIII: Hearing is normal to rubbing fingers CN IX, X: Palate elevates symmetrically. Phonation is normal. CN XI: Head turning and shoulder shrug are intact CN XII: Tongue is midline with normal movements and no atrophy.  MOTOR: There is no pronator drift of  out-stretched arms. Muscle bulk and tone are normal. Muscle strength is normal.  REFLEXES: Reflexes are 2+ and symmetric at the biceps, triceps, knees, and ankles. Plantar responses are flexor.  SENSORY: Intact to light touch, pinprick, positional sensation and vibratory sensation are intact in fingers and toes.  COORDINATION: Rapid alternating movements and fine finger movements are intact. There is no dysmetria on finger-to-nose and heel-knee-shin.    GAIT/STANCE: Posture is normal. Gait is steady with normal steps, base, arm swing, and turning. Heel and toe walking are normal. Tandem gait is normal.  Romberg is absent.   DIAGNOSTIC DATA (LABS, IMAGING, TESTING) - I reviewed patient records, labs, notes, testing and imaging myself where available.   ASSESSMENT AND PLAN  Keith Gibson is a 76 y.o. male   Mild Cognitive Impairment,  MMSE 29/30, positive family history, post operative delirium  Most suggestive of CNS degenerative disorders.  MRI brain.  Laboratory evaluations to rule out treatable etiology   Marcial Pacas, M.D. Ph.D.  [facility] [address] [place], [place] [address] Ph: [phone] Fax: [phone]  CC: Elam Dutch, MD, [facility]

## 2016-07-08 LAB — ANA W/REFLEX IF POSITIVE: ANA: NEGATIVE

## 2016-07-08 LAB — TSH: TSH: 0.653 u[IU]/mL (ref 0.450–4.500)

## 2016-07-08 LAB — VITAMIN B12: Vitamin B-12: 599 pg/mL (ref 232–1245)

## 2016-07-08 LAB — SEDIMENTATION RATE: SED RATE: 18 mm/h (ref 0–30)

## 2016-07-08 LAB — FOLATE: Folate: >20 ng/mL (ref >3.0)

## 2016-07-08 LAB — C-REACTIVE PROTEIN: CRP: 1.1 mg/L (ref 0.0–4.9)

## 2016-07-09 NOTE — Addendum Note (Signed)
Addended by: Lianne Cure A on: 07/09/2016 04:17 PM   Modules accepted: Orders

## 2016-07-15 ENCOUNTER — Ambulatory Visit
Admission: RE | Admit: 2016-07-15 | Discharge: 2016-07-15 | Disposition: A | Discharge status: Home / Self Care | Payer: Medicare Other | Source: Ambulatory Visit | Attending: Neurology | Admitting: Neurology

## 2016-07-15 DIAGNOSIS — F0390 Unspecified dementia without behavioral disturbance: Secondary | ICD-10-CM

## 2016-07-15 DIAGNOSIS — K559 Vascular disorder of intestine, unspecified: Secondary | ICD-10-CM

## 2016-09-07 ENCOUNTER — Encounter: Payer: Self-pay | Admitting: Neurology

## 2016-09-07 ENCOUNTER — Ambulatory Visit (INDEPENDENT_AMBULATORY_CARE_PROVIDER_SITE_OTHER): Payer: Medicare Other | Admitting: Neurology

## 2016-09-07 VITALS — BP 138/76 | HR 67 | Ht 67.0 in | Wt 140.0 lb

## 2016-09-07 DIAGNOSIS — K559 Vascular disorder of intestine, unspecified: Secondary | ICD-10-CM

## 2016-09-07 DIAGNOSIS — F0390 Unspecified dementia without behavioral disturbance: Secondary | ICD-10-CM

## 2016-09-07 MED ORDER — CLOPIDOGREL BISULFATE 75 MG PO TABS: 75.0000 mg | ORAL_TABLET | Freq: Every day | ORAL | 11 refills | Status: DC

## 2016-09-07 MED ORDER — MEMANTINE HCL 10 MG PO TABS: 10.0000 mg | ORAL_TABLET | Freq: Two times a day (BID) | ORAL | 4 refills | Status: DC

## 2016-09-07 NOTE — Progress Notes (Signed)
PATIENT: Keith Gibson DOB: [birth year]  Chief Complaint  Patient presents with  . Memory Loss    MMSE 27/30 - 5 animals.  He is here with his wife, Keith Gibson.  They would like to review his MRI.  His PCP took him off donepezil due to a persistent runny nose.  She changed him to memantine 75m, BID.     HISTORICAL  Keith HAVLINis a 76years old right handed male, seen in refer by vascular surgeon Dr. CElam Dutchfor evaluation of memory loss, [facility] he is accompanied by his wife. Initial evaluation is on Jul 07 2016.  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  He underwent aorta to superior mesenteric and celiac artery bypass on May 22 2016, his postoperative course was complicated by delirium, UTI.  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  He is still able to keep books in balance, but began to make some mistakes.  He has lost appetite, had significant weight loss since 2016, 30 Lb over 2 years. He also tends to withdraw, was started on Aricept since summer of 2017.   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 No Known Allergies     HOME MEDICATIONS: Current Outpatient Prescriptions  Medication Sig Dispense Refill  . Artificial Tear Solution (SYSTANE CONTACTS) SOLN Place 1 drop into both eyes 3 (three) times daily.     .Marland Kitchenaspirin EC 81 MG tablet Take 81 mg by mouth daily.    . brimonidine (ALPHAGAN P) 0.1 % SOLN Place 1 drop into both eyes every 8 (eight) hours.     .Marland Kitchenlatanoprost (XALATAN) 0.005 % ophthalmic solution Place 1 drop into the left eye at bedtime.    . memantine (NAMENDA) 5 MG tablet Take 5 mg by mouth 2 (two) times daily.    . Multiple Vitamins-Minerals (CENTRUM SILVER 50+MEN  PO) Take 1 tablet by mouth daily.    . Omega-3 Fatty Acids (FISH OIL) 1200 MG CAPS Take 1,200 mg by mouth daily.     . sildenafil (VIAGRA) 25 MG tablet Take 25 mg by mouth daily as needed for erectile dysfunction.     No current facility-administered medications for this visit.     PAST MEDICAL HISTORY: Past Medical History:  Diagnosis Date  . Cancer ([place]) throat /1998   precancerous polyps  . Cataract    bil removed  . COPD (chronic obstructive pulmonary disease) ([place])   . Glaucoma   . Hyperlipidemia   . Hypertension   . Memory loss   . Personal  history of adenomatous  colonic polyps 03/22/2012   Adenomas 2003 and 2005 01/2010 2.5 cm TV adenoma of cecum removed 05/2010 - residual TV adenoma removal   . Shortness of breath   . Stroke ([place])    2015  . Wears dentures    full top-partial bottom  . Wears glasses   . Wears hearing aid    both ears    PAST SURGICAL HISTORY: Past Surgical History:  Procedure Laterality Date  . APPENDECTOMY     patient denies  . CAROTID ENDARTERECTOMY Right 05/08/2014  . COLONOSCOPY    . EYE SURGERY Bilateral     laser, cataract surgery  . KNEE SURGERY  1990   arthroscopic on right knee  . MESENTERIC ARTERY BYPASS N/A 06/01/2016   Procedure: AORTO SUPERIOR MESENTERIC AND CELIAC ARTERY BYPASS;  Surgeon: Elam Dutch, MD;  Location: [facility];  Service: Vascular;  Laterality: N/A;  . PERIPHERAL VASCULAR CATHETERIZATION N/A 04/10/2016   Procedure: Abdominal Aortogram;  Surgeon: Elam Dutch, MD;  Location: [facility];  Service: Cardiovascular;  Laterality: N/A;  . PERIPHERAL VASCULAR CATHETERIZATION Bilateral 04/10/2016   Procedure: Lower Extremity Angiography;  Surgeon: Elam Dutch, MD;  Location: [facility];  Service: Cardiovascular;  Laterality: Bilateral;  . SHOULDER ARTHROSCOPY WITH ROTATOR CUFF REPAIR AND SUBACROMIAL DECOMPRESSION Right 08/30/2012   Procedure: RIGHT SHOULDER ARTHROSCOPY WITH SUBACROMIAL DECOMPRESSION, DISTAL CLAVICLE RESECTION AND ROTATOR CUFF REPAIR;  Surgeon: Cammie Sickle., MD;  Location: [facility];  Service: Orthopedics;  Laterality: Right;  . TONSILLECTOMY    . vocal cord surg  1992   removed precancerous polyps    FAMILY HISTORY: Family History  Problem Relation Age of Onset  . Heart Problems Mother   . Other Father     mining accident  . Colon cancer Neg Hx     SOCIAL HISTORY:  Social History   Social History  . Marital status: Married    Spouse name: N/A  . Number of children: 4  . Years of education: College    Occupational History  . Retired    Social History Main Topics  . Smoking status: Former Smoker    Types: Cigarettes    Quit date: 03/08/1997  . Smokeless tobacco: Never Used  . Alcohol use No  . Drug use: No  . Sexual activity: Not on file   Other Topics Concern  . Not on file   Social History Narrative   Lives at home with his wife.   Right-handed.   No caffeine use.     PHYSICAL EXAM   Vitals:   09/07/16 1125  BP: 138/76  Pulse: 67  Weight: 140 lb (63.5 kg)  Height: 5' 7" (1.702 m)    Not recorded      Body  mass index is 21.93 kg/m.  PHYSICAL EXAMNIATION:  Gen: NAD, conversant, well nourised, obese, well groomed                     Cardiovascular: Regular rate rhythm, no peripheral edema, warm, nontender. Eyes: Conjunctivae clear without exudates or hemorrhage Neck: Supple, no carotid bruits. Pulmonary: Clear to auscultation bilaterally   NEUROLOGICAL EXAM:  MMSE - Mini Mental State Exam 09/07/2016 07/07/2016  Orientation to time 4 5  Orientation to Place 5 5  Registration 3 3  Attention/ Calculation 5 5  Recall 2 2  Language- name 2 objects 2 2  Language- repeat 1 1  Language- follow 3 step command 3 3  Language- read & follow direction 1 1  Write a sentence 1 1  Copy design 0 1  Total score 27 29     CRANIAL NERVES: CN II: Visual fields are full to confrontation. Fundoscopic exam is normal with sharp discs and no vascular changes. Pupils are round equal and briskly reactive to light. CN III, IV, VI: extraocular movement are normal. No ptosis. CN V: Facial sensation is intact to pinprick in all 3 divisions bilaterally. Corneal responses are intact.  CN VII: Face is symmetric with normal eye closure and smile. CN VIII: Hearing is normal to rubbing fingers CN IX, X: Palate elevates symmetrically. Phonation is normal. CN XI: Head turning and shoulder shrug are intact CN XII: Tongue is midline with normal movements and no  atrophy.  MOTOR: There is no pronator drift of out-stretched arms. Muscle bulk and tone are normal. Muscle strength is normal.  REFLEXES: Reflexes are 2+ and symmetric at the biceps, triceps, knees, and ankles. Plantar responses are flexor.  SENSORY: Intact to light touch, pinprick, positional sensation and vibratory sensation are intact in fingers and toes.  COORDINATION: Rapid alternating movements and fine finger movements are intact. There is no dysmetria on finger-to-nose and heel-knee-shin.    GAIT/STANCE: Posture is normal. Gait is steady with normal steps, base, arm swing, and turning. Heel and toe walking are normal. Tandem gait is normal.  Romberg is absent.   DIAGNOSTIC DATA (LABS, IMAGING, TESTING) - I reviewed patient records, labs, notes, testing and imaging myself where available.   ASSESSMENT AND PLAN  Keith Gibson is a 75 y.o. 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 Ph.D.  [facility] [address], [place], [place] [address] Ph: [phone] Fax: [phone]  CC: Elam Dutch, MD, [facility]

## 2016-10-17 NOTE — Addendum Note (Signed)
Addendum  created 10/17/16 0813 by Duane Boston, MD   Sign clinical note

## 2016-11-30 ENCOUNTER — Other Ambulatory Visit: Payer: Self-pay | Admitting: Podiatry

## 2016-11-30 ENCOUNTER — Encounter
Admission: RE | Admit: 2016-11-30 | Discharge: 2016-11-30 | Disposition: A | Discharge status: Home / Self Care | Payer: Medicare Other | Source: Ambulatory Visit | Attending: Podiatry | Admitting: Podiatry

## 2016-11-30 DIAGNOSIS — Z01818 Encounter for other preprocedural examination: Secondary | ICD-10-CM | POA: Insufficient documentation

## 2016-11-30 HISTORY — DX: Adverse effect of unspecified anesthetic, initial encounter: T41.45XA

## 2016-11-30 HISTORY — DX: Chronic kidney disease, unspecified: N18.9

## 2016-11-30 NOTE — Pre-Procedure Instructions (Addendum)
Per Dr. Cleda Mccreedy pt is not to stop daily 81 mg of aspirin. Wife and pt given this instruction.  Pt and wife expressed that patient had a negative reaction to anesthesia in January of this year, he was slow to wake up and then became violent.  For this reason, they do not want him to receive general anesthesia.if at all possible.

## 2016-11-30 NOTE — Patient Instructions (Signed)
  Your procedure is scheduled [id number] July 20 , 2018. Report to Same Day Surgery. To find out your arrival time please call [phone] between 1PM - 3PM on Thursday 19, 2018.  Remember: Instructions that are not followed completely may result in serious medical risk, up to and including death, or upon the discretion of your surgeon and anesthesiologist your surgery may need to be rescheduled.    _x___ 1. Do not eat food or drink liquids after midnight. No gum chewing or hard candies.     ____ 2. No Alcohol for 24 hours before or after surgery.   ____ 3. Bring all medications with you on the day of surgery if instructed.    __x__ 4. Notify your doctor if there is any change in your medical condition     (cold, fever, infections).    _____ 5. No smoking 24 hours prior to surgery.     Do not wear jewelry, make-up, hairpins, clips or nail polish.  Do not wear lotions, powders, or perfumes.   Do not shave 48 hours prior to surgery. Men may shave face and neck.  Do not bring valuables to the hospital.    [facility] is not responsible for any belongings or valuables.               Contacts, dentures or bridgework may not be worn into surgery.  Leave your suitcase in the car. After surgery it may be brought to your room.  For patients admitted to the hospital, discharge time is determined by your treatment team.   Patients discharged the day of surgery will not be allowed to drive home.    Please read over the following fact sheets that you were given:   [facility] Preparing for Surgery  ____ Take these medicines the morning of surgery with A SIP OF WATER:     ____ Fleet Enema (as directed)   _x___ Use CHG Soap as directed on instruction sheet  ____ Use inhalers on the day of surgery and bring to hospital day of surgery  ____ Stop metformin 2 days prior to surgery    ____ Take 1/2 of usual insulin dose the night before surgery and none on the morning of  surgery.   ____  Stop Coumadin/Plavix/aspirin on   _x___ Stop Anti-inflammatories such as Advil, Aleve, Ibuprofen, Motrin, Naproxen, Naprosyn, Goodies powders or aspirin  products. OK to take Tylenol.   _x___ Stop supplementsOmega-3 Fatty Acids (FISH OIL) : until after surgery.    ____ Bring C-Pap to the hospital.

## 2016-11-30 NOTE — Pre-Procedure Instructions (Signed)
Called Dr. Rosann Auerbach office to request pre-op orders.

## 2016-11-30 NOTE — Pre-Procedure Instructions (Signed)
EKG 06/14/2016 NSR in Epic.

## 2016-12-03 MED ORDER — CEFAZOLIN SODIUM-DEXTROSE 2-4 GM/100ML-% IV SOLN
2.0000 g | INTRAVENOUS | Status: AC
  Administered 2016-12-04: 2 g via INTRAVENOUS

## 2016-12-04 ENCOUNTER — Encounter: Payer: Self-pay | Admitting: *Deleted

## 2016-12-04 ENCOUNTER — Ambulatory Visit
Admission: RE | Admit: 2016-12-04 | Discharge: 2016-12-04 | Disposition: A | Discharge status: Home / Self Care | Payer: Medicare Other | Source: Ambulatory Visit | Attending: Podiatry | Admitting: Podiatry

## 2016-12-04 ENCOUNTER — Encounter
Admission: RE | Disposition: A | Discharge status: Home / Self Care | Payer: Self-pay | Source: Ambulatory Visit | Attending: Podiatry

## 2016-12-04 ENCOUNTER — Ambulatory Visit: Payer: Medicare Other | Admitting: Anesthesiology

## 2016-12-04 DIAGNOSIS — E785 Hyperlipidemia, unspecified: Secondary | ICD-10-CM | POA: Insufficient documentation

## 2016-12-04 DIAGNOSIS — I10 Essential (primary) hypertension: Secondary | ICD-10-CM | POA: Insufficient documentation

## 2016-12-04 DIAGNOSIS — Z8673 Personal history of transient ischemic attack (TIA), and cerebral infarction without residual deficits: Secondary | ICD-10-CM | POA: Diagnosis not present

## 2016-12-04 DIAGNOSIS — Z86010 Personal history of colonic polyps: Secondary | ICD-10-CM | POA: Diagnosis not present

## 2016-12-04 DIAGNOSIS — H409 Unspecified glaucoma: Secondary | ICD-10-CM | POA: Diagnosis not present

## 2016-12-04 DIAGNOSIS — J449 Chronic obstructive pulmonary disease, unspecified: Secondary | ICD-10-CM | POA: Diagnosis not present

## 2016-12-04 DIAGNOSIS — Z85819 Personal history of malignant neoplasm of unspecified site of lip, oral cavity, and pharynx: Secondary | ICD-10-CM | POA: Insufficient documentation

## 2016-12-04 DIAGNOSIS — R0602 Shortness of breath: Secondary | ICD-10-CM | POA: Diagnosis not present

## 2016-12-04 DIAGNOSIS — Z888 Allergy status to other drugs, medicaments and biological substances status: Secondary | ICD-10-CM | POA: Insufficient documentation

## 2016-12-04 DIAGNOSIS — M899 Disorder of bone, unspecified: Secondary | ICD-10-CM | POA: Insufficient documentation

## 2016-12-04 DIAGNOSIS — M2041 Other hammer toe(s) (acquired), right foot: Secondary | ICD-10-CM | POA: Insufficient documentation

## 2016-12-04 DIAGNOSIS — N289 Disorder of kidney and ureter, unspecified: Secondary | ICD-10-CM | POA: Diagnosis not present

## 2016-12-04 DIAGNOSIS — Z79899 Other long term (current) drug therapy: Secondary | ICD-10-CM | POA: Diagnosis not present

## 2016-12-04 DIAGNOSIS — Z7982 Long term (current) use of aspirin: Secondary | ICD-10-CM | POA: Insufficient documentation

## 2016-12-04 DIAGNOSIS — Z9841 Cataract extraction status, right eye: Secondary | ICD-10-CM | POA: Diagnosis not present

## 2016-12-04 HISTORY — PX: AMPUTATION TOE: SHX6595

## 2016-12-04 SURGERY — AMPUTATION, TOE
Anesthesia: General | Laterality: Right | Wound class: Clean

## 2016-12-04 MED ORDER — ONDANSETRON HCL 4 MG/2ML IJ SOLN
INTRAMUSCULAR | Status: AC
  Filled 2016-12-04: qty 2

## 2016-12-04 MED ORDER — DEXAMETHASONE SODIUM PHOSPHATE 10 MG/ML IJ SOLN
INTRAMUSCULAR | Status: AC
  Filled 2016-12-04: qty 1

## 2016-12-04 MED ORDER — CEFAZOLIN SODIUM-DEXTROSE 2-4 GM/100ML-% IV SOLN
INTRAVENOUS | Status: AC
  Filled 2016-12-04: qty 100

## 2016-12-04 MED ORDER — FENTANYL CITRATE (PF) 100 MCG/2ML IJ SOLN: 25.0000 ug | INTRAMUSCULAR | Status: DC | PRN

## 2016-12-04 MED ORDER — LIDOCAINE HCL (PF) 2 % IJ SOLN
INTRAMUSCULAR | Status: AC
  Filled 2016-12-04: qty 2

## 2016-12-04 MED ORDER — LACTATED RINGERS IV SOLN
INTRAVENOUS | Status: DC
  Administered 2016-12-04: 06:00:00 via INTRAVENOUS

## 2016-12-04 MED ORDER — FENTANYL CITRATE (PF) 100 MCG/2ML IJ SOLN
INTRAMUSCULAR | Status: DC | PRN
  Administered 2016-12-04: 25 ug via INTRAVENOUS

## 2016-12-04 MED ORDER — FAMOTIDINE 20 MG PO TABS
ORAL_TABLET | ORAL | Status: AC
  Filled 2016-12-04: qty 1

## 2016-12-04 MED ORDER — PROPOFOL 10 MG/ML IV BOLUS
INTRAVENOUS | Status: AC
  Filled 2016-12-04: qty 20

## 2016-12-04 MED ORDER — DEXAMETHASONE SODIUM PHOSPHATE 10 MG/ML IJ SOLN
INTRAMUSCULAR | Status: DC | PRN
  Administered 2016-12-04: 5 mg via INTRAVENOUS

## 2016-12-04 MED ORDER — ONDANSETRON HCL 4 MG/2ML IJ SOLN
INTRAMUSCULAR | Status: DC | PRN
  Administered 2016-12-04: 4 mg via INTRAVENOUS

## 2016-12-04 MED ORDER — LIDOCAINE HCL (CARDIAC) 20 MG/ML IV SOLN
INTRAVENOUS | Status: DC | PRN
  Administered 2016-12-04: 60 mg via INTRAVENOUS

## 2016-12-04 MED ORDER — BUPIVACAINE HCL (PF) 0.5 % IJ SOLN
INTRAMUSCULAR | Status: AC
  Filled 2016-12-04: qty 30

## 2016-12-04 MED ORDER — HYDROCODONE-ACETAMINOPHEN 5-325 MG PO TABS: 1.0000 | ORAL_TABLET | ORAL | 0 refills | Status: DC | PRN

## 2016-12-04 MED ORDER — PROPOFOL 500 MG/50ML IV EMUL
INTRAVENOUS | Status: DC | PRN
  Administered 2016-12-04: 75 ug/kg/min via INTRAVENOUS

## 2016-12-04 MED ORDER — BUPIVACAINE HCL 0.5 % IJ SOLN
INTRAMUSCULAR | Status: DC | PRN
  Administered 2016-12-04: 9 mL

## 2016-12-04 MED ORDER — LIDOCAINE HCL (PF) 1 % IJ SOLN
INTRAMUSCULAR | Status: AC
  Filled 2016-12-04: qty 30

## 2016-12-04 MED ORDER — NEOMYCIN-POLYMYXIN B GU 40-200000 IR SOLN
Status: AC
  Filled 2016-12-04: qty 2

## 2016-12-04 MED ORDER — FENTANYL CITRATE (PF) 100 MCG/2ML IJ SOLN
INTRAMUSCULAR | Status: AC
Start: 2016-12-04 — End: 2016-12-04
  Filled 2016-12-04: qty 2

## 2016-12-04 MED ORDER — POVIDONE-IODINE 7.5 % EX SOLN
Freq: Once | CUTANEOUS | Status: DC
  Filled 2016-12-04: qty 118

## 2016-12-04 MED ORDER — ONDANSETRON HCL 4 MG/2ML IJ SOLN: 4.0000 mg | Freq: Once | INTRAMUSCULAR | Status: DC | PRN

## 2016-12-04 MED ORDER — FAMOTIDINE 20 MG PO TABS
20.0000 mg | ORAL_TABLET | Freq: Once | ORAL | Status: AC
  Administered 2016-12-04: 20 mg via ORAL

## 2016-12-04 MED ORDER — NEOMYCIN-POLYMYXIN B GU 40-200000 IR SOLN
Status: DC | PRN
  Administered 2016-12-04: 2 mL

## 2016-12-04 MED ORDER — PROPOFOL 10 MG/ML IV BOLUS
INTRAVENOUS | Status: DC | PRN
  Administered 2016-12-04: 40 mg via INTRAVENOUS

## 2016-12-04 SURGICAL SUPPLY — 46 items
BANDAGE ACE 4X5 VEL STRL LF (GAUZE/BANDAGES/DRESSINGS) ×3 IMPLANT
BLADE MED AGGRESSIVE (BLADE) ×3 IMPLANT
BLADE OSC/SAGITTAL MD 5.5X18 (BLADE) ×3 IMPLANT
BLADE SURG 15 STRL LF DISP TIS (BLADE) ×2 IMPLANT
BLADE SURG 15 STRL SS (BLADE) ×6
BLADE SURG MINI STRL (BLADE) ×3 IMPLANT
BNDG CMPR 75X21 PLY HI ABS (MISCELLANEOUS) ×1
BNDG ESMARK 4X12 TAN STRL LF (GAUZE/BANDAGES/DRESSINGS) ×3 IMPLANT
BNDG GAUZE 4.5X4.1 6PLY STRL (MISCELLANEOUS) ×3 IMPLANT
CANISTER SUCT 1200ML W/VALVE (MISCELLANEOUS) ×3 IMPLANT
CLOSURE WOUND 1/4X4 (GAUZE/BANDAGES/DRESSINGS) ×1
CUFF TOURN 18 STER (MISCELLANEOUS) ×3 IMPLANT
CUFF TOURN DUAL PL 12 NO SLV (MISCELLANEOUS) ×3 IMPLANT
DRAPE FLUOR MINI C-ARM 54X84 (DRAPES) ×3 IMPLANT
DURAPREP 26ML APPLICATOR (WOUND CARE) ×3 IMPLANT
ELECT REM PT RETURN 9FT ADLT (ELECTROSURGICAL) ×3
ELECTRODE REM PT RTRN 9FT ADLT (ELECTROSURGICAL) ×1 IMPLANT
GAUZE PETRO XEROFOAM 1X8 (MISCELLANEOUS) ×3 IMPLANT
GAUZE SPONGE 4X4 12PLY STRL (GAUZE/BANDAGES/DRESSINGS) ×3 IMPLANT
GAUZE STRETCH 2X75IN STRL (MISCELLANEOUS) ×3 IMPLANT
GLOVE BIO SURGEON STRL SZ7.5 (GLOVE) ×3 IMPLANT
GLOVE INDICATOR 8.0 STRL GRN (GLOVE) ×3 IMPLANT
GOWN STRL REUS W/ TWL LRG LVL3 (GOWN DISPOSABLE) ×2 IMPLANT
GOWN STRL REUS W/TWL LRG LVL3 (GOWN DISPOSABLE) ×6
HANDPIECE VERSAJET DEBRIDEMENT (MISCELLANEOUS) ×3 IMPLANT
KIT RM TURNOVER STRD PROC AR (KITS) ×3 IMPLANT
LABEL OR SOLS (LABEL) ×3 IMPLANT
NEEDLE FILTER BLUNT 18X 1/2SAF (NEEDLE) ×2
NEEDLE FILTER BLUNT 18X1 1/2 (NEEDLE) ×1 IMPLANT
NEEDLE HYPO 25X1 1.5 SAFETY (NEEDLE) ×6 IMPLANT
NS IRRIG 500ML POUR BTL (IV SOLUTION) ×3 IMPLANT
PACK EXTREMITY ARMC (MISCELLANEOUS) ×3 IMPLANT
SOL .9 NS 3000ML IRR  AL (IV SOLUTION) ×2
SOL .9 NS 3000ML IRR AL (IV SOLUTION) ×1
SOL .9 NS 3000ML IRR UROMATIC (IV SOLUTION) ×1 IMPLANT
SOL PREP PVP 2OZ (MISCELLANEOUS) ×3
SOLUTION PREP PVP 2OZ (MISCELLANEOUS) ×1 IMPLANT
STOCKINETTE STRL 6IN 960660 (GAUZE/BANDAGES/DRESSINGS) ×3 IMPLANT
STRIP CLOSURE SKIN 1/4X4 (GAUZE/BANDAGES/DRESSINGS) ×2 IMPLANT
SUT ETHILON 3-0 FS-10 30 BLK (SUTURE) ×3
SUT ETHILON 4-0 (SUTURE)
SUT ETHILON 4-0 FS2 18XMFL BLK (SUTURE)
SUTURE EHLN 3-0 FS-10 30 BLK (SUTURE) ×1 IMPLANT
SUTURE ETHLN 4-0 FS2 18XMF BLK (SUTURE) IMPLANT
SWAB DUAL CULTURE TRANS RED ST (MISCELLANEOUS) ×3 IMPLANT
SYRINGE 10CC LL (SYRINGE) ×3 IMPLANT

## 2016-12-04 NOTE — Interval H&P Note (Signed)
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 After consideration of risks, benefits and other options for treatment, the patient has consented to  Procedure(s): AMPUTATION TOE/distal amputation rt 5th/IPJ (Right) as a surgical intervention .  The patient's history has been reviewed, patient examined, no change in status, stable for surgery.  I have reviewed the patient's chart and labs.  Questions were answered to the patient's satisfaction.     [name]

## 2016-12-04 NOTE — Discharge Instructions (Addendum)
1. Elevate right lower extremity.  2. Keep the bandage on the right foot clean, dry, and do not remove.  3. Sponge bathe only right lower extremity.  4. Wear surgical shoe on the right foot whenever walking or standing.  5. Take one pain pill, Norco, every 4 hours if needed for pain.   AMBULATORY SURGERY  DISCHARGE INSTRUCTIONS   1) The drugs that you were given will stay in your system until tomorrow so for the next 24 hours you should not:  A) Drive an automobile B) Make any legal decisions C) Drink any alcoholic beverage   2) You may resume regular meals tomorrow.  Today it is better to start with liquids and gradually work up to solid foods.  You may eat anything you prefer, but it is better to start with liquids, then soup and crackers, and gradually work up to solid foods.   3) Please notify your doctor immediately if you have any unusual bleeding, trouble breathing, redness and pain at the surgery site, drainage, fever, or pain not relieved by medication.   4) Additional Instructions:

## 2016-12-04 NOTE — Anesthesia Postprocedure Evaluation (Signed)
Anesthesia Post Note  Patient: Keith Gibson  Procedure(s) Performed: Procedure(s) (LRB): AMPUTATION TOE/distal amputation rt 5th/IPJ (Right)  Patient location during evaluation: PACU Anesthesia Type: General Level of consciousness: awake and alert Pain management: pain level controlled Vital Signs Assessment: post-procedure vital signs reviewed and stable Respiratory status: spontaneous breathing, nonlabored ventilation, respiratory function stable and patient connected to nasal cannula oxygen Cardiovascular status: blood pressure returned to baseline and stable Postop Assessment: no signs of nausea or vomiting Anesthetic complications: no     Last Vitals:  Vitals:   12/04/16 0855 12/04/16 0921  BP: (!) 167/96 (!) 171/94  Pulse: 70 71  Resp:  16  Temp: (!) 36.1 C     Last Pain:  Vitals:   12/04/16 0855  TempSrc: Temporal                 Martha Clan

## 2016-12-04 NOTE — Transfer of Care (Signed)
Immediate Anesthesia Transfer of Care Note  Patient: Keith Gibson  Procedure(s) Performed: Procedure(s): AMPUTATION TOE/distal amputation rt 5th/IPJ (Right)  Patient Location: PACU  Anesthesia Type:General  Level of Consciousness: drowsy  Airway & Oxygen Therapy: Patient Spontanous Breathing and Patient connected to nasal cannula oxygen  Post-op Assessment: Report given to RN and Post -op Vital signs reviewed and stable  Post vital signs: Reviewed and stable  Last Vitals:  Vitals:   12/04/16 0610 12/04/16 0810  BP: (!) 172/86 133/77  Pulse: 63 (!) 54  Resp: 18 11  Temp: (!) 36.4 C 36.6 C    Last Pain:  Vitals:   12/04/16 0610  TempSrc: Oral         Complications: No apparent anesthesia complications

## 2016-12-04 NOTE — H&P (Signed)
  Medical history and physical in the chart was reviewed.  Patient stable for surgery. 

## 2016-12-04 NOTE — Anesthesia Preprocedure Evaluation (Signed)
Anesthesia Evaluation  Patient identified by MRN, date of birth, ID band Patient awake    Reviewed: Allergy & Precautions, H&P , NPO status , Patient's Chart, lab work & pertinent test results  History of Anesthesia Complications (+) history of anesthetic complications (post-op delirium)  Airway Mallampati: II  TM Distance: >3 FB Neck ROM: Full    Dental  (+) Edentulous Upper, Partial Lower, Dental Advisory Given   Pulmonary shortness of breath and with exertion, neg sleep apnea, COPD, neg recent URI, former smoker,           Cardiovascular Exercise Tolerance: Good hypertension, (-) angina+ Peripheral Vascular Disease  (-) CAD, (-) Past MI, (-) Cardiac Stents and (-) CABG (-) dysrhythmias (-) Valvular Problems/Murmurs  Study Highlights     Nuclear stress EF: 63%.  There was no ST segment deviation noted during stress.  The study is normal.  This is a low risk study.  The left ventricular ejection fraction is normal (55-65%).   Normal pharmacologic nuclear stress test with no evidence of prior infarct or ischemia.       Neuro/Psych neg Seizures PSYCHIATRIC DISORDERS (Dementia) CVA    GI/Hepatic negative GI ROS, Neg liver ROS,   Endo/Other  negative endocrine ROS  Renal/GU CRFRenal disease     Musculoskeletal   Abdominal   Peds  Hematology   Anesthesia Other Findings Past Medical History: throat /1998: Cancer ([place])     Comment:  precancerous polyps No date: Cataract     Comment:  bil removed No date: Chronic kidney disease     Comment:  low functioning kidney disease 33/8250: Complication of anesthesia     Comment:  pt was hard to wake up, then when he woke up he became               viloent. No date: COPD (chronic obstructive pulmonary disease) ([facility]) No date: Glaucoma No date: Hyperlipidemia No date: Hypertension No date: Memory loss 03/22/2012: Personal history of adenomatous  colonic polyps    Comment:  Adenomas 2003 and 2005 01/2010 2.5 cm TV adenoma of cecum              removed 05/2010 - residual TV adenoma removal  No date: Shortness of breath No date: Stroke [facility])     Comment:  2015 No date: Wears dentures     Comment:  full top-partial bottom No date: Wears glasses No date: Wears hearing aid     Comment:  both ears   Reproductive/Obstetrics negative OB ROS                             Anesthesia Physical  Anesthesia Plan  ASA: III  Anesthesia Plan: General   Post-op Pain Management:    Induction: Intravenous  PONV Risk Score and Plan: 2 and Propofol  Airway Management Planned: Natural Airway and Nasal Cannula  Additional Equipment:   Intra-op Plan:   Post-operative Plan:   Informed Consent: I have reviewed the patients History and Physical, chart, labs and discussed the procedure including the risks, benefits and alternatives for the proposed anesthesia with the patient or authorized representative who has indicated his/her understanding and acceptance.   Dental advisory given  Plan Discussed with: CRNA, Surgeon and Anesthesiologist  Anesthesia Plan Comments:         Anesthesia Quick Evaluation

## 2016-12-04 NOTE — Op Note (Signed)
Date of operation: 12/04/2016.  Surgeon: [name] DPM.  Preoperative diagnosis: Hammertoe with exostosis right fifth toe.  Postoperative diagnosis: Same.  Procedure: Distal amputation right fifth toe.  Anesthesia: Local Mac.  Hemostasis: Pneumatic tourniquet right ankle 250 mmHg.  Estimated blood loss: Less than 5 cc.  Pathology: Distal right fifth toe.  Complications: None apparent.  Operative indications: This is a 76 year old male with chronic history of a painful sore on his right fifth toe from a hammertoe with bony prominence. Patient elects to just have distal amputation of his fifth toe.  Operative procedure: Patient was taken to the operating room and placed on the table in the supine position. Going satisfactory sedation the right foot and fifth toe area was anesthetized with 9 cc of 0.5% bupivacaine plain. Pneumatic tourniquet applied at the level of the right ankle and the foot was prepped and draped in usual sterile fashion. The foot was exsanguinated and the tourniquet inflated to 250 mmHg.     Attention was directed to the distal aspect of the right fifth toe where a fishmouth type incision was made from medial to lateral just proximal to the nail plate. Incision was deepened down to the level of the bone and periosteal dissection carried back to the level of the distal interphalangeal joint where the toe was disarticulated and removed in toto. The wound was flushed with copious amounts of sterile saline and closed using 4-0 nylon simple interrupted sutures. Xeroform 4 x 4's and conformer applied to the right foot. Tourniquet released and blood flow noted to return immediately to the other digits. Patient tolerated the procedure and anesthesia well and was transported to PACU with vital signs stable and in good condition.

## 2016-12-04 NOTE — Anesthesia Post-op Follow-up Note (Cosign Needed)
Anesthesia QCDR form completed.        

## 2016-12-07 ENCOUNTER — Ambulatory Visit: Payer: Self-pay | Admitting: Nurse Practitioner

## 2016-12-07 ENCOUNTER — Telehealth: Payer: Self-pay | Admitting: Neurology

## 2016-12-07 NOTE — Telephone Encounter (Signed)
Pt's wife called, moved his appt to this Wednesday 7/25. Said she sent a letter dated 7/10 addressed to Dr Krista Blue and is wanting to know if it has been rec'd. Please call

## 2016-12-07 NOTE — Telephone Encounter (Addendum)
Returned call and left message that the letter was received and reviewed by Dr. Krista Blue. Appt pending 12/09/16.

## 2016-12-08 LAB — SURGICAL PATHOLOGY

## 2016-12-09 ENCOUNTER — Ambulatory Visit (INDEPENDENT_AMBULATORY_CARE_PROVIDER_SITE_OTHER): Payer: Medicare Other | Admitting: Neurology

## 2016-12-09 ENCOUNTER — Encounter: Payer: Self-pay | Admitting: Neurology

## 2016-12-09 VITALS — BP 139/78 | HR 72 | Ht 66.0 in | Wt 141.0 lb

## 2016-12-09 DIAGNOSIS — K559 Vascular disorder of intestine, unspecified: Secondary | ICD-10-CM

## 2016-12-09 DIAGNOSIS — F0390 Unspecified dementia without behavioral disturbance: Secondary | ICD-10-CM

## 2016-12-09 MED ORDER — MEMANTINE HCL 10 MG PO TABS: 10.0000 mg | ORAL_TABLET | Freq: Two times a day (BID) | ORAL | 11 refills | Status: DC

## 2016-12-09 NOTE — Patient Instructions (Signed)
      NO DRIVING

## 2016-12-09 NOTE — Progress Notes (Signed)
PATIENT: Keith Gibson DOB: 1940-08-23  Chief Complaint  Patient presents with  . Memory Loss    MMSE 24/30 - 11 animals.  He is here with his wife, [name].  She feels his memory is worse and he seems more confused.  There is concern that it is unsafe for him to continue driving.     HISTORICAL  Keith Gibson is a 76 years old right handed male, seen in refer by vascular surgeon Dr. Elam Dutch for evaluation of memory loss, his primary care is  [facility], he is accompanied by his wife. Initial evaluation is on Jul 07 2016.  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  He underwent aorta to superior mesenteric and celiac artery bypass on May 22 2016, his postoperative course was complicated by delirium, UTI.  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  He was noted to have memory loss since 2016, he lost directions easily, he has to stop to think how to get to places.  He is still able to keep books in balance, but began to make some mistakes.  He has lost appetite, had significant weight loss since 2016, 30 Lb over 2 years. He also tends to withdraw, was started on Aricept since summer of 2017.   I reviewed angiogram of abdomen in October 2017, mesenteric artery are patent, but there is stenosis at origin of the celiac trunk and superior mesenteric artery, which is hemodynamic significant, it was deemed that the patient symptoms of rapid weight loss, lack of appetite can be associated with chronic mesenteric ischemia,  Laboratory evaluation in January 2018  negative C. 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   REVIEW OF SYSTEMS: Full 14 system review of systems performed and notable only for as above  ALLERGIES: Allergies  Allergen Reactions  . No Known Allergies     HOME MEDICATIONS: Current Outpatient Prescriptions  Medication Sig Dispense Refill  . Artificial Tear Solution (SYSTANE CONTACTS) SOLN Place 1 drop into both eyes 3 (three) times daily.     Marland Kitchen aspirin EC 81  MG tablet Take 81 mg by mouth daily.    Marland Kitchen atorvastatin (LIPITOR) 40 MG tablet TAKE 1 (ONE) TABLET BY MOUTH AT BEDTIME FOR HIGH CHOLESTEROL  0  . brimonidine (ALPHAGAN P) 0.1 % SOLN Place 1 drop into both eyes every 8 (eight) hours.     Marland Kitchen latanoprost (XALATAN) 0.005 % ophthalmic solution Place 1 drop into the left eye at bedtime.    . memantine (NAMENDA) 5 MG tablet TAKE 1 TABLET BY MOUTH TWICE A DAY FOR MEMORY LOSS  5  . Multiple Vitamins-Minerals (CENTRUM SILVER 50+MEN PO) Take 1 tablet by mouth daily.    .  Multiple Vitamins-Minerals (OCUVITE ADULT 50+) CAPS Take 1 capsule by mouth daily.    . Omega-3 Fatty Acids (FISH OIL) 1200 MG CAPS Take 1,200 mg by mouth daily.      No current facility-administered medications for this visit.     PAST MEDICAL HISTORY: Past Medical History:  Diagnosis Date  . Cancer ([place]) throat /1998   precancerous polyps  . Cataract    bil removed  . Chronic kidney disease    low functioning kidney disease  . Complication of anesthesia 05/2016   pt was hard to wake up, then when he woke up he became viloent.  Marland Kitchen COPD (chronic obstructive pulmonary disease) ([place])   . Glaucoma   . Hyperlipidemia   . Hypertension   . Memory loss   . 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 Shortness of breath   . Stroke ([place])    2015  . Wears dentures    full top-partial bottom  . Wears glasses   . Wears hearing aid    both ears    PAST SURGICAL HISTORY: Past Surgical History:  Procedure Laterality Date  . 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 APPENDECTOMY     patient denies  . CAROTID ENDARTERECTOMY Right 05/08/2014  . COLONOSCOPY    . EYE SURGERY Bilateral     laser, cataract surgery  . KNEE SURGERY  1990   arthroscopic on right knee  . MESENTERIC ARTERY BYPASS N/A 06/01/2016   Procedure: AORTO SUPERIOR MESENTERIC AND CELIAC ARTERY BYPASS;  Surgeon: Elam Dutch, MD;  Location: [place];  Service: Vascular;  Laterality: N/A;  . PERIPHERAL VASCULAR CATHETERIZATION N/A 04/10/2016   Procedure: Abdominal Aortogram;  Surgeon: Elam Dutch, MD;  Location: [place] [facility];  Service: Cardiovascular;  Laterality: N/A;  . PERIPHERAL VASCULAR CATHETERIZATION Bilateral 04/10/2016   Procedure: Lower  Extremity Angiography;  Surgeon: Elam Dutch, MD;  Location: [place] [facility];  Service: Cardiovascular;  Laterality: Bilateral;  . SHOULDER ARTHROSCOPY WITH ROTATOR CUFF REPAIR AND SUBACROMIAL DECOMPRESSION Right 08/30/2012   Procedure: RIGHT SHOULDER ARTHROSCOPY WITH SUBACROMIAL DECOMPRESSION, DISTAL CLAVICLE RESECTION AND ROTATOR CUFF REPAIR;  Surgeon: Cammie Sickle., MD;  Location: [place];  Service: Orthopedics;  Laterality: Right;  . TONSILLECTOMY    . vocal cord surg  1992   removed precancerous polyps    FAMILY HISTORY: Family History  Problem Relation Age of Onset  . Heart Problems Mother   . Other Father        mining accident  . Colon cancer Neg Hx  SOCIAL HISTORY:  Social History   Social History  . Marital status: Married    Spouse name: N/A  . Number of children: 4  . Years of education: College   Occupational History  . Retired    Social History Main Topics  . Smoking status: Former Smoker    Types: Cigarettes    Quit date: 03/08/1997  . Smokeless tobacco: Never Used  . Alcohol use No  . Drug use: No  . Sexual activity: Not on file   Other Topics Concern  . Not on file   Social History Narrative   Lives at home with his wife.   Right-handed.   No caffeine use.     PHYSICAL EXAM   Vitals:   12/09/16 1124  BP: 139/78  Pulse: 72  Weight: 141 lb (64 kg)  Height: '5\' 6"'$  (1.676 m)    Not recorded      Body mass index is 22.76 kg/m.  PHYSICAL EXAMNIATION:  Gen: NAD, conversant, well nourised, obese, well groomed                     Cardiovascular: Regular rate rhythm, no peripheral edema, warm, nontender. Eyes: Conjunctivae clear without exudates or hemorrhage Neck: Supple, no carotid bruits. Pulmonary: Clear to auscultation bilaterally   NEUROLOGICAL EXAM:  MMSE - Mini Mental State Exam 12/09/2016 09/07/2016 07/07/2016  Orientation to time '4 4 5  '$ Orientation to Place '5 5 5  '$ Registration '3 3 3    '$ Attention/ Calculation '3 5 5  '$ Recall '1 2 2  '$ Language- name 2 objects '2 2 2  '$ Language- repeat '1 1 1  '$ Language- follow 3 step command '3 3 3  '$ Language- read & follow direction '1 1 1  '$ Write a sentence '1 1 1  '$ Copy design 0 0 1  Total score '24 27 29  '$ Animal naming 11   CRANIAL NERVES: CN II: Visual fields are full to confrontation. Fundoscopic exam is normal with sharp discs and no vascular changes. Pupils are round equal and briskly reactive to light. CN III, IV, VI: extraocular movement are normal. No ptosis. CN V: Facial sensation is intact to pinprick in all 3 divisions bilaterally. Corneal responses are intact.  CN VII: Face is symmetric with normal eye closure and smile. CN VIII: Hearing is normal to rubbing fingers CN IX, X: Palate elevates symmetrically. Phonation is normal. CN XI: Head turning and shoulder shrug are intact CN XII: Tongue is midline with normal movements and no atrophy.  MOTOR: There is no pronator drift of out-stretched arms. Muscle bulk and tone are normal. Muscle strength is normal.  REFLEXES: Reflexes are 2+ and symmetric at the biceps, triceps, knees, and ankles. Plantar responses are flexor.  SENSORY: Intact to light touch, pinprick, positional sensation and vibratory sensation are intact in fingers and toes.  COORDINATION: Rapid alternating movements and fine finger movements are intact. There is no dysmetria on finger-to-nose and heel-knee-shin.    GAIT/STANCE: Posture is normal. Gait is steady with normal steps, base, arm swing, and turning. Heel and toe walking are normal. Tandem gait is normal.  Romberg is absent.   DIAGNOSTIC DATA (LABS, IMAGING, TESTING) - I reviewed patient records, labs, notes, testing and imaging myself where available.   ASSESSMENT AND PLAN  HART HAAS is a 76 y.o. 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 Ph.D.  [facility] [address] [place], [place] [address] Ph: [phone] Fax: [phone]  CC: Elam Dutch, MD, [facility]

## 2016-12-29 ENCOUNTER — Encounter: Payer: Self-pay | Admitting: Vascular Surgery

## 2017-01-07 ENCOUNTER — Ambulatory Visit (INDEPENDENT_AMBULATORY_CARE_PROVIDER_SITE_OTHER): Payer: Medicare Other | Admitting: Vascular Surgery

## 2017-01-07 ENCOUNTER — Encounter: Payer: Self-pay | Admitting: Vascular Surgery

## 2017-01-07 ENCOUNTER — Ambulatory Visit (HOSPITAL_COMMUNITY)
Admission: RE | Admit: 2017-01-07 | Discharge: 2017-01-07 | Disposition: A | Discharge status: Home / Self Care | Payer: Medicare Other | Source: Ambulatory Visit | Attending: Vascular Surgery | Admitting: Vascular Surgery

## 2017-01-07 VITALS — BP 176/90 | HR 65 | Temp 97.1°F | Resp 18 | Ht 66.0 in | Wt 145.0 lb

## 2017-01-07 DIAGNOSIS — K551 Chronic vascular disorders of intestine: Secondary | ICD-10-CM

## 2017-01-07 DIAGNOSIS — I739 Peripheral vascular disease, unspecified: Secondary | ICD-10-CM

## 2017-01-07 NOTE — Progress Notes (Signed)
Patient is a 76 year old male who returns for follow-up today. He previously underwent aorta to superior mesenteric artery and celiac artery bypass January 2018. He has regained some weight and currently weighs 145 pounds on our scale today. He denies any abdominal pain or food fear. He is mostly frustrated with his mild dementia. He was recently seen by neurology and told he should not drive at this point.  Review of systems: He denies shortness of breath. He denies chest pain. He does have some memory issues primarily short-term memory issues and is assisted by his wife.  Physical exam:  Vitals:   01/07/17 0957  BP: (!) 176/90  Pulse: 65  Resp: 18  Temp: (!) 97.1 F (36.2 C)  SpO2: 95%  Weight: 145 lb (65.8 kg)  Height: 5\' 6"  (1.676 m)    Abdomen: Soft nontender nondistended no hernia 2+ femoral pulses no abdominal bruits  Data: Patient had a mesenteric duplex exam today which showed wide patency of his aortic mesenteric graft.  Assessment: Doing well status post mesenteric bypass. Patient is clearly frustrated by his dementia symptoms and his restrictions on driving. I discussed this at length today with him and his wife regarding it as a safety issue.  Plan: The patient will follow-up in one year with a duplex of his aortic mesenteric bypass graft and see our nurse practitioner.  [name], MD [facility] Office: [phone] Pager: [phone]

## 2017-01-19 NOTE — Addendum Note (Signed)
Addended by: Lianne Cure A on: 01/19/2017 12:56 PM   Modules accepted: Orders

## 2017-01-21 ENCOUNTER — Ambulatory Visit: Payer: Self-pay | Admitting: Neurology

## 2017-05-27 ENCOUNTER — Encounter: Payer: Self-pay | Admitting: Cardiovascular Disease

## 2017-05-27 ENCOUNTER — Ambulatory Visit: Payer: Medicare Other | Admitting: Cardiovascular Disease

## 2017-05-27 VITALS — BP 146/82 | HR 65 | Ht 66.0 in | Wt 147.0 lb

## 2017-05-27 DIAGNOSIS — E782 Mixed hyperlipidemia: Secondary | ICD-10-CM | POA: Diagnosis not present

## 2017-05-27 DIAGNOSIS — K559 Vascular disorder of intestine, unspecified: Secondary | ICD-10-CM | POA: Diagnosis not present

## 2017-05-27 NOTE — Patient Instructions (Addendum)
Medication Instructions:  Your physician recommends that you continue on your current medications as directed. Please refer to the Current Medication list given to you today.   Labwork: None Ordered   Testing/Procedures: None Ordered   Follow-Up: Your physician recommends that you schedule a follow-up appointment in: as needed with Dr. Nahser   If you need a refill on your cardiac medications before your next appointment, please call your pharmacy.   Thank you for choosing [facility]! Kou Gucciardo, RN [phone]    

## 2017-05-27 NOTE — Progress Notes (Signed)
Cardiology Office Note   Date:  05/27/2017   ID:  Keith Gibson, Keith Gibson 1940-07-16, MRN [id number]  PCP:  [facility]  Cardiologist:   Mertie Moores, MD   Chief Complaint  Patient presents with  . Follow-up    cad   Problems 1. Peripheral arterial disease-mesenteric stenosis 2. 2. History of stroke-status post right carotid endarterectomy ([place], [place])   3. COPD 4. Hyperlipidemia 5. HTN:       History of Present Illness: Keith Gibson is a 77 y.o. male who presents for eval for his PVD. Seen with sife ,  [name].  Here for pre-op evaluation prior to mesenteric bypass.  Has lost 20 lbs over the past year or so.   No abdominal pain with eating. .   10-15 years ago, he had an attempted stress test but he cold not finish the test due to shortness of breath  Is active, does all of his yard work.  Is retired from [employer]. Then went into [employer], then [employer].   Smoked for 40 years and quit 10 years ago   Occasional episodes of chest pressure and DOE ( carrying a flower pot from one side of the yard to the other.  May 27, 2017:  Doing well s/p mesentaric vascular surgery .  Had some complications  Doing well now.  No CP or dyspnea.  Active, does yard work  Weight has been stable  Checks BP at home,  Always normal range.    Past Medical History:  Diagnosis Date  . Cancer ([place]) throat /1998   precancerous polyps  . Cataract    bil removed  . Chronic kidney disease    low functioning kidney disease  . Complication of anesthesia 05/2016   pt was hard to wake up, then when he woke up he became viloent.  Marland Kitchen COPD (chronic obstructive pulmonary disease) ([place])   . Glaucoma   . Hyperlipidemia   . Hypertension   . Memory loss   . Personal history of adenomatous  colonic polyps 03/22/2012   Adenomas 2003 and 2005 01/2010 2.5 cm TV adenoma of cecum removed 05/2010 - residual TV adenoma removal     . Shortness of breath   . Stroke ([place])    2015  . Wears dentures    full top-partial bottom  . Wears glasses   . Wears hearing aid    both ears    Past Surgical History:  Procedure Laterality Date  . 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 APPENDECTOMY     patient denies  . CAROTID ENDARTERECTOMY Right 05/08/2014  . COLONOSCOPY    . EYE SURGERY Bilateral     laser, cataract surgery  . KNEE SURGERY  1990   arthroscopic on right knee  . MESENTERIC ARTERY BYPASS N/A 06/01/2016   Procedure: AORTO SUPERIOR MESENTERIC AND CELIAC ARTERY BYPASS;  Surgeon: Elam Dutch, MD;  Location: [place];  Service: Vascular;  Laterality: N/A;  . PERIPHERAL VASCULAR CATHETERIZATION N/A 04/10/2016   Procedure: Abdominal Aortogram;  Surgeon: Elam Dutch, MD;  Location: [place] [facility];  Service: Cardiovascular;  Laterality: N/A;  . PERIPHERAL VASCULAR CATHETERIZATION Bilateral 04/10/2016   Procedure: Lower Extremity Angiography;  Surgeon: Elam Dutch, MD;  Location: [place] [facility];  Service: Cardiovascular;  Laterality:  Bilateral;  . SHOULDER ARTHROSCOPY WITH ROTATOR CUFF REPAIR AND SUBACROMIAL DECOMPRESSION Right 08/30/2012   Procedure: RIGHT SHOULDER ARTHROSCOPY WITH SUBACROMIAL DECOMPRESSION, DISTAL CLAVICLE RESECTION AND ROTATOR CUFF REPAIR;  Surgeon: Cammie Sickle., MD;  Location: [place];  Service: Orthopedics;  Laterality: Right;  . TONSILLECTOMY    . vocal cord surg  1992   removed precancerous polyps     Current Outpatient Medications  Medication Sig Dispense Refill  . Artificial Tear Solution (SYSTANE CONTACTS) SOLN Place 1 drop into both eyes 3 (three) times daily.     Marland Kitchen aspirin EC 81 MG tablet Take 81 mg by mouth daily.    Marland Kitchen atorvastatin (LIPITOR) 40 MG tablet TAKE 1 (ONE) TABLET BY MOUTH AT BEDTIME FOR HIGH  CHOLESTEROL  0  . brimonidine (ALPHAGAN P) 0.1 % SOLN Place 1 drop into both eyes every 8 (eight) hours.     Marland Kitchen latanoprost (XALATAN) 0.005 % ophthalmic solution Place 1 drop into the left eye at bedtime.    . memantine (NAMENDA) 10 MG tablet Take 1 tablet (10 mg total) by mouth 2 (two) times daily. 60 tablet 11  . Multiple Vitamins-Minerals (CENTRUM SILVER 50+MEN PO) Take 1 tablet by mouth daily.    . Multiple Vitamins-Minerals (OCUVITE ADULT 50+) CAPS Take 1 capsule by mouth daily.    . Omega-3 Fatty Acids (FISH OIL) 1200 MG CAPS Take 1,200 mg by mouth daily.      No current facility-administered medications for this visit.     Allergies:   No known allergies    Social History:  The patient  reports that he quit smoking about 20 years ago. His smoking use included cigarettes. he has never used smokeless tobacco. He reports that he does not drink alcohol or use drugs.   Family History:  The patient's family history includes Heart Problems in his mother; Other in his father.    ROS: Noted in the current history.  All other systems are negative.   Physical Exam: Blood pressure (!) 146/82, pulse 65, height 5\' 6"  (1.676 m), weight 147 lb (66.7 kg), head circumference 1" (2.5 cm), SpO2 97 %.  GEN:  Well nourished, well developed in no acute distress HEENT: Normal NECK: No JVD; No carotid bruits LYMPHATICS: No lymphadenopathy CARDIAC: RR, normal S1S2 RESPIRATORY:  Clear to auscultation without rales, wheezing or rhonchi  ABDOMEN: Soft, non-tender, non-distended MUSCULOSKELETAL:  No edema; No deformity  SKIN: Warm and dry NEUROLOGIC:  Alert and oriented x 3   EKG: May 27, 2017: Normal sinus rhythm at 65 beats a minute.  EKG is normal.   Recent Labs: 06/04/2016: ALT 44; Magnesium 2.2 06/14/2016: BUN 7; Creatinine, Ser 1.18; Hemoglobin 12.2; Platelets 309; Potassium 4.6; Sodium 133 07/07/2016: TSH 0.653    Lipid Panel No results found for: CHOL, TRIG, HDL, CHOLHDL, VLDL,  LDLCALC, LDLDIRECT    Wt Readings from Last 3 Encounters:  05/27/17 147 lb (66.7 kg)  01/07/17 145 lb (65.8 kg)  12/09/16 141 lb (64 kg)      Other studies Reviewed: Additional studies/ records that were reviewed today include: . Review of the above records demonstrates:    ASSESSMENT AND PLAN:  1.    Peripheral arterial disease: Mr. Maher is status post mesenteric bypass surgery.  He is doing quite well.  Is not having any episodes of chest pain or shortness of breath.  I will see him on an as-needed basis.  2.  Hyperlipidemia: His lipids are managed by his primary  medical doctor.  Continue current medications.   Current medicines are reviewed at length with the patient today.  The patient does not have concerns regarding medicines.  Labs/ tests ordered today include:   Orders Placed This Encounter  Procedures  . EKG 12-Lead     Disposition:   FU with me in 1 year.       Mertie Moores, MD  05/27/2017 9:11 AM    [facility] [address], [place], [place]  [address] Phone: [phone]; Fax: [phone]

## 2017-06-10 ENCOUNTER — Encounter: Payer: Self-pay | Admitting: Internal Medicine

## 2017-06-11 NOTE — Progress Notes (Signed)
[facility]  PATIENT: Keith Gibson DOB: 11-27-1940   REASON FOR VISIT: Follow-up for memory loss HISTORY FROM: Patient and wife [name]    HISTORY OF PRESENT ILLNESS:Keith Gibson is a 77 years old right handed male, seen in refer by vascular surgeon Dr. Elam Dutch for evaluation of memory loss, his primary care is  [facility], he is accompanied by his wife. Initial evaluation is on Jul 07 2016.  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  He underwent aorta to superior mesenteric and celiac artery bypass on May 22 2016, his postoperative course was complicated by delirium, UTI.  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  He was noted to have memory loss since 2016, he lost directions easily, he has to stop to think how to get to places.  He is still able to keep books in balance, but began to make some mistakes.  He has lost appetite, had significant weight loss since 2016, 30 Lb over 2 years. He also tends to withdraw, was started on Aricept since summer of 2017.   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 UPDATE 1/28/2019CM [place], 77 year old male returns for follow-up with history of memory loss.  He reports good and bad days.  He is currently on Namenda 10 mg twice a day without side effects.  She was unable to tolerate Aricept.  He is no longer driving.  He is not in the research trial and at this point does not want to be.  He remains independent in ADLs.  Appetite is good and he is sleeping well.  He gets little exercise.  He returns for reevaluation  REVIEW OF SYSTEMS: Full 14 system review of systems performed and notable only  for those listed, all others are neg:  Constitutional: neg  Cardiovascular: neg Ear/Nose/Throat: neg  Skin: neg Eyes: neg Respiratory: neg Gastroitestinal: neg  Hematology/Lymphatic: neg  Endocrine: neg Musculoskeletal:neg Allergy/Immunology: neg Neurological: Memory loss Psychiatric: Decreased concentration Sleep : neg   ALLERGIES: Allergies  Allergen Reactions  . No Known Allergies     HOME MEDICATIONS: Outpatient Medications Prior  to Visit  Medication Sig Dispense Refill  . Artificial Tear Solution (SYSTANE CONTACTS) SOLN Place 1 drop into both eyes 3 (three) times daily.     Marland Kitchen aspirin EC 81 MG tablet Take 81 mg by mouth daily.    Marland Kitchen atorvastatin (LIPITOR) 40 MG tablet TAKE 1 (ONE) TABLET BY MOUTH AT BEDTIME FOR HIGH CHOLESTEROL  0  . brimonidine (ALPHAGAN P) 0.1 % SOLN Place 1 drop into both eyes every 8 (eight) hours.     Marland Kitchen latanoprost (XALATAN) 0.005 % ophthalmic solution Place 1 drop into the left eye at bedtime.    Marland Kitchen lisinopril (PRINIVIL,ZESTRIL) 5 MG tablet Take 5 mg by mouth daily.    . memantine (NAMENDA) 10 MG tablet Take 1 tablet (10 mg total) by mouth 2 (two) times daily. 60 tablet 11  . Multiple Vitamins-Minerals (CENTRUM SILVER 50+MEN PO) Take 1 tablet by mouth daily.    . Multiple Vitamins-Minerals (OCUVITE ADULT 50+) CAPS Take 1 capsule by mouth daily.    . Omega-3 Fatty Acids (FISH OIL) 1200 MG CAPS Take 1,200 mg by mouth daily.     . potassium chloride (K-DUR) 10 MEQ tablet Take 10 mEq by mouth as needed (as needed for leg cramps).     No facility-administered medications prior to visit.     PAST MEDICAL HISTORY: Past Medical History:  Diagnosis Date  . Cancer ([place]) throat /1998   precancerous polyps  . Cataract    bil removed  . Chronic kidney disease    low functioning kidney disease  . Complication of anesthesia 05/2016   pt was hard to wake up, then when he woke up he became viloent.  Marland Kitchen COPD (chronic obstructive pulmonary disease) ([place])   . Glaucoma   . Hyperlipidemia   . Hypertension   . Memory loss   . 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 Shortness of breath   . Stroke ([place])    2015  . Wears dentures    full top-partial bottom  . Wears glasses   . Wears hearing aid    both ears    PAST SURGICAL HISTORY: Past Surgical History:  Procedure Laterality  Date  . 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 APPENDECTOMY     patient denies  . CAROTID ENDARTERECTOMY Right 05/08/2014  . COLONOSCOPY    . EYE SURGERY Bilateral     laser, cataract surgery  . KNEE SURGERY  1990   arthroscopic on right knee  . MESENTERIC ARTERY BYPASS N/A 06/01/2016   Procedure: AORTO SUPERIOR MESENTERIC AND CELIAC ARTERY BYPASS;  Surgeon: Elam Dutch, MD;  Location: [place];  Service: Vascular;  Laterality: N/A;  . PERIPHERAL VASCULAR CATHETERIZATION N/A 04/10/2016   Procedure: Abdominal Aortogram;  Surgeon: Elam Dutch, MD;  Location: [place] [facility];  Service: Cardiovascular;  Laterality: N/A;  . PERIPHERAL VASCULAR  CATHETERIZATION Bilateral 04/10/2016   Procedure: Lower Extremity Angiography;  Surgeon: Elam Dutch, MD;  Location: [place] [facility];  Service: Cardiovascular;  Laterality: Bilateral;  . SHOULDER ARTHROSCOPY WITH ROTATOR CUFF REPAIR AND SUBACROMIAL DECOMPRESSION Right 08/30/2012   Procedure: RIGHT SHOULDER ARTHROSCOPY WITH SUBACROMIAL DECOMPRESSION, DISTAL CLAVICLE RESECTION AND ROTATOR CUFF REPAIR;  Surgeon: Cammie Sickle., MD;  Location: [place];  Service: Orthopedics;  Laterality: Right;  . TONSILLECTOMY    . vocal cord surg  1992   removed precancerous polyps    FAMILY HISTORY: Family History  Problem Relation Age of Onset  . Heart Problems Mother   . Other Father        mining accident  . Colon cancer Neg Hx     SOCIAL HISTORY: Social History   Socioeconomic History  . Marital status: Married    Spouse name: Not on file  . Number of children: 4  . Years of education: College  . Highest education level: Not on file  Social Needs  . Financial resource strain: Not on file  . Food insecurity - worry: Not on file  . Food insecurity - inability: Not on file  .  Transportation needs - medical: Not on file  . Transportation needs - non-medical: Not on file  Occupational History  . Occupation: Retired  Tobacco Use  . Smoking status: Former Smoker    Types: Cigarettes    Last attempt to quit: 03/08/1997    Years since quitting: 20.2  . Smokeless tobacco: Never Used  Substance and Sexual Activity  . Alcohol use: No  . Drug use: No  . Sexual activity: Not on file  Other Topics Concern  . Not on file  Social History Narrative   Lives at home with his wife, Keith [place].   Right-handed.   No caffeine use.   Education, 4 yrs college     PHYSICAL EXAM  Vitals:   06/14/17 0919  BP: (!) 150/84  Pulse: 77  Weight: 151 lb (68.5 kg)   Body mass index is 24.37 kg/m.  Generalized: Well developed, in no acute distress , well-groomed Head: normocephalic and atraumatic,. Oropharynx benign  Neck: Supple,  Musculoskeletal: No deformity   Neurological examination   Mentation: Alert.AFT 11. Clock drawing 3/4 MMSE - Mini Mental State Exam 06/14/2017 12/09/2016 09/07/2016  Orientation to time '4 4 4  '$ Orientation to Place '4 5 5  '$ Registration '3 3 3  '$ Attention/ Calculation '2 3 5  '$ Recall '2 1 2  '$ Language- name 2 objects '2 2 2  '$ Language- repeat '1 1 1  '$ Language- follow 3 step command '3 3 3  '$ Language- read & follow direction '1 1 1  '$ Write a sentence '1 1 1  '$ Copy design 0 0 0  Total score '23 24 27      '$ Follows all commands speech and language fluent.   Cranial nerve II-XII: Pupils were equal round reactive to light extraocular movements were full, visual field were full on confrontational test. Facial sensation and strength were normal. hearing was intact to finger rubbing bilaterally. Uvula tongue midline. head turning and shoulder shrug were normal and symmetric.Tongue protrusion into cheek strength was normal. Motor: normal bulk and tone, full strength in the BUE, BLE, Sensory: normal and symmetric to light touch,  Coordination: finger-nose-finger,  heel-to-shin bilaterally, no dysmetria Reflexes: Brachioradialis 2/2, biceps 2/2, triceps 2/2, patellar 2/2, Achilles 2/2, plantar responses were flexor bilaterally. Gait and Station: Rising up from seated position without assistance, normal  stance,  moderate stride, good arm swing, smooth turning, able to perform tiptoe, and heel walking without difficulty. Tandem gait is steady.  No assistive device  DIAGNOSTIC DATA (LABS, IMAGING, TESTING) - I reviewed patient records, labs, notes, testing and imaging myself where available.  Lab Results  Component Value Date   WBC 7.0 06/14/2016   HGB 12.2 (L) 06/14/2016   HCT 36.6 (L) 06/14/2016   MCV 89.3 06/14/2016   PLT 309 06/14/2016      Component Value Date/Time   NA 133 (L) 06/14/2016 0312   K 4.6 06/14/2016 0312   CL 103 06/14/2016 0312   CO2 22 06/14/2016 0312   GLUCOSE 94 06/14/2016 0312   BUN 7 06/14/2016 0312   CREATININE 1.18 06/14/2016 0312   CALCIUM 9.1 06/14/2016 0312   PROT 5.8 (L) 06/04/2016 0212   ALBUMIN 3.1 (L) 06/04/2016 0212   AST 69 (H) 06/04/2016 0212   ALT 44 06/04/2016 0212   ALKPHOS 44 06/04/2016 0212   BILITOT 1.2 06/04/2016 0212   GFRNONAA 58 (L) 06/14/2016 0312   GFRAA >60 06/14/2016 0312    Lab Results  Component Value Date   VITAMINB12 599 07/07/2016   Lab Results  Component Value Date   TSH 0.653 07/07/2016      ASSESSMENT AND PLAN   RAMZY CAPPELLETTI is a 77 y.o. 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  MMSE 23 out of 30.  Last 24/30.  He has had side effects to Aricept in the past Laboratory evaluations showed no treatable etiology, normal TSH, B12, PLAN: Memory score is stable Continue Namenda 10 mg twice daily Recommend some exercise 30 minutes of walking daily Patient is not interested in a research trial Patient is not to be driving Follow-up in 6 months Dennie Bible, [facility], [facility], [facility] [address] [place], [place] [address] [phone]

## 2017-06-14 ENCOUNTER — Ambulatory Visit: Payer: Medicare Other | Admitting: Nurse Practitioner

## 2017-06-14 ENCOUNTER — Encounter: Payer: Self-pay | Admitting: Nurse Practitioner

## 2017-06-14 VITALS — BP 150/84 | HR 77 | Wt 151.0 lb

## 2017-06-14 DIAGNOSIS — F0390 Unspecified dementia without behavioral disturbance: Secondary | ICD-10-CM | POA: Diagnosis not present

## 2017-06-14 NOTE — Patient Instructions (Signed)
Memory score is stable Continue Namenda 10 mg twice daily Recommend some exercise 30 minutes of walking daily Follow-up in 6 months

## 2017-06-14 NOTE — Progress Notes (Signed)
I have reviewed and agreed above plan. 

## 2017-07-08 DIAGNOSIS — M50322 Other cervical disc degeneration at C5-C6 level: Secondary | ICD-10-CM | POA: Diagnosis not present

## 2017-07-08 DIAGNOSIS — M9902 Segmental and somatic dysfunction of thoracic region: Secondary | ICD-10-CM | POA: Diagnosis not present

## 2017-07-08 DIAGNOSIS — M5134 Other intervertebral disc degeneration, thoracic region: Secondary | ICD-10-CM | POA: Diagnosis not present

## 2017-07-08 DIAGNOSIS — M9901 Segmental and somatic dysfunction of cervical region: Secondary | ICD-10-CM | POA: Diagnosis not present

## 2017-07-22 DIAGNOSIS — N183 Chronic kidney disease, stage 3 (moderate): Secondary | ICD-10-CM | POA: Diagnosis not present

## 2017-08-30 DIAGNOSIS — H11423 Conjunctival edema, bilateral: Secondary | ICD-10-CM | POA: Diagnosis not present

## 2017-08-30 DIAGNOSIS — H04123 Dry eye syndrome of bilateral lacrimal glands: Secondary | ICD-10-CM | POA: Diagnosis not present

## 2017-08-30 DIAGNOSIS — H353131 Nonexudative age-related macular degeneration, bilateral, early dry stage: Secondary | ICD-10-CM | POA: Diagnosis not present

## 2017-08-30 DIAGNOSIS — L723 Sebaceous cyst: Secondary | ICD-10-CM | POA: Diagnosis not present

## 2017-08-30 DIAGNOSIS — D485 Neoplasm of uncertain behavior of skin: Secondary | ICD-10-CM | POA: Diagnosis not present

## 2017-08-30 DIAGNOSIS — H18413 Arcus senilis, bilateral: Secondary | ICD-10-CM | POA: Diagnosis not present

## 2017-08-30 DIAGNOSIS — H401131 Primary open-angle glaucoma, bilateral, mild stage: Secondary | ICD-10-CM | POA: Diagnosis not present

## 2017-10-07 DIAGNOSIS — R930 Abnormal findings on diagnostic imaging of skull and head, not elsewhere classified: Secondary | ICD-10-CM | POA: Diagnosis not present

## 2017-10-07 DIAGNOSIS — I6523 Occlusion and stenosis of bilateral carotid arteries: Secondary | ICD-10-CM | POA: Diagnosis not present

## 2017-10-07 DIAGNOSIS — I10 Essential (primary) hypertension: Secondary | ICD-10-CM | POA: Diagnosis not present

## 2017-10-07 DIAGNOSIS — Z9889 Other specified postprocedural states: Secondary | ICD-10-CM | POA: Diagnosis not present

## 2017-10-07 DIAGNOSIS — I779 Disorder of arteries and arterioles, unspecified: Secondary | ICD-10-CM | POA: Diagnosis not present

## 2017-11-19 DIAGNOSIS — N183 Chronic kidney disease, stage 3 (moderate): Secondary | ICD-10-CM | POA: Diagnosis not present

## 2017-11-19 DIAGNOSIS — I10 Essential (primary) hypertension: Secondary | ICD-10-CM | POA: Diagnosis not present

## 2017-11-19 DIAGNOSIS — Z1339 Encounter for screening examination for other mental health and behavioral disorders: Secondary | ICD-10-CM | POA: Diagnosis not present

## 2017-11-19 DIAGNOSIS — I160 Hypertensive urgency: Secondary | ICD-10-CM | POA: Diagnosis not present

## 2017-11-22 DIAGNOSIS — Z6824 Body mass index (BMI) 24.0-24.9, adult: Secondary | ICD-10-CM | POA: Diagnosis not present

## 2017-11-22 DIAGNOSIS — I10 Essential (primary) hypertension: Secondary | ICD-10-CM | POA: Diagnosis not present

## 2017-11-29 DIAGNOSIS — H47233 Glaucomatous optic atrophy, bilateral: Secondary | ICD-10-CM | POA: Diagnosis not present

## 2017-11-29 DIAGNOSIS — H401131 Primary open-angle glaucoma, bilateral, mild stage: Secondary | ICD-10-CM | POA: Diagnosis not present

## 2017-11-29 DIAGNOSIS — H04123 Dry eye syndrome of bilateral lacrimal glands: Secondary | ICD-10-CM | POA: Diagnosis not present

## 2017-11-29 DIAGNOSIS — H0100A Unspecified blepharitis right eye, upper and lower eyelids: Secondary | ICD-10-CM | POA: Diagnosis not present

## 2017-11-29 DIAGNOSIS — H353131 Nonexudative age-related macular degeneration, bilateral, early dry stage: Secondary | ICD-10-CM | POA: Diagnosis not present

## 2017-12-09 NOTE — Progress Notes (Signed)
[facility]  PATIENT: Keith Gibson DOB: 28-Jun-1940   REASON FOR VISIT: Follow-up for memory loss HISTORY FROM: Patient and wife Keith Gibson    HISTORY OF PRESENT ILLNESS:Keith Gibson is a 77 years old right handed male, seen in refer by vascular surgeon Dr. Elam Dutch for evaluation of memory loss, his primary care is  [facility], he is accompanied by his wife. Initial evaluation is on Jul 07 2016.  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  He underwent aorta to superior mesenteric and celiac artery bypass on May 22 2016, his postoperative course was complicated by delirium, UTI.  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  He was noted to have memory loss since 2016, he lost directions easily, he has to stop to think how to get to places.  He is still able to keep books in balance, but began to make some mistakes.  He has lost appetite, had significant weight loss since 2016, 30 Lb over 2 years. He also tends to withdraw, was started on Aricept since summer of 2017.   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 UPDATE 1/28/2019CM Keith [place], 77 year old male returns for follow-up with history of memory loss.  He reports good and bad days.  He is currently on Namenda 10 mg twice a day without side effects.  She was unable to tolerate Aricept.  He is no longer driving.  He is not in the research trial and at this point does not want to be.  He remains independent in ADLs.  Appetite is good and he is sleeping well.  He gets little exercise.  He returns for reevaluation UPDATE 7/29/2019CM Keith [place], 77 year old male returns for follow-up with history of memory loss.  He continues to have good and bad days.  He is no longer driving.  He remains independent in activities of daily living except for episodes of bowel incontinence every couple of weeks but not regularly.IADLs 2/8, he is unable to shop independently repair meals do laundry drive a car dispensing his own medication and handling finances.  He gets little exercise.  Appetite is good and he is sleeping well at night with no wandering behavior.  He  returns for reevaluation.  He has had side effects to Aricept in the past REVIEW OF SYSTEMS: Full 14 system review of systems performed and notable only for those listed, all others are neg:  Constitutional: neg  Cardiovascular: neg Ear/Nose/Throat: neg  Skin: neg Eyes: neg Respiratory: neg Gastroitestinal: neg  Hematology/Lymphatic: neg  Endocrine: neg Musculoskeletal:neg Allergy/Immunology: neg Neurological: Memory loss Psychiatric: Decreased concentration Sleep : neg   ALLERGIES: Allergies  Allergen Reactions  . No Known Allergies     HOME MEDICATIONS: Outpatient Medications Prior to Visit  Medication Sig Dispense Refill  . Artificial Tear Solution (SYSTANE CONTACTS) SOLN Place 1 drop into both eyes 3 (three) times daily.     Marland Kitchen aspirin EC 81 MG tablet Take 81 mg by mouth daily.    Marland Kitchen atorvastatin (LIPITOR) 40 MG tablet TAKE 1 (ONE) TABLET BY MOUTH AT BEDTIME FOR HIGH CHOLESTEROL  0  . brimonidine (ALPHAGAN P) 0.1 % SOLN Place 1 drop into both eyes every 8 (eight) hours.     Marland Kitchen latanoprost (XALATAN) 0.005 % ophthalmic solution Place 1 drop into the left eye at bedtime.    Marland Kitchen lisinopril (PRINIVIL,ZESTRIL) 5 MG tablet Take 5 mg by mouth daily.    . memantine (NAMENDA) 10 MG tablet Take 1 tablet (10 mg total) by mouth 2 (two) times daily. 60 tablet 11  . Multiple Vitamins-Minerals (CENTRUM SILVER 50+MEN PO) Take 1 tablet by mouth daily.    . Multiple Vitamins-Minerals (OCUVITE ADULT 50+) CAPS Take 1 capsule by mouth daily.    . Omega-3 Fatty Acids (FISH OIL) 1200 MG CAPS Take 1,200 mg by mouth daily.     . potassium chloride (K-DUR) 10 MEQ tablet Take 10 mEq by mouth as needed (as needed for leg cramps).     No facility-administered medications prior to visit.     PAST MEDICAL HISTORY: Past Medical History:  Diagnosis Date  . Cancer ([place]) throat /1998   precancerous polyps  . Cataract    bil removed  . Chronic kidney disease    low functioning kidney disease  .  Complication of anesthesia 05/2016   pt was hard to wake up, then when he woke up he became viloent.  Marland Kitchen COPD (chronic obstructive pulmonary disease) ([place])   . Glaucoma   . Hyperlipidemia   . Hypertension   . Memory loss   . 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 Shortness of breath   . Stroke ([place])    2015  . Wears dentures    full top-partial bottom  . Wears glasses   . Wears hearing aid    both ears    PAST SURGICAL HISTORY: Past Surgical History:  Procedure Laterality Date  . 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 APPENDECTOMY  patient denies  . CAROTID ENDARTERECTOMY Right 05/08/2014  . COLONOSCOPY    . EYE SURGERY Bilateral     laser, cataract surgery  . KNEE SURGERY  1990   arthroscopic on right knee  . MESENTERIC ARTERY BYPASS N/A 06/01/2016   Procedure: AORTO SUPERIOR MESENTERIC AND CELIAC ARTERY BYPASS;  Surgeon: Elam Dutch, MD;  Location: [place];  Service: Vascular;  Laterality: N/A;  . PERIPHERAL VASCULAR CATHETERIZATION N/A 04/10/2016   Procedure: Abdominal Aortogram;  Surgeon: Elam Dutch, MD;  Location: [place] [facility];  Service: Cardiovascular;  Laterality: N/A;  . PERIPHERAL VASCULAR CATHETERIZATION Bilateral 04/10/2016   Procedure: Lower Extremity Angiography;  Surgeon: Elam Dutch, MD;  Location: [place] [facility];  Service: Cardiovascular;  Laterality: Bilateral;  . SHOULDER ARTHROSCOPY WITH ROTATOR CUFF REPAIR AND SUBACROMIAL DECOMPRESSION Right 08/30/2012   Procedure: RIGHT SHOULDER ARTHROSCOPY WITH SUBACROMIAL DECOMPRESSION, DISTAL CLAVICLE RESECTION AND ROTATOR CUFF REPAIR;  Surgeon: Cammie Sickle., MD;  Location: [place];  Service: Orthopedics;   Laterality: Right;  . TONSILLECTOMY    . vocal cord surg  1992   removed precancerous polyps    FAMILY HISTORY: Family History  Problem Relation Age of Onset  . Heart Problems Mother   . Other Father        mining accident  . Colon cancer Neg Hx     SOCIAL HISTORY: Social History   Socioeconomic History  . Marital status: Married    Spouse name: Not on file  . Number of children: 4  . Years of education: College  . Highest education level: Not on file  Occupational History  . Occupation: Retired  Scientific laboratory technician  . Financial resource strain: Not on file  . Food insecurity:    Worry: Not on file    Inability: Not on file  . Transportation needs:    Medical: Not on file    Non-medical: Not on file  Tobacco Use  . Smoking status: Former Smoker    Types: Cigarettes    Last attempt to quit: 03/08/1997    Years since quitting: 20.7  . Smokeless tobacco: Never Used  Substance and Sexual Activity  . Alcohol use: No  . Drug use: No  . Sexual activity: Not on file  Lifestyle  . Physical activity:    Days per week: Not on file    Minutes per session: Not on file  . Stress: Not on file  Relationships  . Social connections:    Talks on phone: Not on file    Gets together: Not on file    Attends religious service: Not on file    Active member of club or organization: Not on file    Attends meetings of clubs or organizations: Not on file    Relationship status: Not on file  . Intimate partner violence:    Fear of current or ex partner: Not on file    Emotionally abused: Not on file    Physically abused: Not on file    Forced sexual activity: Not on file  Other Topics Concern  . Not on file  Social History Narrative   Lives at home with his wife, Keith Gibson.   Right-handed.   No caffeine use.   Education, 4 yrs college     PHYSICAL EXAM  Vitals:   12/13/17 1012  BP: 124/80  Pulse: 76  Weight: 145 lb 9.6 oz (66 kg)  Height: _0  (1.676 m)   Body mass index  is  23.5 kg/m.  Generalized: Well developed, in no acute distress , well-groomed Head: normocephalic and atraumatic,. Oropharynx benign  Neck: Supple,  Musculoskeletal: No deformity   Neurological examination   Mentation: Alert.AFT 7. Clock drawing 2/4 MMSE - Mini Mental State Exam 12/13/2017 06/14/2017 12/09/2016  Orientation to time _0 Orientation to Place _1 Registration _2 Attention/ Calculation _3 Recall _4 Language- name 2 objects _5 Language- repeat _6 Language- follow 3 step command _7 Language- read & follow direction _8 Write a sentence _9 Copy design 0 0 0  Total score _10 Follows all commands speech and language fluent.   Cranial nerve II-XII: Pupils were equal round reactive to light extraocular movements were full, visual field were full on confrontational test. Facial sensation and strength were normal. hearing was intact to finger rubbing bilaterally. Uvula tongue midline. head turning and shoulder shrug were normal and symmetric.Tongue protrusion into cheek strength was normal. Motor: normal bulk and tone, full strength in the BUE, BLE, Sensory: normal and symmetric to light touch,  Coordination: finger-nose-finger, heel-to-shin bilaterally, no dysmetria Reflexes: Brachioradialis 2/2, biceps 2/2, triceps 2/2, patellar 2/2, Achilles 2/2, plantar responses were flexor bilaterally. Gait and Station: Rising up from seated position without assistance, normal stance,  moderate stride, good arm swing, smooth turning, able to perform tiptoe, and heel walking without difficulty. Tandem gait is steady.  No assistive device  DIAGNOSTIC DATA (LABS, IMAGING, TESTING) - I reviewed patient records, labs, notes, testing and imaging myself where available.  Lab Results  Component Value Date   WBC 7.0 06/14/2016   HGB 12.2 (L) 06/14/2016   HCT 36.6 (L) 06/14/2016   MCV 89.3 06/14/2016   PLT 309 06/14/2016      Component Value  Date/Time   NA 133 (L) 06/14/2016 0312   K 4.6 06/14/2016 0312   CL 103 06/14/2016 0312   CO2 22 06/14/2016 0312   GLUCOSE 94 06/14/2016 0312   BUN 7 06/14/2016 0312   CREATININE 1.18 06/14/2016 0312   CALCIUM 9.1 06/14/2016 0312   PROT 5.8 (L) 06/04/2016 0212   ALBUMIN 3.1 (L) 06/04/2016 0212   AST 69 (H) 06/04/2016 0212   ALT 44 06/04/2016 0212   ALKPHOS 44 06/04/2016 0212   BILITOT 1.2 06/04/2016 0212   GFRNONAA 58 (L) 06/14/2016 0312   GFRAA >60 06/14/2016 0312    Lab Results  Component Value Date   VITAMINB12 599 07/07/2016   Lab Results  Component Value Date   TSH 0.653 07/07/2016      ASSESSMENT AND PLAN   Keith Gibson is a 77 y.o. 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  MMSE 23 out of 30.  Last 23/30.  He has had side effects to Aricept in the past Laboratory evaluations showed no treatable etiology, normal TSH, B12,  PLAN: Memory score is stable Continue Namenda 10 mg twice daily will refill Recommend some exercise 30 minutes of walking daily Follow-up in 6 months Patient is not interested in research for his memory Dennie Bible, [facility], [facility], [facility] [address] [place], [place] [address] [phone]

## 2017-12-13 ENCOUNTER — Ambulatory Visit: Payer: Medicare Other | Admitting: Nurse Practitioner

## 2017-12-13 ENCOUNTER — Encounter: Payer: Self-pay | Admitting: Nurse Practitioner

## 2017-12-13 VITALS — BP 124/80 | HR 76 | Ht 66.0 in | Wt 145.6 lb

## 2017-12-13 DIAGNOSIS — F0390 Unspecified dementia without behavioral disturbance: Secondary | ICD-10-CM

## 2017-12-13 MED ORDER — MEMANTINE HCL 10 MG PO TABS: 10.0000 mg | ORAL_TABLET | Freq: Two times a day (BID) | ORAL | 11 refills | Status: DC

## 2017-12-13 NOTE — Progress Notes (Signed)
I have reviewed and agreed above plan. 

## 2017-12-13 NOTE — Patient Instructions (Signed)
Memory score is stable Continue Namenda 10 mg twice daily will refill Recommend some exercise 30 minutes of walking daily Follow-up in 6 months

## 2018-02-28 DIAGNOSIS — H35033 Hypertensive retinopathy, bilateral: Secondary | ICD-10-CM | POA: Diagnosis not present

## 2018-02-28 DIAGNOSIS — H353131 Nonexudative age-related macular degeneration, bilateral, early dry stage: Secondary | ICD-10-CM | POA: Diagnosis not present

## 2018-02-28 DIAGNOSIS — H0102A Squamous blepharitis right eye, upper and lower eyelids: Secondary | ICD-10-CM | POA: Diagnosis not present

## 2018-02-28 DIAGNOSIS — Z23 Encounter for immunization: Secondary | ICD-10-CM | POA: Diagnosis not present

## 2018-02-28 DIAGNOSIS — H401131 Primary open-angle glaucoma, bilateral, mild stage: Secondary | ICD-10-CM | POA: Diagnosis not present

## 2018-02-28 DIAGNOSIS — H47233 Glaucomatous optic atrophy, bilateral: Secondary | ICD-10-CM | POA: Diagnosis not present

## 2018-02-28 DIAGNOSIS — Z6824 Body mass index (BMI) 24.0-24.9, adult: Secondary | ICD-10-CM | POA: Diagnosis not present

## 2018-03-04 DIAGNOSIS — T50905A Adverse effect of unspecified drugs, medicaments and biological substances, initial encounter: Secondary | ICD-10-CM | POA: Diagnosis not present

## 2018-03-04 DIAGNOSIS — Z6823 Body mass index (BMI) 23.0-23.9, adult: Secondary | ICD-10-CM | POA: Diagnosis not present

## 2018-03-04 DIAGNOSIS — G3184 Mild cognitive impairment, so stated: Secondary | ICD-10-CM | POA: Diagnosis not present

## 2018-03-07 ENCOUNTER — Telehealth: Payer: Self-pay | Admitting: Nurse Practitioner

## 2018-03-07 NOTE — Telephone Encounter (Signed)
Worsening hallucinations,  (stories, seeing people in home, not knowing wife).  He has seen pcp x 2 since this started 3-4 wks, recommending to see [place].  Made appt 03/09/18 at 1415.  She was appreciative.

## 2018-03-07 NOTE — Telephone Encounter (Addendum)
Pts wife [name](on DPR) requesting a call stating that the pts pcp would like the pt to be seen within the next few weeks(with MD) due to ongoing symptoms. Did not wish to discuss further with me. Please call to advise

## 2018-03-08 NOTE — Progress Notes (Signed)
[facility]  PATIENT: Keith Gibson DOB: 02-22-1941   REASON FOR VISIT: Follow-up for memory loss HISTORY FROM: Patient and wife Keith Gibson and 2 sons    HISTORY OF PRESENT ILLNESS:Keith Gibson is a 77 years old right handed male, seen in refer by vascular surgeon Dr. Elam Dutch for evaluation of memory loss, his primary care is  [facility], he is accompanied by his wife. Initial evaluation is on Jul 07 2016.  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  He underwent aorta to superior mesenteric and celiac artery bypass on May 22 2016, his postoperative course was complicated by delirium, UTI.  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  He was noted to have memory loss since 2016, he lost directions easily, he has to stop to think how to get to places.  He is still able to keep books in balance, but began to make some mistakes.  He has lost appetite, had significant weight loss since 2016, 30 Lb over 2 years. He also tends to withdraw, was started on Aricept since summer of 2017.   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 UPDATE 1/28/2019CM Keith Gibson, 77 year old male returns for follow-up with history of memory loss.  He reports good and bad days.  He is currently on Namenda 10 mg twice a day without side effects.  She was unable to tolerate Aricept.  He is no longer driving.  He is not in the research trial and at this point does not want to be.  He remains independent in ADLs.  Appetite is good and he is sleeping well.  He gets little exercise.  He returns for reevaluation UPDATE 7/29/2019CM Keith Gibson, 77 year old male returns for follow-up with history  of memory loss.  He continues to have good and bad days.  He is no longer driving.  He remains independent in activities of daily living except for episodes of bowel incontinence every couple of weeks but not regularly.IADLs 2/8, he is unable to shop independently repair meals do laundry drive a car dispensing his own medication and handling finances.  He gets little exercise.  Appetite is good and he is sleeping well at night with no wandering  behavior.  He returns for reevaluation.  He has had side effects to Aricept in the past UPDATE 10/23/2019CM Keith Gibson, 77 year old male returns for follow-up with a history of memory loss.  Since last seen he has been having hallucinations for about the last month which is new for him.  He has had a urine check at primary care and it was negative according to wife.  He was placed on Abilify by primary care which caused a recent fall so that was stopped.  He continues to have good and bad days he no longer drives.  He continues to be independent with activities of daily living except for occasional episodes of bowel incontinence.  He is unable to shop independently or dispense his medications he gets little exercise.  He has lost some weight and is taking Ensure.  Sometimes he sleeps during the day so he does not sleep at night.  He has had side effects to Aricept in the past.  He returns for reevaluation REVIEW OF SYSTEMS: Full 14 system review of systems performed and notable only for those listed, all others are neg:  Constitutional: neg  Cardiovascular: neg Ear/Nose/Throat: neg  Skin: neg Eyes: neg Respiratory: neg Gastroitestinal: neg  Hematology/Lymphatic: neg  Endocrine: neg Musculoskeletal:neg Allergy/Immunology: neg Neurological: Memory loss Psychiatric: Decreased concentration, hallucinations Sleep : neg   ALLERGIES: Allergies  Allergen Reactions  . No Known Allergies     HOME MEDICATIONS: Outpatient Medications Prior to Visit  Medication Sig Dispense Refill  . Artificial Tear Solution (SYSTANE CONTACTS) SOLN Place 1 drop into both eyes 3 (three) times daily.     Marland Kitchen aspirin EC 81 MG tablet Take 81 mg by mouth daily.    Marland Kitchen atorvastatin (LIPITOR) 40 MG tablet TAKE 1 (ONE) TABLET BY MOUTH AT BEDTIME FOR HIGH CHOLESTEROL  0  . brimonidine (ALPHAGAN P) 0.1 % SOLN Place 1 drop into both eyes every 8 (eight) hours.     Marland Kitchen latanoprost (XALATAN) 0.005 % ophthalmic solution Place 1 drop  into the left eye at bedtime.    Marland Kitchen lisinopril (PRINIVIL,ZESTRIL) 5 MG tablet Take 5 mg by mouth daily.    . memantine (NAMENDA) 10 MG tablet Take 1 tablet (10 mg total) by mouth 2 (two) times daily. 60 tablet 11  . Multiple Vitamins-Minerals (CENTRUM SILVER 50+MEN PO) Take 1 tablet by mouth daily.    . Multiple Vitamins-Minerals (OCUVITE ADULT 50+) CAPS Take 1 capsule by mouth daily.    . Omega-3 Fatty Acids (FISH OIL) 1200 MG CAPS Take 1,200 mg by mouth daily.     . potassium chloride (K-DUR) 10 MEQ tablet Take 10 mEq by mouth as needed (as needed for leg cramps).    . QUEtiapine (SEROQUEL) 25 MG tablet Take 25 mg by mouth at bedtime.     No facility-administered medications prior to visit.     PAST MEDICAL HISTORY: Past Medical History:  Diagnosis Date  . Cancer ([place]) throat /1998   precancerous polyps  . Cataract    bil removed  .  Chronic kidney disease    low functioning kidney disease  . Complication of anesthesia 05/2016   pt was hard to wake up, then when he woke up he became viloent.  Marland Kitchen COPD (chronic obstructive pulmonary disease) ([place])   . Glaucoma   . Hyperlipidemia   . Hypertension   . Memory loss   . 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 Shortness of breath   . Stroke ([place])    2015  . Wears dentures    full top-partial bottom  . Wears glasses   . Wears hearing aid    both ears    PAST SURGICAL HISTORY: Past Surgical History:  Procedure Laterality Date  . 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 APPENDECTOMY     patient denies  . CAROTID ENDARTERECTOMY Right 05/08/2014  . COLONOSCOPY    . EYE SURGERY Bilateral     laser, cataract surgery  . KNEE SURGERY  1990   arthroscopic on right knee  .  MESENTERIC ARTERY BYPASS N/A 06/01/2016   Procedure: AORTO SUPERIOR MESENTERIC AND CELIAC ARTERY BYPASS;  Surgeon: Elam Dutch, MD;  Location: [place];  Service: Vascular;  Laterality: N/A;  . PERIPHERAL VASCULAR CATHETERIZATION N/A 04/10/2016   Procedure: Abdominal Aortogram;  Surgeon: Elam Dutch, MD;  Location: [place] [facility];  Service: Cardiovascular;  Laterality: N/A;  . PERIPHERAL VASCULAR CATHETERIZATION Bilateral 04/10/2016   Procedure: Lower Extremity Angiography;  Surgeon: Elam Dutch, MD;  Location: [place] [facility];  Service: Cardiovascular;  Laterality: Bilateral;  . SHOULDER ARTHROSCOPY WITH ROTATOR CUFF REPAIR AND SUBACROMIAL DECOMPRESSION Right 08/30/2012   Procedure: RIGHT SHOULDER ARTHROSCOPY WITH SUBACROMIAL DECOMPRESSION, DISTAL CLAVICLE RESECTION AND ROTATOR CUFF REPAIR;  Surgeon: Cammie Sickle., MD;  Location: [place];  Service: Orthopedics;  Laterality: Right;  . TONSILLECTOMY    . vocal cord surg  1992   removed precancerous polyps    FAMILY HISTORY: Family History  Problem Relation Age of Onset  . Heart Problems Mother   . Other Father        mining accident  . Colon cancer Neg Hx     SOCIAL HISTORY: Social History   Socioeconomic History  . Marital status: Married    Spouse name: Not on file  . Number of children: 4  . Years of education: College  . Highest education level: Not on file  Occupational History  . Occupation: Retired  Scientific laboratory technician  . Financial resource strain: Not on file  . Food insecurity:    Worry: Not on file    Inability: Not on file  . Transportation needs:    Medical: Not on file    Non-medical: Not on file  Tobacco Use  . Smoking status: Former Smoker    Types: Cigarettes    Last attempt to quit: 03/08/1997    Years since quitting: 21.0  . Smokeless tobacco: Never Used  Substance and Sexual Activity  . Alcohol use: No  . Drug use: No  . Sexual activity: Not on file  Lifestyle  .  Physical activity:    Days per week: Not on file    Minutes per session: Not on file  . Stress: Not on file  Relationships  . Social connections:    Talks on phone: Not on file    Gets together: Not on file    Attends religious service: Not on file    Active member of club or organization: Not on file    Attends meetings of clubs or organizations: Not on file    Relationship status: Not on file  . Intimate partner violence:    Fear of current or ex partner: Not on file    Emotionally abused: Not on file    Physically abused: Not on file    Forced sexual activity: Not on file  Other Topics Concern  . Not on file  Social History Narrative   Lives at home with his wife, Keith Gibson.   Right-handed.   No caffeine use.   Education, 4 yrs college     PHYSICAL EXAM  Vitals:   03/09/18 1416  BP: (!) 145/79  Pulse: 78  Weight: 142 lb 6.4 oz (64.6 kg)  Height: _0  (1.676 m)   Body mass index is 22.98 kg/m.  Generalized: Well developed, in no acute distress , well-groomed Head: normocephalic and atraumatic,. Oropharynx benign  Neck: Supple,  Musculoskeletal: No deformity   Neurological examination   Mentation: Alert.. Clock drawing 2/4 MMSE - Mini Mental State Exam 03/09/2018 12/13/2017 06/14/2017  Not completed: (No Data) - -  Orientation to time _1 Orientation to Place _2 Registration _3 Attention/ Calculation _4 Recall _5 Language- name 2 objects _6 Language- repeat _7 Language- follow 3 step command _8 Language- read & follow direction _9 Write a sentence 0 1 1  Copy design 0 0 0  Total score _10 Follows all commands speech and language fluent.   Cranial nerve II-XII: Pupils were equal round reactive to light extraocular movements were full, visual field were full on confrontational test. Facial sensation and strength were normal. hearing was intact to finger rubbing bilaterally. Uvula tongue midline. head turning and  shoulder shrug were normal and symmetric.Tongue protrusion into cheek strength was normal. Motor: normal bulk and tone, full strength in the BUE, BLE, Sensory: normal and symmetric to light touch,  Coordination: finger-nose-finger, heel-to-shin bilaterally, no dysmetria Reflexes: Symmetric upper and lower, plantar responses were flexor bilaterally. Gait and Station: Rising up from seated position without assistance, normal stance,  moderate stride, good arm swing, smooth turning, able to perform tiptoe, and heel walking without difficulty. Tandem gait is unsteady.  No assistive device  DIAGNOSTIC DATA (LABS, IMAGING, TESTING) - I reviewed patient records, labs, notes, testing and imaging myself where available.  Lab Results  Component Value Date   WBC 7.0 06/14/2016   HGB 12.2 (L) 06/14/2016   HCT 36.6 (L) 06/14/2016   MCV 89.3 06/14/2016   PLT 309 06/14/2016      Component Value Date/Time   NA 133 (L) 06/14/2016 0312   K 4.6 06/14/2016 0312   CL 103 06/14/2016 0312   CO2 22 06/14/2016 0312   GLUCOSE 94 06/14/2016 0312   BUN 7 06/14/2016 0312   CREATININE 1.18 06/14/2016 0312   CALCIUM 9.1 06/14/2016 0312   PROT 5.8 (L) 06/04/2016 0212   ALBUMIN 3.1 (L) 06/04/2016 0212   AST 69 (H) 06/04/2016 0212   ALT 44 06/04/2016 0212   ALKPHOS 44 06/04/2016 0212   BILITOT 1.2 06/04/2016  [place] (L) 06/14/2016 0312   GFRAA >60 06/14/2016 0312    Lab Results  Component Value Date   OHFGBMSX11 552 07/07/2016   Lab Results  Component Value Date   TSH 0.653 07/07/2016      ASSESSMENT AND PLAN  ARGIL MAHL is a 77 y.o. 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  MMSE 21 out of 30.  Last 23/30.  He has had side effects to Aricept in the past Laboratory evaluations showed no treatable etiology, normal TSH, B12, patient is now having hallucinations.  He was checked at primary care for urinary tract infection which  was negative according to the wife.  PLAN:  Continue Namenda 10 mg twice daily  Recommend some exercise 30 minutes of walking daily Seroquel 61m 1 hour prior to bed Keep same waking and sleep time Avoid sleeping during the day Follow-up as planned [facility] [address] [place] [phone]

## 2018-03-09 ENCOUNTER — Ambulatory Visit: Payer: Medicare Other | Admitting: Nurse Practitioner

## 2018-03-09 ENCOUNTER — Encounter: Payer: Self-pay | Admitting: Nurse Practitioner

## 2018-03-09 VITALS — BP 145/79 | HR 78 | Ht 66.0 in | Wt 142.4 lb

## 2018-03-09 DIAGNOSIS — R443 Hallucinations, unspecified: Secondary | ICD-10-CM | POA: Insufficient documentation

## 2018-03-09 DIAGNOSIS — F0390 Unspecified dementia without behavioral disturbance: Secondary | ICD-10-CM | POA: Diagnosis not present

## 2018-03-09 MED ORDER — QUETIAPINE FUMARATE 25 MG PO TABS: 25.0000 mg | ORAL_TABLET | Freq: Every day | ORAL | 6 refills | Status: DC

## 2018-03-09 NOTE — Patient Instructions (Signed)
Memory score is stable Continue Namenda 10 mg twice daily will refill Recommend some exercise 30 minutes of walking daily Seroquel 25mg  1 hour prior to bed Keep same waking and sleep time Avoid sleeping during the day Follow-up as planned

## 2018-03-14 NOTE — Progress Notes (Signed)
I have reviewed and agreed above plan. 

## 2018-03-15 ENCOUNTER — Telehealth: Payer: Self-pay | Admitting: Nurse Practitioner

## 2018-03-15 MED ORDER — QUETIAPINE FUMARATE 25 MG PO TABS: 25.0000 mg | ORAL_TABLET | Freq: Two times a day (BID) | ORAL | 6 refills | Status: DC

## 2018-03-15 NOTE — Telephone Encounter (Signed)
Pt wife(on DPR-Tamez,[name]) has called asking if the QUEtiapine (SEROQUEL) 25 MG tablet can be slightly increased, wife states current dosage is now like water, not helping any longer.  [id number] - [place], [place] [phone] (Phone) [phone] (Fax)

## 2018-03-15 NOTE — Addendum Note (Signed)
Addended by: Otilio Jefferson on: 03/15/2018 03:32 PM   Modules accepted: Orders

## 2018-03-15 NOTE — Telephone Encounter (Signed)
Spoke to pts wife. Taking seroquel 25mg  po qhs, seemed to help the first 2 nights of sleeping, but after that he has been up and down at night, hallucinating, no change with that. She is asking if dose can be increased.  Please advise.

## 2018-03-15 NOTE — Telephone Encounter (Signed)
Spoke to wife and can increase to 25mg  po twice daily.  He may be sleepy, hopefully this may be adjustment to medication and will be less so after on for a bit.  She verbalized understanding.

## 2018-03-15 NOTE — Telephone Encounter (Addendum)
LMVM for pt that returned call. Pt last seen 03/09/2018 with you.

## 2018-03-15 NOTE — Telephone Encounter (Signed)
Yes it  can be increased to twice daily. RX to local pharmacy. Please call wife

## 2018-04-10 ENCOUNTER — Other Ambulatory Visit: Payer: Self-pay

## 2018-04-10 ENCOUNTER — Observation Stay (HOSPITAL_COMMUNITY)
Admission: EM | Admit: 2018-04-10 | Discharge: 2018-04-12 | Disposition: A | Discharge status: Home Health | Payer: Medicare Other | Attending: Internal Medicine | Admitting: Internal Medicine

## 2018-04-10 ENCOUNTER — Observation Stay (HOSPITAL_COMMUNITY): Payer: Medicare Other

## 2018-04-10 ENCOUNTER — Encounter (HOSPITAL_COMMUNITY): Payer: Self-pay | Admitting: Anesthesiology

## 2018-04-10 ENCOUNTER — Emergency Department (HOSPITAL_COMMUNITY): Payer: Medicare Other

## 2018-04-10 DIAGNOSIS — Z79899 Other long term (current) drug therapy: Secondary | ICD-10-CM | POA: Insufficient documentation

## 2018-04-10 DIAGNOSIS — N179 Acute kidney failure, unspecified: Secondary | ICD-10-CM | POA: Diagnosis not present

## 2018-04-10 DIAGNOSIS — Z7982 Long term (current) use of aspirin: Secondary | ICD-10-CM | POA: Diagnosis not present

## 2018-04-10 DIAGNOSIS — N19 Unspecified kidney failure: Secondary | ICD-10-CM | POA: Diagnosis not present

## 2018-04-10 DIAGNOSIS — R443 Hallucinations, unspecified: Secondary | ICD-10-CM | POA: Diagnosis not present

## 2018-04-10 DIAGNOSIS — F0390 Unspecified dementia without behavioral disturbance: Secondary | ICD-10-CM | POA: Diagnosis not present

## 2018-04-10 DIAGNOSIS — E860 Dehydration: Secondary | ICD-10-CM

## 2018-04-10 DIAGNOSIS — S32020A Wedge compression fracture of second lumbar vertebra, initial encounter for closed fracture: Secondary | ICD-10-CM | POA: Diagnosis not present

## 2018-04-10 DIAGNOSIS — Z87891 Personal history of nicotine dependence: Secondary | ICD-10-CM | POA: Diagnosis not present

## 2018-04-10 DIAGNOSIS — Z8673 Personal history of transient ischemic attack (TIA), and cerebral infarction without residual deficits: Secondary | ICD-10-CM | POA: Insufficient documentation

## 2018-04-10 DIAGNOSIS — R42 Dizziness and giddiness: Secondary | ICD-10-CM | POA: Diagnosis not present

## 2018-04-10 DIAGNOSIS — Z89421 Acquired absence of other right toe(s): Secondary | ICD-10-CM | POA: Insufficient documentation

## 2018-04-10 DIAGNOSIS — N183 Chronic kidney disease, stage 3 (moderate): Secondary | ICD-10-CM | POA: Diagnosis not present

## 2018-04-10 DIAGNOSIS — N2889 Other specified disorders of kidney and ureter: Secondary | ICD-10-CM | POA: Insufficient documentation

## 2018-04-10 DIAGNOSIS — S32029A Unspecified fracture of second lumbar vertebra, initial encounter for closed fracture: Secondary | ICD-10-CM | POA: Insufficient documentation

## 2018-04-10 DIAGNOSIS — E785 Hyperlipidemia, unspecified: Secondary | ICD-10-CM | POA: Diagnosis not present

## 2018-04-10 DIAGNOSIS — H409 Unspecified glaucoma: Secondary | ICD-10-CM | POA: Insufficient documentation

## 2018-04-10 DIAGNOSIS — W19XXXA Unspecified fall, initial encounter: Secondary | ICD-10-CM | POA: Insufficient documentation

## 2018-04-10 DIAGNOSIS — J9811 Atelectasis: Secondary | ICD-10-CM | POA: Diagnosis not present

## 2018-04-10 DIAGNOSIS — R296 Repeated falls: Secondary | ICD-10-CM | POA: Diagnosis not present

## 2018-04-10 DIAGNOSIS — M5489 Other dorsalgia: Secondary | ICD-10-CM | POA: Diagnosis not present

## 2018-04-10 DIAGNOSIS — M545 Low back pain: Secondary | ICD-10-CM | POA: Diagnosis not present

## 2018-04-10 DIAGNOSIS — I959 Hypotension, unspecified: Secondary | ICD-10-CM | POA: Diagnosis not present

## 2018-04-10 DIAGNOSIS — R52 Pain, unspecified: Secondary | ICD-10-CM | POA: Diagnosis not present

## 2018-04-10 DIAGNOSIS — I129 Hypertensive chronic kidney disease with stage 1 through stage 4 chronic kidney disease, or unspecified chronic kidney disease: Secondary | ICD-10-CM | POA: Insufficient documentation

## 2018-04-10 DIAGNOSIS — J449 Chronic obstructive pulmonary disease, unspecified: Secondary | ICD-10-CM | POA: Diagnosis not present

## 2018-04-10 DIAGNOSIS — E875 Hyperkalemia: Secondary | ICD-10-CM | POA: Insufficient documentation

## 2018-04-10 LAB — URINALYSIS, ROUTINE W REFLEX MICROSCOPIC
Bilirubin Urine: NEGATIVE
Glucose, UA: NEGATIVE mg/dL
KETONES UR: NEGATIVE mg/dL
Leukocytes, UA: NEGATIVE
Nitrite: NEGATIVE
PROTEIN: NEGATIVE mg/dL
Specific Gravity, Urine: 1.013 (ref 1.005–1.030)
pH: 5 (ref 5.0–8.0)

## 2018-04-10 LAB — CBC WITH DIFFERENTIAL/PLATELET
ABS IMMATURE GRANULOCYTES: 0.06 10*3/uL (ref 0.00–0.07)
BASOS PCT: 1 %
Basophils Absolute: 0 10*3/uL (ref 0.0–0.1)
EOS ABS: 0.1 10*3/uL (ref 0.0–0.5)
Eosinophils Relative: 1 %
HCT: 39.8 % (ref 39.0–52.0)
HEMOGLOBIN: 12.4 g/dL — AB (ref 13.0–17.0)
IMMATURE GRANULOCYTES: 1 %
LYMPHS ABS: 1 10*3/uL (ref 0.7–4.0)
Lymphocytes Relative: 13 %
MCH: 30.4 pg (ref 26.0–34.0)
MCHC: 31.2 g/dL (ref 30.0–36.0)
MCV: 97.5 fL (ref 80.0–100.0)
MONO ABS: 0.5 10*3/uL (ref 0.1–1.0)
MONOS PCT: 6 %
Neutro Abs: 6.3 10*3/uL (ref 1.7–7.7)
Neutrophils Relative %: 78 %
Platelets: 155 10*3/uL (ref 150–400)
RBC: 4.08 MIL/uL — ABNORMAL LOW (ref 4.22–5.81)
RDW: 12.8 % (ref 11.5–15.5)
WBC: 8 10*3/uL (ref 4.0–10.5)
nRBC: 0 % (ref 0.0–0.2)

## 2018-04-10 LAB — COMPREHENSIVE METABOLIC PANEL
ALBUMIN: 3.5 g/dL (ref 3.5–5.0)
ALK PHOS: 63 U/L (ref 38–126)
ALT: 30 U/L (ref 0–44)
AST: 28 U/L (ref 15–41)
Anion gap: 8 (ref 5–15)
BUN: 33 mg/dL — AB (ref 8–23)
CALCIUM: 8.6 mg/dL — AB (ref 8.9–10.3)
CO2: 21 mmol/L — AB (ref 22–32)
Chloride: 109 mmol/L (ref 98–111)
Creatinine, Ser: 2.02 mg/dL — ABNORMAL HIGH (ref 0.61–1.24)
GFR calc Af Amer: 35 mL/min — ABNORMAL LOW (ref >60)
GFR calc non Af Amer: 30 mL/min — ABNORMAL LOW (ref >60)
GLUCOSE: 125 mg/dL — AB (ref 70–99)
POTASSIUM: 5.2 mmol/L — AB (ref 3.5–5.1)
SODIUM: 138 mmol/L (ref 135–145)
Total Bilirubin: 0.8 mg/dL (ref 0.3–1.2)
Total Protein: 5.7 g/dL — ABNORMAL LOW (ref 6.5–8.1)

## 2018-04-10 MED ORDER — SODIUM POLYSTYRENE SULFONATE 15 GM/60ML PO SUSP
15.0000 g | Freq: Once | ORAL | Status: AC
  Administered 2018-04-10: 15 g via ORAL
  Filled 2018-04-10: qty 60

## 2018-04-10 MED ORDER — ORAL CARE MOUTH RINSE
15.0000 mL | Freq: Two times a day (BID) | OROMUCOSAL | Status: DC
  Administered 2018-04-10 – 2018-04-12 (×4): 15 mL via OROMUCOSAL

## 2018-04-10 MED ORDER — ADULT MULTIVITAMIN W/MINERALS CH
ORAL_TABLET | Freq: Every day | ORAL | Status: DC
  Administered 2018-04-12: 1 via ORAL
  Filled 2018-04-10 (×2): qty 1

## 2018-04-10 MED ORDER — SODIUM CHLORIDE 0.9 % IV BOLUS
500.0000 mL | Freq: Once | INTRAVENOUS | Status: AC
  Administered 2018-04-10: 500 mL via INTRAVENOUS

## 2018-04-10 MED ORDER — HYDROCODONE-ACETAMINOPHEN 5-325 MG PO TABS
1.0000 | ORAL_TABLET | Freq: Four times a day (QID) | ORAL | Status: DC | PRN
  Administered 2018-04-10 – 2018-04-11 (×2): 1 via ORAL
  Filled 2018-04-10 (×2): qty 1

## 2018-04-10 MED ORDER — HALOPERIDOL LACTATE 5 MG/ML IJ SOLN
5.0000 mg | Freq: Once | INTRAMUSCULAR | Status: AC
  Administered 2018-04-10: 5 mg via INTRAVENOUS
  Filled 2018-04-10: qty 1

## 2018-04-10 MED ORDER — ATORVASTATIN CALCIUM 40 MG PO TABS
40.0000 mg | ORAL_TABLET | Freq: Every day | ORAL | Status: DC
  Administered 2018-04-10 – 2018-04-11 (×2): 40 mg via ORAL
  Filled 2018-04-10 (×2): qty 1

## 2018-04-10 MED ORDER — ENSURE ENLIVE PO LIQD
237.0000 mL | Freq: Two times a day (BID) | ORAL | Status: DC
  Administered 2018-04-11: 237 mL via ORAL

## 2018-04-10 MED ORDER — ASPIRIN EC 81 MG PO TBEC
81.0000 mg | DELAYED_RELEASE_TABLET | Freq: Every day | ORAL | Status: DC
  Administered 2018-04-12: 81 mg via ORAL
  Filled 2018-04-10 (×2): qty 1

## 2018-04-10 MED ORDER — MEMANTINE HCL 10 MG PO TABS
10.0000 mg | ORAL_TABLET | Freq: Two times a day (BID) | ORAL | Status: DC
  Administered 2018-04-10 – 2018-04-12 (×3): 10 mg via ORAL
  Filled 2018-04-10 (×4): qty 1

## 2018-04-10 MED ORDER — SODIUM CHLORIDE 0.9 % IV SOLN
INTRAVENOUS | Status: DC
  Administered 2018-04-10 – 2018-04-11 (×2): via INTRAVENOUS

## 2018-04-10 MED ORDER — QUETIAPINE FUMARATE 25 MG PO TABS
25.0000 mg | ORAL_TABLET | Freq: Two times a day (BID) | ORAL | Status: DC
  Administered 2018-04-10 – 2018-04-12 (×3): 25 mg via ORAL
  Filled 2018-04-10 (×4): qty 1

## 2018-04-10 MED ORDER — ENOXAPARIN SODIUM 30 MG/0.3ML ~~LOC~~ SOLN
30.0000 mg | SUBCUTANEOUS | Status: DC
  Administered 2018-04-10 – 2018-04-11 (×2): 30 mg via SUBCUTANEOUS
  Filled 2018-04-10 (×2): qty 0.3

## 2018-04-10 NOTE — ED Triage Notes (Signed)
Pt to ED from home, ambulatory to kitchen. Pt got dizzy, fell, no LOC. Per EMS pt pale, bp 70 palp no radial pulse. 600 NS given PTA. A/O x4 on arrival. Pt not on blood thinners. C/o lower back pain.

## 2018-04-10 NOTE — Evaluation (Signed)
Physical Therapy Evaluation Patient Details Name: Keith Gibson MRN: [id number] DOB: 12-09-1940 Today's Date: 04/10/2018   History of Present Illness  Pt is a 77 y.o. M with significant PMH of COPD with prior tobacco use, hyperlipidemia, hypertension, dementia who presents with generalized weakness and a fall. In ED was initially hypotensive and orthostatic.   Clinical Impression  Pt admitted with above diagnosis. Pt currently with functional limitations due to the deficits listed below (see PT Problem List). Prior to admission, patient independent with mobility with no assistive device with increased frequency of falls recently. Currently presenting with decreased functional mobility secondary to balance impairments and back pain. Also with positive orthostatics (see vitals flowsheet), but patient asymptomatic. Patient requiring up to minimal assistance for functional mobility. Improved balance, pain control and independence with use walker for room ambulation. Of note, patient does have new low back pain in the setting of a fall. Discussed with RN who paged MD. Recommended use of walker for all mobility at discharge and pt wife verbalized understanding. Pt will benefit from skilled PT to increase their independence and safety with mobility to allow discharge to the venue listed below.       Follow Up Recommendations Home health PT;Supervision for mobility/OOB    Equipment Recommendations  None recommended by PT    Recommendations for Other Services       Precautions / Restrictions Precautions Precautions: Fall Precaution Comments: watch BP Restrictions Weight Bearing Restrictions: No      Mobility  Bed Mobility Overal bed mobility: Needs Assistance Bed Mobility: Supine to Sit;Sit to Supine     Supine to sit: Min assist        Transfers Overall transfer level: Needs assistance Equipment used: Rolling walker (2 wheeled);None Transfers: Sit to/from Stand Sit to Stand: Min  assist         General transfer comment: Min assist to for lift  Ambulation/Gait Ambulation/Gait assistance: Min guard;Min assist Gait Distance (Feet): 15 Feet Assistive device: Rolling walker (2 wheeled);None Gait Pattern/deviations: Step-through pattern;Decreased stride length;Trunk flexed Gait velocity: decreased   General Gait Details: Initially trialed several steps without walker but patient reaching for external support and verbalizing increased back pain. Increased steadiness and improved pain control with use of walker  Stairs            Wheelchair Mobility    Modified Rankin (Stroke Patients Only)       Balance Overall balance assessment: Needs assistance Sitting-balance support: Feet supported Sitting balance-Leahy Scale: Good     Standing balance support: No upper extremity supported;During functional activity Standing balance-Leahy Scale: Fair                               Pertinent Vitals/Pain Pain Assessment: Faces Faces Pain Scale: Hurts even more Pain Location: Low back pain with movement Pain Descriptors / Indicators: Sharp;Grimacing Pain Intervention(s): Monitored during session    Home Living Family/patient expects to be discharged to:: Private residence Living Arrangements: Spouse/significant other   Type of Home: House Home Access: Stairs to enter Entrance Stairs-Rails: Psychiatric nurse of Steps: 4 Home Layout: One level Home Equipment: Environmental consultant - 2 wheels;Cane - single point      Prior Function Level of Independence: Needs assistance   Gait / Transfers Assistance Needed: independent with ambulation  ADL's / Homemaking Assistance Needed: wife assists with IADL's        Hand Dominance  Extremity/Trunk Assessment   Upper Extremity Assessment Upper Extremity Assessment: Overall WFL for tasks assessed    Lower Extremity Assessment Lower Extremity Assessment: Overall WFL for tasks  assessed       Communication   Communication: HOH  Cognition Arousal/Alertness: Awake/alert Behavior During Therapy: WFL for tasks assessed/performed Overall Cognitive Status: History of cognitive impairments - at baseline                                 General Comments: History of dementia. Pt pleasantly confused. Able to recognize family members. Follows all simple commands      General Comments      Exercises     Assessment/Plan    PT Assessment Patient needs continued PT services  PT Problem List Decreased activity tolerance;Decreased balance;Decreased mobility;Pain       PT Treatment Interventions DME instruction;Stair training;Gait training;Functional mobility training;Therapeutic activities;Therapeutic exercise;Balance training;Patient/family education    PT Goals (Current goals can be found in the Care Plan section)  Acute Rehab PT Goals Patient Stated Goal: "no back pain." PT Goal Formulation: With patient Time For Goal Achievement: 04/24/18 Potential to Achieve Goals: Good    Frequency Min 3X/week   Barriers to discharge        Co-evaluation               AM-PAC PT "6 Clicks" Mobility  Outcome Measure Help needed turning from your back to your side while in a flat bed without using bedrails?: None Help needed moving from lying on your back to sitting on the side of a flat bed without using bedrails?: A Little Help needed moving to and from a bed to a chair (including a wheelchair)?: A Little Help needed standing up from a chair using your arms (e.g., wheelchair or bedside chair)?: A Little Help needed to walk in hospital room?: A Little Help needed climbing 3-5 steps with a railing? : A Little 6 Click Score: 19    End of Session Equipment Utilized During Treatment: Gait belt Activity Tolerance: Patient tolerated treatment well Patient left: in bed;with call bell/phone within reach;with family/visitor present Nurse Communication:  Mobility status;Other (comment)(orthostatic vital signs, back pain) PT Visit Diagnosis: Unsteadiness on feet (R26.81);Pain;Difficulty in walking, not elsewhere classified (R26.2) Pain - part of body: (back)    Time: 0165-5374 PT Time Calculation (min) (ACUTE ONLY): 26 min   Charges:   PT Evaluation $PT Eval Moderate Complexity: 1 Mod PT Treatments $Therapeutic Activity: 8-22 mins       Ellamae Sia, PT, DPT Acute Rehabilitation Services Pager [phone] Office [phone]  Willy Eddy 04/10/2018, 4:57 PM

## 2018-04-10 NOTE — H&P (Signed)
History and Physical    VU LIEBMAN VEH:[id number] DOB: [birth year] DOA: 04/10/2018  I have briefly reviewed the patient's prior medical records in [place]  PCP: Practice, [facility]  Patient coming from: home  Chief Complaint: fall  HPI: Keith Gibson is a 77 y.o. male with medical history significant of COPD with prior tobacco use, hyperlipidemia, hypertension, dementia who presents to the hospital brought by family due to generalized weakness and having a fall today.  Patient states that his legs just gave out and he had a fall, denies loss of consciousness, denies any chest pain or shortness of breath, denies any palpitations.  He does not recall hitting his head.  Wife who is at bedside tells me that he has been having multiple recent falls but usually gets up on his own.  Most recent fall was about a week ago when he fell twice.  This time around he was so weak and was unable to get up and he was brought to the hospital.  He denies any pain currently, denies any fever or chills, no dysuria, no cough or chest congestion and otherwise he is feeling at baseline.  ED Course: In the ED he was initially hypotensive, he is orthostatic.  Blood work reveals acute kidney injury with a creatinine of 2.  His CK was slightly elevated 5.2 he was given a bolus of IV fluids and we are asked to admit.  Review of Systems: unable to accurately obtain ROS due to underlying dementia   Past Medical History:  Diagnosis Date  . Cancer ([place]) throat /1998   precancerous polyps  . Cataract    bil removed  . Chronic kidney disease    low functioning kidney disease  . Complication of anesthesia 05/2016   pt was hard to wake up, then when he woke up he became viloent.  Marland Kitchen COPD (chronic obstructive pulmonary disease) ([place])   . Glaucoma   . Hyperlipidemia   . Hypertension   . Memory loss   . Personal history of adenomatous  colonic polyps 03/22/2012   Adenomas 2003 and 2005 01/2010 2.5 cm  TV adenoma of cecum removed 05/2010 - residual TV adenoma removal   . Shortness of breath   . Stroke ([place])    2015  . Wears dentures    full top-partial bottom  . Wears glasses   . Wears hearing aid    both ears    Past Surgical History:  Procedure Laterality Date  . 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 APPENDECTOMY     patient denies  . CAROTID ENDARTERECTOMY Right 05/08/2014  . COLONOSCOPY    . EYE SURGERY Bilateral     laser, cataract surgery  . KNEE SURGERY  1990   arthroscopic on right knee  . MESENTERIC ARTERY BYPASS N/A 06/01/2016   Procedure: AORTO SUPERIOR MESENTERIC AND CELIAC ARTERY BYPASS;  Surgeon: Elam Dutch, MD;  Location: [place];  Service: Vascular;  Laterality: N/A;  . PERIPHERAL VASCULAR CATHETERIZATION N/A 04/10/2016   Procedure: Abdominal Aortogram;  Surgeon: Elam Dutch, MD;  Location: [place] [facility];  Service: Cardiovascular;  Laterality: N/A;  . PERIPHERAL VASCULAR CATHETERIZATION Bilateral 04/10/2016   Procedure: Lower Extremity Angiography;  Surgeon: Elam Dutch, MD;  Location: [place] CV [facility];  Service: Cardiovascular;  Laterality: Bilateral;  . SHOULDER ARTHROSCOPY WITH  ROTATOR CUFF REPAIR AND SUBACROMIAL DECOMPRESSION Right 08/30/2012   Procedure: RIGHT SHOULDER ARTHROSCOPY WITH SUBACROMIAL DECOMPRESSION, DISTAL CLAVICLE RESECTION AND ROTATOR CUFF REPAIR;  Surgeon: Cammie Sickle., MD;  Location: [place];  Service: Orthopedics;  Laterality: Right;  . TONSILLECTOMY    . vocal cord surg  1992   removed precancerous polyps     reports that he quit smoking about 21 years ago. His smoking use included cigarettes. He has never used smokeless tobacco. He reports that he does not drink alcohol or use drugs.  Allergies  Allergen Reactions  . No Known Allergies     Family  History  Problem Relation Age of Onset  . Heart Problems Mother   . Other Father        mining accident  . Colon cancer Neg Hx     Prior to Admission medications   Medication Sig Start Date End Date Taking? Authorizing Provider  Artificial Tear Solution (SYSTANE CONTACTS) SOLN Place 1 drop into both eyes 3 (three) times daily.     [provider]  aspirin EC 81 MG tablet Take 81 mg by mouth daily.    [provider]  atorvastatin (LIPITOR) 40 MG tablet TAKE 1 (ONE) TABLET BY MOUTH AT BEDTIME FOR HIGH CHOLESTEROL 11/16/16   [provider]  brimonidine (ALPHAGAN P) 0.1 % SOLN Place 1 drop into both eyes every 8 (eight) hours.     [provider]  latanoprost (XALATAN) 0.005 % ophthalmic solution Place 1 drop into the left eye at bedtime. 02/27/16   [provider]  lisinopril (PRINIVIL,ZESTRIL) 5 MG tablet Take 5 mg by mouth daily.    [provider]  memantine (NAMENDA) 10 MG tablet Take 1 tablet (10 mg total) by mouth 2 (two) times daily. 12/13/17   Dennie Bible, NP  Multiple Vitamins-Minerals (CENTRUM SILVER 50+MEN PO) Take 1 tablet by mouth daily.    [provider]  Multiple Vitamins-Minerals (OCUVITE ADULT 50+) CAPS Take 1 capsule by mouth daily.    [provider]  Omega-3 Fatty Acids (FISH OIL) 1200 MG CAPS Take 1,200 mg by mouth daily.     [provider]  potassium chloride (K-DUR) 10 MEQ tablet Take 10 mEq by mouth as needed (as needed for leg cramps).    [provider]  QUEtiapine (SEROQUEL) 25 MG tablet Take 1 tablet (25 mg total) by mouth 2 (two) times daily. 03/15/18   Dennie Bible, NP    Physical Exam: Vitals:   04/10/18 1045 04/10/18 1100 04/10/18 1130 04/10/18 1145  BP: 140/80 (!) 167/90 (!) 149/78 (!) 143/75  Pulse: 80 81 81 80  Resp: 15 11 14 15   Temp:      TempSrc:      SpO2: 98% 90% 94% 92%  Weight:      Height:          Constitutional: NAD, calm,  comfortable Eyes: PERRL, lids and conjunctivae normal ENMT: Mucous membranes are moist. Neck: normal, supple Respiratory: clear to auscultation bilaterally, no wheezing, no crackles. Normal respiratory effort. No accessory muscle use.  Overall distant breath sounds Cardiovascular: Regular rate and rhythm, no murmurs / rubs / gallops.  Trace lower extremity edema. 2+ pedal pulses.  Abdomen: no tenderness, no masses palpated. Bowel sounds positive.  Musculoskeletal: no clubbing / cyanosis. Normal muscle tone.  Skin: no rashes, lesions, ulcers. No induration Neurologic: CN 2-12 grossly intact. Strength 5/5 in all 4.  Psychiatric: Normal judgment and insight.  Alert and oriented x 2. Normal mood.   Labs on Admission: I have personally reviewed following labs and imaging studies  CBC: Recent Labs  Lab 04/10/18 0854  WBC 8.0  NEUTROABS 6.3  HGB 12.4*  HCT 39.8  MCV 97.5  PLT 169   Basic Metabolic Panel: Recent Labs  Lab 04/10/18 0854  NA 138  K 5.2*  CL 109  CO2 21*  GLUCOSE 125*  BUN 33*  CREATININE 2.02*  CALCIUM 8.6*   GFR: Estimated Creatinine Clearance: 27.2 mL/min (A) (by C-G formula based on SCr of 2.02 mg/dL (H)). Liver Function Tests: Recent Labs  Lab 04/10/18 0854  AST 28  ALT 30  ALKPHOS 63  BILITOT 0.8  PROT 5.7*  ALBUMIN 3.5   No results for input(s): LIPASE, AMYLASE in the last 168 hours. No results for input(s): AMMONIA in the last 168 hours. Coagulation Profile: No results for input(s): INR, PROTIME in the last 168 hours. Cardiac Enzymes: No results for input(s): CKTOTAL, CKMB, CKMBINDEX, TROPONINI in the last 168 hours. BNP (last 3 results) No results for input(s): PROBNP in the last 8760 hours. HbA1C: No results for input(s): HGBA1C in the last 72 hours. CBG: No results for input(s): GLUCAP in the last 168 hours. Lipid Profile: No results for input(s): CHOL, HDL, LDLCALC, TRIG, CHOLHDL, LDLDIRECT in the last 72 hours. Thyroid Function  Tests: No results for input(s): TSH, T4TOTAL, FREET4, T3FREE, THYROIDAB in the last 72 hours. Anemia Panel: No results for input(s): VITAMINB12, FOLATE, FERRITIN, TIBC, IRON, RETICCTPCT in the last 72 hours. Urine analysis:    Component Value Date/Time   COLORURINE YELLOW 06/08/2016 1620   APPEARANCEUR HAZY (A) 06/08/2016 1620   LABSPEC 1.012 06/08/2016 1620   PHURINE 6.0 06/08/2016 1620   GLUCOSEU NEGATIVE 06/08/2016 1620   HGBUR MODERATE (A) 06/08/2016 1620   BILIRUBINUR NEGATIVE 06/08/2016 1620   KETONESUR NEGATIVE 06/08/2016 1620   PROTEINUR NEGATIVE 06/08/2016 1620   NITRITE NEGATIVE 06/08/2016 1620   LEUKOCYTESUR LARGE (A) 06/08/2016 1620     Radiological Exams on Admission: Dg Chest 2 View  Result Date: 04/10/2018 CLINICAL DATA:  Hypoxia EXAM: CHEST - 2 VIEW COMPARISON:  06/03/2016 FINDINGS: Normal heart size and vascularity. Low lung volumes with minor basilar atelectasis. No focal pneumonia, collapse or consolidation. Negative for significant edema, large effusion or pneumothorax. Trachea is midline. Aorta atherosclerotic. Bones are osteopenic with degenerative changes noted of the spine. IMPRESSION: Low volume exam with basilar atelectasis.  No other acute process. Atherosclerosis Electronically Signed   By: Jerilynn Mages.  Shick M.D.   On: 04/10/2018 11:12    EKG: Independently reviewed. Sinus rhythm   Assessment/Plan Active Problems:   AKI (acute kidney injury) (Grandfather)   Acute kidney injury on chronic kidney disease stage III -Over the last 2 years his creatinine has ranged between 1.2-1.4, creatinine on admission is 2.0.  May be slightly intravascularly volume repleted given orthostasis, continue IV fluids and repeat renal function in the morning -Hold lisinopril -Check bladder scan to evaluate for obstruction  Hyperkalemia -He is on potassium supplements at home PRN, unclear how much he is taking it, will hold, also hold lisinopril.  Kayexalate x1.  Monitor on telemetry  x24-hour if no events can DC tomorrow  Hyperlipidemia -Continue statin  Dementia -Continue home medications, continue Seroquel  Weakness / falls -obtain PT consult    DVT prophylaxis: Lovenox Code Status: Full code Family Communication: 3 family members at bedside Disposition Plan: Admit to telemetry Consults called: None   Mykelti Goldenstein,  MD, PhD [facility] Pager [phone]  If 7PM-7AM, please contact night-coverage [email or web address] Password [facility]  04/10/2018, 12:23 PM

## 2018-04-10 NOTE — Progress Notes (Signed)
Patient admitted to room 5west 27, unable to void. Bladder scanned for 170cc. Spoke with MD Cruzita Lederer and wants foley ordered. Ellamae Sia

## 2018-04-10 NOTE — ED Provider Notes (Signed)
[facility] EMERGENCY DEPARTMENT Provider Note   CSN: [id number] Arrival date & time: 04/10/18  5643     History   Chief Complaint Chief Complaint  Patient presents with  . Fall  . Hypotension    HPI Keith Gibson is a 77 y.o. male.  HPI Presents with fall from standing this morning.  States he was feeling dizzy prior to the fall.  He denies any head or neck trauma.  Was noted to be hypotensive by EMS.  Given normal saline in route with improvement of blood pressure.  Denies focal weakness or numbness. Past Medical History:  Diagnosis Date  . Cancer ([place]) throat /1998   precancerous polyps  . Cataract    bil removed  . Chronic kidney disease    low functioning kidney disease  . Complication of anesthesia 05/2016   pt was hard to wake up, then when he woke up he became viloent.  Marland Kitchen COPD (chronic obstructive pulmonary disease) ([place])   . Glaucoma   . Hyperlipidemia   . Hypertension   . Memory loss   . 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 Shortness of breath   . Stroke ([place])    2015  . Wears dentures    full top-partial bottom  . Wears glasses   . Wears hearing aid    both ears    Patient Active Problem List   Diagnosis Date Noted  . Hallucinations, unspecified 03/09/2018  . Dementia ([place]) 07/07/2016  . 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 Pre-op evaluation 05/01/2016  . Personal history of adenomatous  colonic polyps 03/22/2012    Past Surgical History:  Procedure Laterality Date  . 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 APPENDECTOMY     patient denies  . CAROTID ENDARTERECTOMY Right 05/08/2014  . COLONOSCOPY    . EYE SURGERY Bilateral     laser, cataract surgery  .  KNEE SURGERY  1990   arthroscopic on right knee  . MESENTERIC ARTERY BYPASS N/A 06/01/2016   Procedure: AORTO SUPERIOR MESENTERIC AND CELIAC ARTERY BYPASS;  Surgeon: Elam Dutch, MD;  Location: [place];  Service: Vascular;  Laterality: N/A;  . PERIPHERAL VASCULAR CATHETERIZATION N/A 04/10/2016   Procedure: Abdominal Aortogram;  Surgeon: Elam Dutch, MD;  Location: [place] [facility];  Service: Cardiovascular;  Laterality: N/A;  . PERIPHERAL VASCULAR CATHETERIZATION Bilateral 04/10/2016   Procedure: Lower Extremity Angiography;  Surgeon: Elam Dutch, MD;  Location: [place] [facility] LAB;  Service: Cardiovascular;  Laterality: Bilateral;  . SHOULDER ARTHROSCOPY WITH ROTATOR CUFF REPAIR AND SUBACROMIAL DECOMPRESSION Right 08/30/2012   Procedure: RIGHT SHOULDER ARTHROSCOPY WITH SUBACROMIAL DECOMPRESSION, DISTAL CLAVICLE RESECTION AND ROTATOR CUFF REPAIR;  Surgeon: Cammie Sickle., MD;  Location: [place];  Service: Orthopedics;  Laterality: Right;  . TONSILLECTOMY    . vocal cord surg  1992   removed precancerous polyps        Home Medications    Prior to Admission medications   Medication Sig Start Date End Date Taking? Authorizing Provider  Artificial Tear Solution (SYSTANE CONTACTS) SOLN Place 1 drop into both eyes 3 (three) times daily.     [provider]  aspirin EC 81 MG  tablet Take 81 mg by mouth daily.    [provider]  atorvastatin (LIPITOR) 40 MG tablet TAKE 1 (ONE) TABLET BY MOUTH AT BEDTIME FOR HIGH CHOLESTEROL 11/16/16   [provider]  brimonidine (ALPHAGAN P) 0.1 % SOLN Place 1 drop into both eyes every 8 (eight) hours.     [provider]  latanoprost (XALATAN) 0.005 % ophthalmic solution Place 1 drop into the left eye at bedtime. 02/27/16   [provider]  lisinopril (PRINIVIL,ZESTRIL) 5 MG tablet Take 5 mg by mouth daily.    [provider]  memantine (NAMENDA) 10 MG tablet Take 1 tablet  (10 mg total) by mouth 2 (two) times daily. 12/13/17   Dennie Bible, NP  Multiple Vitamins-Minerals (CENTRUM SILVER 50+MEN PO) Take 1 tablet by mouth daily.    [provider]  Multiple Vitamins-Minerals (OCUVITE ADULT 50+) CAPS Take 1 capsule by mouth daily.    [provider]  Omega-3 Fatty Acids (FISH OIL) 1200 MG CAPS Take 1,200 mg by mouth daily.     [provider]  potassium chloride (K-DUR) 10 MEQ tablet Take 10 mEq by mouth as needed (as needed for leg cramps).    [provider]  QUEtiapine (SEROQUEL) 25 MG tablet Take 1 tablet (25 mg total) by mouth 2 (two) times daily. 03/15/18   Dennie Bible, NP    Family History Family History  Problem Relation Age of Onset  . Heart Problems Mother   . Other Father        mining accident  . Colon cancer Neg Hx     Social History Social History   Tobacco Use  . Smoking status: Former Smoker    Types: Cigarettes    Last attempt to quit: 03/08/1997    Years since quitting: 21.1  . Smokeless tobacco: Never Used  Substance Use Topics  . Alcohol use: No  . Drug use: No     Allergies   No known allergies   Review of Systems Review of Systems  Constitutional: Negative for chills and fever.  Respiratory: Negative for cough and shortness of breath.   Cardiovascular: Negative for chest pain.  Gastrointestinal: Negative for abdominal pain, constipation, diarrhea, nausea and vomiting.  Genitourinary: Negative for dysuria and hematuria.  Musculoskeletal: Positive for back pain, myalgias and neck pain. Negative for neck stiffness.  Skin: Negative for rash and wound.  Neurological: Positive for dizziness and light-headedness. Negative for syncope, weakness, numbness and headaches.  All other systems reviewed and are negative.    Physical Exam Updated Vital Signs BP (!) 143/75   Pulse 80   Temp 97.7 F (36.5 C) (Oral)   Resp 15   Ht 5\' 6"  (1.676 m)   Wt 70.8 kg   SpO2 92%    BMI 25.18 kg/m   Physical Exam  Constitutional: He appears well-developed and well-nourished. No distress.  HENT:  Head: Normocephalic and atraumatic.  Mouth/Throat: Oropharynx is clear and moist.  No obvious scalp trauma.  Midface is stable.  No intraoral trauma.  Eyes: Pupils are equal, round, and reactive to light. EOM are normal.  Neck: Normal range of motion. Neck supple.  No posterior midline cervical tenderness to palpation.  Patient does have some muscular tenderness of the left trapezius.  Cardiovascular: Normal rate and regular rhythm. Exam reveals no gallop and no friction rub.  No murmur heard. Pulmonary/Chest: Effort normal and breath sounds normal. No stridor. No respiratory distress. He has no wheezes. He has  no rales. He exhibits no tenderness.  Abdominal: Soft. Bowel sounds are normal. There is no tenderness. There is no rebound and no guarding.  Musculoskeletal: Normal range of motion. He exhibits no edema or tenderness.  No midline thoracic or lumbar tenderness.  No lower extremity swelling, asymmetry or tenderness.  Full range of motion of bilateral hips without discomfort.  Distal pulses intact.  Neurological: He is alert.  Mild confusion.  Following commands.  5/5 motor in all extremities.  Sensation intact.  Skin: Skin is warm and dry. Capillary refill takes less than 2 seconds. No rash noted. He is not diaphoretic. No erythema.  Psychiatric: He has a normal mood and affect. His behavior is normal.  Nursing note and vitals reviewed.    ED Treatments / Results  Labs (all labs ordered are listed, but only abnormal results are displayed) Labs Reviewed  CBC WITH DIFFERENTIAL/PLATELET - Abnormal; Notable for the following components:      Result Value   RBC 4.08 (*)    Hemoglobin 12.4 (*)    All other components within normal limits  COMPREHENSIVE METABOLIC PANEL - Abnormal; Notable for the following components:   Potassium 5.2 (*)    CO2 21 (*)    Glucose, Bld  125 (*)    BUN 33 (*)    Creatinine, Ser 2.02 (*)    Calcium 8.6 (*)    Total Protein 5.7 (*)    GFR calc non Af Amer 30 (*)    GFR calc Af Amer 35 (*)    All other components within normal limits  URINALYSIS, ROUTINE W REFLEX MICROSCOPIC    EKG EKG Interpretation  Date/Time:  Sunday April 10 2018 08:38:32 EST Ventricular Rate:  76 PR Interval:    QRS Duration: 100 QT Interval:  385 QTC Calculation: 433 R Axis:   66 Text Interpretation:  Sinus rhythm Abnormal R-wave progression, early transition Confirmed by Julianne Rice [phone]) on 04/10/2018 12:07:26 PM   Radiology Dg Chest 2 View  Result Date: 04/10/2018 CLINICAL DATA:  Hypoxia EXAM: CHEST - 2 VIEW COMPARISON:  06/03/2016 FINDINGS: Normal heart size and vascularity. Low lung volumes with minor basilar atelectasis. No focal pneumonia, collapse or consolidation. Negative for significant edema, large effusion or pneumothorax. Trachea is midline. Aorta atherosclerotic. Bones are osteopenic with degenerative changes noted of the spine. IMPRESSION: Low volume exam with basilar atelectasis.  No other acute process. Atherosclerosis Electronically Signed   By: Jerilynn Mages.  Shick M.D.   On: 04/10/2018 11:12    Procedures Procedures (including critical care time)  Medications Ordered in ED Medications  sodium chloride 0.9 % bolus 500 mL (0 mLs Intravenous Stopped 04/10/18 1005)  sodium chloride 0.9 % bolus 500 mL (500 mLs Intravenous New Bag/Given 04/10/18 1023)     Initial Impression / Assessment and Plan / ED Course  I have reviewed the triage vital signs and the nursing notes.  Pertinent labs & imaging results that were available during my care of the patient were reviewed by me and considered in my medical decision making (see chart for details).     Patient with acute kidney injury likely related to dehydration.  He is orthostatic in the emergency department.  Given several fluid boluses.  Discussed with hospitalist who will  admit for rehydration Final Clinical Impressions(s) / ED Diagnoses   Final diagnoses:  Hypotension, unspecified hypotension type  Dehydration    ED Discharge Orders    None       Julianne Rice, MD 04/10/18 1208

## 2018-04-10 NOTE — ED Notes (Signed)
Patient transported to X-ray 

## 2018-04-10 NOTE — ED Notes (Signed)
Pt. Has urine cup at bedside to provide sample when able to go

## 2018-04-11 ENCOUNTER — Observation Stay (HOSPITAL_COMMUNITY): Payer: Medicare Other

## 2018-04-11 DIAGNOSIS — N2889 Other specified disorders of kidney and ureter: Secondary | ICD-10-CM | POA: Diagnosis present

## 2018-04-11 DIAGNOSIS — N179 Acute kidney failure, unspecified: Secondary | ICD-10-CM | POA: Diagnosis not present

## 2018-04-11 LAB — MAGNESIUM: Magnesium: 1.9 mg/dL (ref 1.7–2.4)

## 2018-04-11 LAB — COMPREHENSIVE METABOLIC PANEL
ALK PHOS: 54 U/L (ref 38–126)
ALT: 24 U/L (ref 0–44)
ANION GAP: 3 — AB (ref 5–15)
AST: 32 U/L (ref 15–41)
Albumin: 3 g/dL — ABNORMAL LOW (ref 3.5–5.0)
BUN: 25 mg/dL — ABNORMAL HIGH (ref 8–23)
CALCIUM: 8.3 mg/dL — AB (ref 8.9–10.3)
CO2: 23 mmol/L (ref 22–32)
Chloride: 112 mmol/L — ABNORMAL HIGH (ref 98–111)
Creatinine, Ser: 1.66 mg/dL — ABNORMAL HIGH (ref 0.61–1.24)
GFR calc non Af Amer: 38 mL/min — ABNORMAL LOW (ref >60)
GFR, EST AFRICAN AMERICAN: 44 mL/min — AB (ref >60)
Glucose, Bld: 93 mg/dL (ref 70–99)
Potassium: 4.1 mmol/L (ref 3.5–5.1)
SODIUM: 138 mmol/L (ref 135–145)
TOTAL PROTEIN: 5.1 g/dL — AB (ref 6.5–8.1)
Total Bilirubin: 1 mg/dL (ref 0.3–1.2)

## 2018-04-11 LAB — CBC
HCT: 34.7 % — ABNORMAL LOW (ref 39.0–52.0)
HEMOGLOBIN: 11.6 g/dL — AB (ref 13.0–17.0)
MCH: 31.4 pg (ref 26.0–34.0)
MCHC: 33.4 g/dL (ref 30.0–36.0)
MCV: 94 fL (ref 80.0–100.0)
Platelets: 124 10*3/uL — ABNORMAL LOW (ref 150–400)
RBC: 3.69 MIL/uL — ABNORMAL LOW (ref 4.22–5.81)
RDW: 12.8 % (ref 11.5–15.5)
WBC: 5.6 10*3/uL (ref 4.0–10.5)
nRBC: 0 % (ref 0.0–0.2)

## 2018-04-11 MED ORDER — TAMSULOSIN HCL 0.4 MG PO CAPS
0.4000 mg | ORAL_CAPSULE | Freq: Every day | ORAL | Status: DC
  Administered 2018-04-11 – 2018-04-12 (×2): 0.4 mg via ORAL
  Filled 2018-04-11 (×2): qty 1

## 2018-04-11 MED ORDER — ENOXAPARIN SODIUM 40 MG/0.4ML ~~LOC~~ SOLN: 40.0000 mg | SUBCUTANEOUS | Status: DC

## 2018-04-11 MED ORDER — LORAZEPAM 2 MG/ML IJ SOLN
1.0000 mg | Freq: Once | INTRAMUSCULAR | Status: AC
  Administered 2018-04-11: 1 mg via INTRAVENOUS
  Filled 2018-04-11: qty 1

## 2018-04-11 MED ORDER — ARIPIPRAZOLE 2 MG PO TABS
2.0000 mg | ORAL_TABLET | Freq: Every day | ORAL | Status: DC
  Administered 2018-04-11 – 2018-04-12 (×2): 2 mg via ORAL
  Filled 2018-04-11 (×3): qty 1

## 2018-04-11 MED ORDER — LACTATED RINGERS IV SOLN
INTRAVENOUS | Status: DC
  Administered 2018-04-11 – 2018-04-12 (×2): via INTRAVENOUS

## 2018-04-11 MED ORDER — SODIUM CHLORIDE 0.9 % IV SOLN
INTRAVENOUS | Status: DC
  Administered 2018-04-11: 09:00:00 via INTRAVENOUS

## 2018-04-11 MED ORDER — GADOBUTROL 1 MMOL/ML IV SOLN
7.0000 mL | Freq: Once | INTRAVENOUS | Status: AC | PRN
  Administered 2018-04-11: 7 mL via INTRAVENOUS

## 2018-04-11 MED ORDER — HALOPERIDOL LACTATE 5 MG/ML IJ SOLN: 2.0000 mg | Freq: Four times a day (QID) | INTRAMUSCULAR | Status: DC | PRN

## 2018-04-11 NOTE — Progress Notes (Signed)
PROGRESS NOTE                                                                                                                                                                                                             Patient Demographics:    Keith Gibson, is a 77 y.o. male, DOB - 03-04-1941, QMG:[id number]  Admit date - 04/10/2018   Admitting Physician Costin Karlyne Greenspan, MD  Outpatient Primary MD for the patient is [facility]  LOS - 0  Chief Complaint  Patient presents with  . Fall  . Hypotension       Brief Narrative  Keith Gibson is a 77 y.o. male with medical history significant of COPD with prior tobacco use, hyperlipidemia, hypertension, dementia who presents to the hospital brought by family due to generalized weakness and having a fall today.  Patient states that his legs just gave out and he had a fall, denies loss of consciousness, denies any chest pain or shortness of breath, denies any palpitations.  He does not recall hitting his head.  Wife who is at bedside tells me that he has been having multiple recent falls but usually gets up on his own, in the ER work-up showed dehydration, hypotension, orthostatic hypotension, ARF along with a small L2 fracture.   Subjective:    Keith Gibson today has, No headache, No chest pain, No abdominal pain - No Nausea, No new weakness tingling or numbness, No Cough - SOB.     Assessment  & Plan :     1.  Dehydration, hypotension, ARF.  Improving with IV fluids which will be continued, continue to hold ACE inhibitor, discontinue Foley and monitor bladder emptying.  2.  Hyperkalemia.  Due to ARF resolved.  3.  Dyslipidemia.  On statin.  4.  Dementia.  At risk for delirium, family updated, avoid benzodiazepines and narcotics, Haldol as needed if needed for delirium.  Renew home medications.  5.  Multiple falls at home.  PT eval.  6.  Nondisplaced ankle fracture sustained after a fall yesterday at home.   Lumbar brace, supportive care, PT.  7.  Right Renal mass noted on renal ultrasound.  Likely renal cyst, check MRI.    Family Communication  :  Family  Code Status :  Full  Disposition Plan  :  Home 1-2 days  Consults  :  None  Procedures  :    Renal US -    1. Cortical thinning bilaterally suggesting  medical renal disease. 2. The 1.4 cm mass in the right kidney is favored to represent a cyst but is not determinant on this study and was not seen on the January 2018 CT scan. Recommend an MRI for more definitive characterization. 3. No other abnormalities.  No hydronephrosis.    MRI Abd  -   DVT Prophylaxis  :  Lovenox    Lab Results  Component Value Date   PLT 124 (L) 04/11/2018    Diet :  Diet Order            Diet regular Room service appropriate? Yes; Fluid consistency: Thin  Diet effective now               Inpatient Medications Scheduled Meds: . ARIPiprazole  2 mg Oral Daily  . aspirin EC  81 mg Oral Daily  . atorvastatin  40 mg Oral q1800  . enoxaparin (LOVENOX) injection  30 mg Subcutaneous Q24H  . feeding supplement (ENSURE ENLIVE)  237 mL Oral BID BM  . mouth rinse  15 mL Mouth Rinse BID  . memantine  10 mg Oral BID  . multivitamin with minerals   Oral Daily  . QUEtiapine  25 mg Oral BID  . tamsulosin  0.4 mg Oral Daily   Continuous Infusions: . lactated ringers     PRN Meds:.HYDROcodone-acetaminophen  Antibiotics  :   Anti-infectives (From admission, onward)   None          Objective:   Vitals:   04/10/18 1245 04/10/18 1300 04/10/18 1403 04/11/18 0601  BP: (!) 142/79 140/79 (!) 152/93 125/81  Pulse: 81 81 80 66  Resp: 14 16 18 18   Temp:   98.3 F (36.8 C) 98 F (36.7 C)  TempSrc:   Oral Oral  SpO2: 93% 91% 95% 91%  Weight:      Height:        Wt Readings from Last 3 Encounters:  04/10/18 70.8 kg  03/09/18 64.6 kg  12/13/17 66 kg     Intake/Output Summary (Last 24 hours) at 04/11/2018 1059 Last data filed at 04/11/2018  0602 Gross per 24 hour  Intake 1376.44 ml  Output 1355 ml  Net 21.44 ml     Physical Exam  Awake, mildly confused, No new F.N deficits, Normal affect [place].AT,PERRAL Supple Neck,No JVD, No cervical lymphadenopathy appriciated.  Symmetrical Chest wall movement, Good air movement bilaterally, CTAB RRR,No Gallops,Rubs or new Murmurs, No Parasternal Heave +ve B.Sounds, Abd Soft, No tenderness, No organomegaly appriciated, No rebound - guarding or rigidity. No Cyanosis, Clubbing or edema, No new Rash or bruise       Data Review:    CBC Recent Labs  Lab 04/10/18 0854 04/11/18 0723  WBC 8.0 5.6  HGB 12.4* 11.6*  HCT 39.8 34.7*  PLT 155 124*  MCV 97.5 94.0  MCH 30.4 31.4  MCHC 31.2 33.4  RDW 12.8 12.8  LYMPHSABS 1.0  --   MONOABS 0.5  --   EOSABS 0.1  --   BASOSABS 0.0  --     Chemistries  Recent Labs  Lab 04/10/18 0854 04/11/18 0723  NA 138 138  K 5.2* 4.1  CL 109 112*  CO2 21* 23  GLUCOSE 125* 93  BUN 33* 25*  CREATININE 2.02* 1.66*  CALCIUM 8.6* 8.3*  MG  --  1.9  AST 28 32  ALT 30 24  ALKPHOS 63 54  BILITOT 0.8 1.0   ------------------------------------------------------------------------------------------------------------------ No results for input(s): CHOL, HDL, LDLCALC, TRIG,  CHOLHDL, LDLDIRECT in the last 72 hours.  No results found for: HGBA1C ------------------------------------------------------------------------------------------------------------------ No results for input(s): TSH, T4TOTAL, T3FREE, THYROIDAB in the last 72 hours.  Invalid input(s): FREET3 ------------------------------------------------------------------------------------------------------------------ No results for input(s): VITAMINB12, FOLATE, FERRITIN, TIBC, IRON, RETICCTPCT in the last 72 hours.  Coagulation profile No results for input(s): INR, PROTIME in the last 168 hours.  No results for input(s): DDIMER in the last 72 hours.  Cardiac Enzymes No results for  input(s): CKMB, TROPONINI, MYOGLOBIN in the last 168 hours.  Invalid input(s): CK ------------------------------------------------------------------------------------------------------------------ No results found for: BNP  Micro Results No results found for this or any previous visit (from the past 240 hour(s)).  Radiology Reports Dg Chest 2 View  Result Date: 04/10/2018 CLINICAL DATA:  Hypoxia EXAM: CHEST - 2 VIEW COMPARISON:  06/03/2016 FINDINGS: Normal heart size and vascularity. Low lung volumes with minor basilar atelectasis. No focal pneumonia, collapse or consolidation. Negative for significant edema, large effusion or pneumothorax. Trachea is midline. Aorta atherosclerotic. Bones are osteopenic with degenerative changes noted of the spine. IMPRESSION: Low volume exam with basilar atelectasis.  No other acute process. Atherosclerosis Electronically Signed   By: Jerilynn Mages.  Shick M.D.   On: 04/10/2018 11:12   Dg Lumbar Spine 2-3 Views  Result Date: 04/10/2018 CLINICAL DATA:  Low abdominal pain. Multiple recent falls. Renal failure. EXAM: LUMBAR SPINE - 2-3 VIEW COMPARISON:  Abdominopelvic CT 06/11/2016. FINDINGS: The bones are demineralized. There are 5 lumbar type vertebral bodies. There is an acute fracture involving the superior endplate of L2 with resulting displacement the anterior cortex and approximately 35% loss of vertebral body height. There is no osseous retropulsion or widening of the interpedicular distance. No other acute osseous findings are seen. There is minimal spondylosis for age. The alignment is normal. There is extensive aortic and branch vessel atherosclerosis. IMPRESSION: Acute fracture involving the superior endplate of L2 without apparent osseous retropulsion or malalignment. Aortic atherosclerosis. Electronically Signed   By: Richardean Sale M.D.   On: 04/10/2018 17:03   US Renal  Result Date: 04/10/2018 CLINICAL DATA:  Renal failure EXAM: RENAL / URINARY TRACT  ULTRASOUND COMPLETE COMPARISON:  None. FINDINGS: Right Kidney: Renal measurements: 8.9 x 4.3 x 4.6 cm = volume: 90.2 mL. Contains a 1.4 x 1.0 x 1.0 cm hypoechoic mass, favored to represent a cyst but not completely characterize. A cyst was not seen in the right kidney on the June 11, 2016 comparison CT the abdomen and pelvis. Left Kidney: Renal measurements: 10 x 4.8 x 5.4 cm = volume: 135.7 mL. The cyst seen off the upper pole the left kidney on the comparison CT scan is not visualized. Bladder: Not visualized due to decompression. IMPRESSION: 1. Cortical thinning bilaterally suggesting medical renal disease. 2. The 1.4 cm mass in the right kidney is favored to represent a cyst but is not determinant on this study and was not seen on the January 2018 CT scan. Recommend an MRI for more definitive characterization. 3. No other abnormalities.  No hydronephrosis. Electronically Signed   By: Dorise Bullion III M.D   On: 04/10/2018 16:51    Time Spent in minutes  30   Lala Lund M.D on 04/11/2018 at 10:59 AM  To page go to [email or web address] - password [facility]

## 2018-04-11 NOTE — Progress Notes (Signed)
PT Cancellation Note  Patient Details Name: Keith Gibson MRN: [id number] DOB: [birth year]   Cancelled Treatment:    Reason Eval/Treat Not Completed: (P) Patient at procedure or test/unavailable(Pt off unit for MRI will continue efforts per POC.  )   Keith Gibson Keith Gibson 04/11/2018, 4:27 PM  Keith Gibson, PTA Acute Rehabilitation Services Pager [phone] Office [phone]

## 2018-04-11 NOTE — Progress Notes (Signed)
Pt is currently NPO Awaiting MRI at 1030

## 2018-04-11 NOTE — Progress Notes (Signed)
Dr. Candiss Norse requested patient to have NO more ativan in the future.

## 2018-04-11 NOTE — Progress Notes (Signed)
Orthopedic Tech Progress Note Patient Details:  Keith Gibson [birth year] [id number]  Patient ID: Keith Gibson, male   DOB: 18-Feb-1941, 77 y.o.   MRN: [id number]   [name] 04/11/2018, 9:26 AMCalled [employer] for Lumbar corset.

## 2018-04-11 NOTE — Progress Notes (Signed)
Nutrition Brief Note  Patient identified on the Malnutrition Screening Tool (MST) Report.  Spoke with pt's wife and other family members at bedside. Pt sleeping soundly at time of visit.  Per pt's wife, pt with good appetite and PO intake PTA. Pt's wife denies recent weight loss, stating that pt's weight fluctuates unrelated to food choices.  Pt's wife reports that pt has not eaten as much since admission as he does at home due to lethargy which is a side effect of medications.  Wt Readings from Last 15 Encounters:  04/10/18 70.8 kg  03/09/18 64.6 kg  12/13/17 66 kg  06/14/17 68.5 kg  05/27/17 66.7 kg  01/07/17 65.8 kg  12/09/16 64 kg  11/30/16 60.8 kg  09/07/16 63.5 kg  07/07/16 60.6 kg  07/02/16 61.5 kg  06/01/16 68.5 kg  05/27/16 68.9 kg  05/07/16 68 kg  05/06/16 69.4 kg    Body mass index is 25.18 kg/m. Patient meets criteria for overweight based on current BMI.   Current diet order is Regular. No meal completion recorded in chart at this time. Labs and medications reviewed.   No nutrition interventions warranted at this time. Will d/c order for Ensure Enlive. If nutrition issues arise, please consult RD.    Gaynell Face, MS, RD, LDN Inpatient Clinical Dietitian Pager: [phone] Weekend/After Hours: [phone]

## 2018-04-12 DIAGNOSIS — N179 Acute kidney failure, unspecified: Secondary | ICD-10-CM | POA: Diagnosis not present

## 2018-04-12 LAB — MAGNESIUM: Magnesium: 1.8 mg/dL (ref 1.7–2.4)

## 2018-04-12 LAB — CBC
HEMATOCRIT: 35.4 % — AB (ref 39.0–52.0)
HEMOGLOBIN: 11.7 g/dL — AB (ref 13.0–17.0)
MCH: 30.9 pg (ref 26.0–34.0)
MCHC: 33.1 g/dL (ref 30.0–36.0)
MCV: 93.4 fL (ref 80.0–100.0)
Platelets: 126 10*3/uL — ABNORMAL LOW (ref 150–400)
RBC: 3.79 MIL/uL — AB (ref 4.22–5.81)
RDW: 12.7 % (ref 11.5–15.5)
WBC: 5.4 10*3/uL (ref 4.0–10.5)
nRBC: 0 % (ref 0.0–0.2)

## 2018-04-12 LAB — BASIC METABOLIC PANEL
Anion gap: 6 (ref 5–15)
BUN: 21 mg/dL (ref 8–23)
CHLORIDE: 108 mmol/L (ref 98–111)
CO2: 24 mmol/L (ref 22–32)
CREATININE: 1.49 mg/dL — AB (ref 0.61–1.24)
Calcium: 8.6 mg/dL — ABNORMAL LOW (ref 8.9–10.3)
GFR calc Af Amer: 50 mL/min — ABNORMAL LOW (ref >60)
GFR calc non Af Amer: 43 mL/min — ABNORMAL LOW (ref >60)
GLUCOSE: 97 mg/dL (ref 70–99)
POTASSIUM: 4 mmol/L (ref 3.5–5.1)
Sodium: 138 mmol/L (ref 135–145)

## 2018-04-12 MED ORDER — TAMSULOSIN HCL 0.4 MG PO CAPS: 0.4000 mg | ORAL_CAPSULE | Freq: Every day | ORAL | 0 refills | Status: AC

## 2018-04-12 NOTE — Discharge Summary (Signed)
Keith Gibson BWG:[id number] DOB: July 04, 1940 DOA: 04/10/2018  PCP: [facility] [place] date: 04/10/2018  Discharge date: 04/12/2018  Admitted From: Home   Disposition:  Home   Recommendations for Outpatient Follow-up:   Follow up with PCP in 1-2 weeks  PCP Please obtain BMP/CBC, 2 view CXR in 1week,  (see Discharge instructions)   PCP Please follow up on the following pending results:    Home Health: PT,RN   Equipment/Devices: Walker  Consultations: None Discharge Condition: Fair   CODE STATUS: Full   Diet Recommendation: Heart Healthy    Chief Complaint  Patient presents with  . Fall  . Hypotension     Brief history of present illness from the day of admission and additional interim summary    Keith Gibson a 77 y.o.malewith medical history significant ofCOPD with prior tobacco use, hyperlipidemia, hypertension, dementia who presents to the hospital brought by family due to generalized weakness and having a fall today. Patient states that his legs just gave out and he had a fall, denies loss of consciousness, denies any chest pain or shortness of breath, denies any palpitations. He does not recall hitting his head. Wife who is at bedside tells me that he has been having multiple recent falls but usually gets up on his own, in the ER work-up showed dehydration, hypotension, orthostatic hypotension, ARF along with a small L2 fracture.                                                                 Hospital Course   1.  Dehydration, hypotension, ARF. Improved after IVF and Wallace ACE, renal function much improved, will hold at DC, PCP to recheck BMP in 1 week.  2.  Hyperkalemia.  Due to ARF, resolved.  3.  Dyslipidemia.  On statin.  4.  Dementia.  At risk for delirium,  family updated, avoid benzodiazepines and narcotics, Haldol as needed if needed for delirium.  Renew home medications.  5.  Multiple falls at home.  PT eval.  6.  Nondisplaced L2 fracture sustained after a fall at home.  Lumbar brace, supportive care, PT. Improved - follow with PCP.  7.  Right Renal mass noted on renal ultrasound.  inconclusive MRI. Urology follow up in 2 weeks, PCP to arrange.     Discharge diagnosis     Principal Problem:   AKI (acute kidney injury) ([place]) Active Problems:   Dementia ([place])   Hallucinations, unspecified   Renal mass    Discharge instructions    Discharge Instructions    Diet - low sodium heart healthy   Complete by:  As directed    Discharge instructions   Complete by:  As directed    Follow with Primary MD [facility] [place] in 7 days, discuss  your kidney ultrasound and abdominal MRI report next visit.  Get CBC, CMP  checked  by Primary MD  in 5-7 days   Activity: As tolerated with Full fall precautions use walker/cane & assistance as needed , wear the lumbar brace when out of the bed you can take it off while taking a shower.  Disposition Home   Diet: Heart Healthy    Special Instructions: If you have smoked or chewed Tobacco  in the last 2 yrs please stop smoking, stop any regular Alcohol  and or any Recreational drug use.  On your next visit with your primary care physician please Get Medicines reviewed and adjusted.  Please request your Prim.MD to go over all Hospital Tests and Procedure/Radiological results at the follow up, please get all Hospital records sent to your Prim MD by signing hospital release before you go home.  If you experience worsening of your admission symptoms, develop shortness of breath, life threatening emergency, suicidal or homicidal thoughts you must seek medical attention immediately by calling 911 or calling your MD immediately  if symptoms less severe.  You Must read complete  instructions/literature along with all the possible adverse reactions/side effects for all the Medicines you take and that have been prescribed to you. Take any new Medicines after you have completely understood and accpet all the possible adverse reactions/side effects.   Increase activity slowly   Complete by:  As directed       Discharge Medications   Allergies as of 04/12/2018   No Known Allergies     Medication List    STOP taking these medications   lisinopril 5 MG tablet Commonly known as:  PRINIVIL,ZESTRIL     TAKE these medications   ARIPiprazole 2 MG tablet Commonly known as:  ABILIFY Take 2 mg by mouth daily.   aspirin EC 81 MG tablet Take 81 mg by mouth daily.   atorvastatin 40 MG tablet Commonly known as:  LIPITOR Take 40 mg by mouth daily at 6 PM.   brimonidine 0.1 % Soln Commonly known as:  ALPHAGAN P Place 1 drop into both eyes every 8 (eight) hours.   CENTRUM SILVER 50+MEN PO Take 1 tablet by mouth daily.   OCUVITE ADULT 50+ Caps Take 1 capsule by mouth daily.   Fish Oil 1200 MG Caps Take 1,200 mg by mouth daily.   latanoprost 0.005 % ophthalmic solution Commonly known as:  XALATAN Place 1 drop into the left eye at bedtime.   memantine 10 MG tablet Commonly known as:  NAMENDA Take 1 tablet (10 mg total) by mouth 2 (two) times daily.   potassium chloride 10 MEQ tablet Commonly known as:  K-DUR Take 10 mEq by mouth as needed (as needed for leg cramps).   QUEtiapine 25 MG tablet Commonly known as:  SEROQUEL Take 1 tablet (25 mg total) by mouth 2 (two) times daily.   SYSTANE CONTACTS Soln Place 1 drop into both eyes 3 (three) times daily.   tamsulosin 0.4 MG Caps capsule Commonly known as:  FLOMAX Take 1 capsule (0.4 mg total) by mouth daily.       Follow-up Information    [facility]. Schedule an appointment as soon as possible for a visit in 1 week(s).   Contact information: [place]  [id number] [phone]        Alexis Frock, MD. Schedule an appointment as soon as possible for a visit in 2 week(s).   Specialty:  Urology  Why:  Kidney cyst Contact information: [place] [address] [phone]           Major procedures and Radiology Reports - PLEASE review detailed and final reports thoroughly  -     Renal US -    1. Cortical thinning bilaterally suggesting medical renal disease. 2. The 1.4 cm mass in the right kidney is favored to represent a cyst but is not determinant on this study and was not seen on the January 2018 CT scan. Recommend an MRI for more definitive characterization. 3. No other abnormalities.  No hydronephrosis.    MRI Abd  -  Markedly degraded study due to motion artifact. Portions of the exam are nondiagnostic. 2.9 x 2.1 cm bilobed or septated cyst in the upper pole left kidney without obvious or gross enhancement but assessment limited by motion. Follow-up imaging after resolution of acute symptoms when patient is better able to cooperate with breath holding recommended for definitive assessment.    Dg Chest 2 View  Result Date: 04/10/2018 CLINICAL DATA:  Hypoxia EXAM: CHEST - 2 VIEW COMPARISON:  06/03/2016 FINDINGS: Normal heart size and vascularity. Low lung volumes with minor basilar atelectasis. No focal pneumonia, collapse or consolidation. Negative for significant edema, large effusion or pneumothorax. Trachea is midline. Aorta atherosclerotic. Bones are osteopenic with degenerative changes noted of the spine. IMPRESSION: Low volume exam with basilar atelectasis.  No other acute process. Atherosclerosis Electronically Signed   By: Jerilynn Mages.  Shick M.D.   On: 04/10/2018 11:12   Dg Lumbar Spine 2-3 Views  Result Date: 04/10/2018 CLINICAL DATA:  Low abdominal pain. Multiple recent falls. Renal failure. EXAM: LUMBAR SPINE - 2-3 VIEW COMPARISON:  Abdominopelvic CT 06/11/2016. FINDINGS: The bones are demineralized. There are  5 lumbar type vertebral bodies. There is an acute fracture involving the superior endplate of L2 with resulting displacement the anterior cortex and approximately 35% loss of vertebral body height. There is no osseous retropulsion or widening of the interpedicular distance. No other acute osseous findings are seen. There is minimal spondylosis for age. The alignment is normal. There is extensive aortic and branch vessel atherosclerosis. IMPRESSION: Acute fracture involving the superior endplate of L2 without apparent osseous retropulsion or malalignment. Aortic atherosclerosis. Electronically Signed   By: Richardean Sale M.D.   On: 04/10/2018 17:03   Mr Abdomen W Wo Contrast  Result Date: 04/11/2018 CLINICAL DATA:  Right renal lesion on recent ultrasound. EXAM: MRI ABDOMEN WITHOUT AND WITH CONTRAST TECHNIQUE: Multiplanar multisequence MR imaging of the abdomen was performed both before and after the administration of intravenous contrast. CONTRAST:  7 cc Gadavist COMPARISON:  Ultrasound exam 04/10/2018.  CT scan 06/11/2016. FINDINGS: Study limited by substantial motion artifact. Lower chest: Dependent atelectasis with tiny effusions. Hepatobiliary: T2 imaging shows hyperintensities in the liver parenchyma suggesting cysts, but these are obscured by motion on postcontrast imaging. Enhancing liver masses could be obscured. There is no evidence for gallstones, gallbladder wall thickening, or pericholecystic fluid. No intrahepatic or extrahepatic biliary dilation. Pancreas: Markedly limited assessment. No gross dilatation of the main duct. Spleen:  No gross abnormality. Adrenals/Urinary Tract: Adrenal glands not well evaluated. Bilobed or septated T1 hypointense, T2 hyperintense lesion identified upper pole right kidney measuring approximately 2.9 x 2.1 cm. No gross enhancement evident on postcontrast subtraction imaging but given the motion artifact, subtle enhancement cannot be excluded. Stomach/Bowel: No bowel  dilatation. Vascular/Lymphatic: No abdominal aortic aneurysm. No gross abdominal lymphadenopathy evident. 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 Other:  No substantial intraperitoneal free fluid. Musculoskeletal: No abnormal marrow enhancement evident within the visualized bony anatomy. IMPRESSION: Markedly degraded study due to motion artifact. Portions of the exam are nondiagnostic. 2.9 x 2.1 cm bilobed or septated cyst in the upper pole left kidney without obvious or gross enhancement but assessment limited by motion. Follow-up imaging after resolution of acute symptoms when patient is better able to cooperate with breath holding recommended for definitive assessment. Electronically Signed   By: Misty Stanley M.D.   On: 04/11/2018 12:42   US Renal  Result Date: 04/10/2018 CLINICAL DATA:  Renal failure EXAM: RENAL / URINARY TRACT ULTRASOUND COMPLETE COMPARISON:  None. FINDINGS: Right Kidney: Renal measurements: 8.9 x 4.3 x 4.6 cm = volume: 90.2 mL. Contains a 1.4 x 1.0 x 1.0 cm hypoechoic mass, favored to represent a cyst but not completely characterize. A cyst was not seen in the right kidney on the June 11, 2016 comparison CT the abdomen and pelvis. Left Kidney: Renal measurements: 10 x 4.8 x 5.4 cm = volume: 135.7 mL. The cyst seen off the upper pole the left kidney on the comparison CT scan is not visualized. Bladder: Not visualized due to decompression. IMPRESSION: 1. Cortical thinning bilaterally suggesting medical renal disease. 2. The 1.4 cm mass in the right kidney is favored to represent a cyst but is not determinant on this study and was not seen on the January 2018 CT scan. Recommend an MRI for more definitive characterization. 3. No other abnormalities.  No hydronephrosis. Electronically Signed   By: Dorise Bullion III M.D   On: 04/10/2018 16:51    Micro Results    No results found for this or any previous visit (from the  past 240 hour(s)).  Today   Subjective    Keith Gibson today has no headache,no chest abdominal pain,no new weakness tingling or numbness, feels much better wants to go home today.    Objective   Blood pressure 136/73, pulse 64, temperature 98.1 F (36.7 C), resp. rate 17, height 5\' 6"  (1.676 m), weight 70.8 kg, SpO2 90 %.   Intake/Output Summary (Last 24 hours) at 04/12/2018 0957 Last data filed at 04/12/2018 0759 Gross per 24 hour  Intake 1222.38 ml  Output 1000 ml  Net 222.38 ml    Exam Awake Alert, Oriented x 3, No new F.N deficits, Normal affect [place].AT,PERRAL Supple Neck,No JVD, No cervical lymphadenopathy appriciated.  Symmetrical Chest wall movement, Good air movement bilaterally, CTAB RRR,No Gallops,Rubs or new Murmurs, No Parasternal Heave +ve B.Sounds, Abd Soft, Non tender, No organomegaly appriciated, No rebound -guarding or rigidity. No Cyanosis, Clubbing or edema, No new Rash or bruise   Data Review   CBC w Diff:  Lab Results  Component Value Date   WBC 5.4 04/12/2018   HGB 11.7 (L) 04/12/2018   HCT 35.4 (L) 04/12/2018   PLT 126 (L) 04/12/2018   LYMPHOPCT 13 04/10/2018   MONOPCT 6 04/10/2018   EOSPCT 1 04/10/2018   BASOPCT 1 04/10/2018    CMP:  Lab Results  Component Value Date   NA 138 04/12/2018   K 4.0 04/12/2018   CL 108 04/12/2018   CO2 24 04/12/2018   BUN 21 04/12/2018   CREATININE 1.49 (H) 04/12/2018   PROT 5.1 (L) 04/11/2018   ALBUMIN 3.0 (L) 04/11/2018   BILITOT 1.0 04/11/2018   ALKPHOS 54 04/11/2018   AST 32 04/11/2018   ALT 24 04/11/2018  .  Total Time in preparing paper work, data evaluation and todays exam - 19 minutes  Lala Lund M.D on 04/12/2018 at 9:57 AM  [facility]   Office  [phone]

## 2018-04-12 NOTE — Discharge Instructions (Signed)
Follow with Primary MD Practice, [place] in 7 days, discuss your kidney ultrasound and abdominal MRI report next visit.  Get CBC, CMP  checked  by Primary MD  in 5-7 days   Activity: As tolerated with Full fall precautions use walker/cane & assistance as needed , wear the lumbar brace when out of the bed you can take it off while taking a shower.  Disposition Home   Diet: Heart Healthy    Special Instructions: If you have smoked or chewed Tobacco  in the last 2 yrs please stop smoking, stop any regular Alcohol  and or any Recreational drug use.  On your next visit with your primary care physician please Get Medicines reviewed and adjusted.  Please request your Prim.MD to go over all Hospital Tests and Procedure/Radiological results at the follow up, please get all Hospital records sent to your Prim MD by signing hospital release before you go home.  If you experience worsening of your admission symptoms, develop shortness of breath, life threatening emergency, suicidal or homicidal thoughts you must seek medical attention immediately by calling 911 or calling your MD immediately  if symptoms less severe.  You Must read complete instructions/literature along with all the possible adverse reactions/side effects for all the Medicines you take and that have been prescribed to you. Take any new Medicines after you have completely understood and accpet all the possible adverse reactions/side effects.

## 2018-04-12 NOTE — Progress Notes (Signed)
Physical Therapy Treatment Patient Details Name: Keith Gibson MRN: [id number] DOB: 10/13/1940 Today's Date: 04/12/2018    History of Present Illness Pt is a 77 y.o. M with significant PMH of COPD with prior tobacco use, hyperlipidemia, hypertension, dementia who presents with generalized weakness and a fall. In ED was initially hypotensive and orthostatic.     PT Comments    Patient's tolerance to treatment was good today.  Patient was sitting in chair with family members present upon PT arrival.  Patient and family were educated on how to properly don lumbar corset brace prior to transporting pt to stairs.  Patient required repeated VC with all activities today due to being [facility].  Patient required VC for hand placement on rails and increased time to ascend and descend stairs.  Pt's family was given gait belt at end of treatment with therapist demonstrating how to properly don and adjust size to pt.  Pt has a RW at home and therapist encouraged family to use device with patient.  Patient continues to be a good candidate for HHPT based on current functional status.   Follow Up Recommendations  Home health PT;Supervision for mobility/OOB     Equipment Recommendations  None recommended by PT    Recommendations for Other Services       Precautions / Restrictions Precautions Precautions: Fall Precaution Comments: watch BP Restrictions Weight Bearing Restrictions: No    Mobility  Bed Mobility                  Transfers Overall transfer level: Needs assistance   Transfers: Sit to/from Stand;Stand Pivot Transfers Sit to Stand: Mod assist Stand pivot transfers: Mod assist       General transfer comment: pt required mod A to boost into standing and for steadying from chair.  VC for turning and backing to transport chair for safety.  Pt required mod A with moving B LE towards side of transport chair prior to standing.  Pt also required VC for hand placement with all sit to  stands.  Patient required B HHA via face to face transfer.  Ambulation/Gait Ambulation/Gait assistance: Mod assist   Assistive device: 1 person hand held assist       General Gait Details: 3 trials of 2-3 steps from surface to surface.  Patient completed steps to pivot to and from chair and steps to get closer to stairs with HHA.  Pt probably would require less assistance with RW.   Stairs Stairs: Yes Stairs assistance: Mod assist Stair Management: Two rails;Alternating pattern;Step to pattern;Forwards Number of Stairs: 4 General stair comments: Pt was HOH and required repeated VC for hand placement on rails and ascending/descending stairs.   Wheelchair Mobility    Modified Rankin (Stroke Patients Only)       Balance Overall balance assessment: Needs assistance Sitting-balance support: Feet supported Sitting balance-Leahy Scale: Good     Standing balance support: No upper extremity supported;During functional activity Standing balance-Leahy Scale: Fair                              Cognition Arousal/Alertness: Awake/alert Behavior During Therapy: WFL for tasks assessed/performed Overall Cognitive Status: Difficult to assess  Difficult to assess due to being [facility].                                      Exercises  General Comments        Pertinent Vitals/Pain Pain Assessment: No/denies pain(denies pain at rest; pain in low back with movement) Pain Location: Low back pain with movement Pain Descriptors / Indicators: Grimacing Pain Intervention(s): Monitored during session    Home Living                      Prior Function            PT Goals (current goals can now be found in the care plan section) Acute Rehab PT Goals Patient Stated Goal: none stated PT Goal Formulation: With patient Time For Goal Achievement: 04/24/18 Potential to Achieve Goals: Good Progress towards PT goals: Progressing toward goals     Frequency    Min 3X/week      PT Plan      Co-evaluation              AM-PAC PT "6 Clicks" Mobility   Outcome Measure  Help needed turning from your back to your side while in a flat bed without using bedrails?: None Help needed moving from lying on your back to sitting on the side of a flat bed without using bedrails?: A Little Help needed moving to and from a bed to a chair (including a wheelchair)?: A Little Help needed standing up from a chair using your arms (e.g., wheelchair or bedside chair)?: A Little Help needed to walk in hospital room?: A Little Help needed climbing 3-5 steps with a railing? : A Little 6 Click Score: 19    End of Session Equipment Utilized During Treatment: Gait belt Activity Tolerance: Patient tolerated treatment well Patient left: in chair;with call bell/phone within reach;with family/visitor present Nurse Communication: Mobility status;Other (comment)(orthostatic vital signs and back pain) PT Visit Diagnosis: Unsteadiness on feet (R26.81);Pain;Difficulty in walking, not elsewhere classified (R26.2)     Time: 9476-5465 PT Time Calculation (min) (ACUTE ONLY): 24 min  Charges:  $Gait Training: 8-22 mins $Therapeutic Activity: 8-22 mins                     [address] SPTA   Keith Gibson 04/12/2018, 11:43 AM

## 2018-04-12 NOTE — Plan of Care (Signed)

## 2018-04-12 NOTE — Plan of Care (Signed)
Nsg Discharge Note  Admit Date:  04/10/2018 Discharge date: 04/12/2018   Cori Razor to be D/C'd Home per MD order.  AVS completed.  Copy for chart, and copy for patient signed, and dated. Patient/caregiver able to verbalize understanding.  Discharge Medication: Allergies as of 04/12/2018   No Known Allergies      Medication List     STOP taking these medications    lisinopril 5 MG tablet Commonly known as:  PRINIVIL,ZESTRIL       TAKE these medications    ARIPiprazole 2 MG tablet Commonly known as:  ABILIFY Take 2 mg by mouth daily.   aspirin EC 81 MG tablet Take 81 mg by mouth daily.   atorvastatin 40 MG tablet Commonly known as:  LIPITOR Take 40 mg by mouth daily at 6 PM.   brimonidine 0.1 % Soln Commonly known as:  ALPHAGAN P Place 1 drop into both eyes every 8 (eight) hours.   CENTRUM SILVER 50+MEN PO Take 1 tablet by mouth daily.   OCUVITE ADULT 50+ Caps Take 1 capsule by mouth daily.   Fish Oil 1200 MG Caps Take 1,200 mg by mouth daily.   latanoprost 0.005 % ophthalmic solution Commonly known as:  XALATAN Place 1 drop into the left eye at bedtime.   memantine 10 MG tablet Commonly known as:  NAMENDA Take 1 tablet (10 mg total) by mouth 2 (two) times daily.   potassium chloride 10 MEQ tablet Commonly known as:  K-DUR Take 10 mEq by mouth as needed (as needed for leg cramps).   QUEtiapine 25 MG tablet Commonly known as:  SEROQUEL Take 1 tablet (25 mg total) by mouth 2 (two) times daily.   SYSTANE CONTACTS Soln Place 1 drop into both eyes 3 (three) times daily.   tamsulosin 0.4 MG Caps capsule Commonly known as:  FLOMAX Take 1 capsule (0.4 mg total) by mouth daily.               Durable Medical Equipment  (From admission, onward)           Start     Ordered   04/12/18 1002  For home use only DME Walker rolling  Once    Comments:  5 wheel  Question:  Patient needs a walker to treat with the following condition  Answer:   Falls   04/12/18 1001            Discharge Assessment: Vitals:   04/11/18 2219 04/12/18 0620  BP: 116/73 136/73  Pulse: 70 64  Resp: 15 17  Temp: 98.4 F (36.9 C) 98.1 F (36.7 C)  SpO2: 90% 90%   Skin clean, dry and intact without evidence of skin break down, no evidence of skin tears noted. IV catheter discontinued intact. Site without signs and symptoms of complications - no redness or edema noted at insertion site, patient denies c/o pain - only slight tenderness at site.  Dressing with slight pressure applied.  D/c Instructions-Education: Discharge instructions given to patient/family with verbalized understanding. D/c education completed with patient/family including follow up instructions, medication list, d/c activities limitations if indicated, with other d/c instructions as indicated by MD - patient able to verbalize understanding, all questions fully answered. Patient instructed to return to ED, call 911, or call MD for any changes in condition.  Patient escorted via [place], and D/C home via private auto.  Salley Slaughter, RN 04/12/2018 10:12 AM

## 2018-04-18 ENCOUNTER — Telehealth: Payer: Self-pay | Admitting: Nurse Practitioner

## 2018-04-18 DIAGNOSIS — N179 Acute kidney failure, unspecified: Secondary | ICD-10-CM | POA: Diagnosis not present

## 2018-04-18 DIAGNOSIS — Z6823 Body mass index (BMI) 23.0-23.9, adult: Secondary | ICD-10-CM | POA: Diagnosis not present

## 2018-04-18 DIAGNOSIS — E860 Dehydration: Secondary | ICD-10-CM | POA: Diagnosis not present

## 2018-04-18 NOTE — Telephone Encounter (Signed)
Pt wife(on DPR-[place],[name]) has called stating they just left their PCP and It was suggested pt f/u to see Dr Krista Blue because of an increase in falling and an increase in hand jerks.  Wife is asking for a call

## 2018-04-19 ENCOUNTER — Telehealth: Payer: Self-pay | Admitting: *Deleted

## 2018-04-19 NOTE — Telephone Encounter (Signed)
Spoke to Dr. Vanetta Shawl ([place]) about pt.  She is asking advice / appt for pt.  His dementia worsening, falls, not eating well.  Went to ED after fall, spine fracture.  Dementia with behavioral issues (started seroquel, stopped bp meds, has some jerky movements (? Cogwheeling, no rigidity).  ? Doing MRI.  Asking for sooner appt for pt.  Last seen here 02/2018.    Feels like family needs more information concerning dementia progression.  CM/NP does not have any availability at this time.  May have cancellation and then call.  ? See Dr. Krista Blue.  Please advise.

## 2018-04-19 NOTE — Telephone Encounter (Signed)
He might need some time to recover due to his age, dementia, L2 fracture,   Encourage him to keep well hydration Seroquel for night time agitation and sleep.

## 2018-04-19 NOTE — Telephone Encounter (Signed)
I spoke to Dr. Vanetta Shawl with [place].  See phone note.

## 2018-04-19 NOTE — Telephone Encounter (Signed)
I spoke to Dr. Vanetta Shawl and relayed Dr. Rhea Belton recommendation's of keep well hydrated, using seroquel for agitation and  Sleep.  Due to age, dementia and his L2 fracture, he may just need time to recover.  She appreciated call back. This was a FYI for Dr. Krista Blue.   Will call us back if needed.

## 2018-04-19 NOTE — Telephone Encounter (Signed)
Dr. Krista Blue please review and let me know what else we can do. He was dehydrated on admission, now has L2 fracture from fall. Seroquel started at last visit in Oct. Also hypotensive and B/P meds stopped. On Namenda unable to tolerate Aricept. Was already on Ensure at last visit for poor appetite Thanks

## 2018-04-19 NOTE — Telephone Encounter (Signed)
Please see Dr. Greer Pickerel comments and call Thanks

## 2018-04-20 DIAGNOSIS — N179 Acute kidney failure, unspecified: Secondary | ICD-10-CM | POA: Diagnosis not present

## 2018-04-20 DIAGNOSIS — D638 Anemia in other chronic diseases classified elsewhere: Secondary | ICD-10-CM | POA: Diagnosis not present

## 2018-04-20 DIAGNOSIS — R296 Repeated falls: Secondary | ICD-10-CM | POA: Diagnosis not present

## 2018-04-20 DIAGNOSIS — S32029D Unspecified fracture of second lumbar vertebra, subsequent encounter for fracture with routine healing: Secondary | ICD-10-CM | POA: Diagnosis not present

## 2018-04-20 DIAGNOSIS — M545 Low back pain: Secondary | ICD-10-CM | POA: Diagnosis not present

## 2018-04-20 NOTE — Telephone Encounter (Signed)
Pt wife(on DPR-[place],[name]) has called RN Lovey Newcomer back, she is asking for a call back

## 2018-04-20 NOTE — Telephone Encounter (Signed)
LMVM for wife of pt that returning call.  Did relay that did speak to Dr. Vanetta Shawl yesterday about Jeneen Rinks.

## 2018-04-20 NOTE — Telephone Encounter (Signed)
I spoke to wife of pt.  I relayed that spoke to pts pcp, Dr. Vanetta Shawl. She wanted [place] to be aware of what was going on with pt.  Falls, knees; buckling, going to ED, L2 fracture.  I relayed that notes sent to Dr. Krista Blue and CM/NP and are aware.  Dr. Krista Blue recommended to encourage fluids, seroquel for sleep and agitation.  Time to recover due to L2 fx, dementia, and dehyration.  She verbalized understanding.   Will keep 06-20-18 appt right now.  Will call us back as needed.  Appreciated call back.

## 2018-04-24 DIAGNOSIS — Z87891 Personal history of nicotine dependence: Secondary | ICD-10-CM | POA: Diagnosis not present

## 2018-04-24 DIAGNOSIS — I951 Orthostatic hypotension: Secondary | ICD-10-CM | POA: Diagnosis not present

## 2018-04-24 DIAGNOSIS — H3530 Unspecified macular degeneration: Secondary | ICD-10-CM | POA: Diagnosis not present

## 2018-04-24 DIAGNOSIS — Z9181 History of falling: Secondary | ICD-10-CM | POA: Diagnosis not present

## 2018-04-24 DIAGNOSIS — J449 Chronic obstructive pulmonary disease, unspecified: Secondary | ICD-10-CM | POA: Diagnosis not present

## 2018-04-24 DIAGNOSIS — H4010X0 Unspecified open-angle glaucoma, stage unspecified: Secondary | ICD-10-CM | POA: Diagnosis not present

## 2018-04-24 DIAGNOSIS — N183 Chronic kidney disease, stage 3 (moderate): Secondary | ICD-10-CM | POA: Diagnosis not present

## 2018-04-24 DIAGNOSIS — S32029D Unspecified fracture of second lumbar vertebra, subsequent encounter for fracture with routine healing: Secondary | ICD-10-CM | POA: Diagnosis not present

## 2018-04-24 DIAGNOSIS — I739 Peripheral vascular disease, unspecified: Secondary | ICD-10-CM | POA: Diagnosis not present

## 2018-04-24 DIAGNOSIS — M47812 Spondylosis without myelopathy or radiculopathy, cervical region: Secondary | ICD-10-CM | POA: Diagnosis not present

## 2018-04-24 DIAGNOSIS — I2510 Atherosclerotic heart disease of native coronary artery without angina pectoris: Secondary | ICD-10-CM | POA: Diagnosis not present

## 2018-04-24 DIAGNOSIS — I129 Hypertensive chronic kidney disease with stage 1 through stage 4 chronic kidney disease, or unspecified chronic kidney disease: Secondary | ICD-10-CM | POA: Diagnosis not present

## 2018-04-24 DIAGNOSIS — E860 Dehydration: Secondary | ICD-10-CM | POA: Diagnosis not present

## 2018-04-24 DIAGNOSIS — M4802 Spinal stenosis, cervical region: Secondary | ICD-10-CM | POA: Diagnosis not present

## 2018-04-24 DIAGNOSIS — N2889 Other specified disorders of kidney and ureter: Secondary | ICD-10-CM | POA: Diagnosis not present

## 2018-04-26 DIAGNOSIS — E860 Dehydration: Secondary | ICD-10-CM | POA: Diagnosis not present

## 2018-04-26 DIAGNOSIS — I129 Hypertensive chronic kidney disease with stage 1 through stage 4 chronic kidney disease, or unspecified chronic kidney disease: Secondary | ICD-10-CM | POA: Diagnosis not present

## 2018-04-26 DIAGNOSIS — Z87891 Personal history of nicotine dependence: Secondary | ICD-10-CM | POA: Diagnosis not present

## 2018-04-26 DIAGNOSIS — J449 Chronic obstructive pulmonary disease, unspecified: Secondary | ICD-10-CM | POA: Diagnosis not present

## 2018-04-26 DIAGNOSIS — M4802 Spinal stenosis, cervical region: Secondary | ICD-10-CM | POA: Diagnosis not present

## 2018-04-26 DIAGNOSIS — N2889 Other specified disorders of kidney and ureter: Secondary | ICD-10-CM | POA: Diagnosis not present

## 2018-04-26 DIAGNOSIS — I739 Peripheral vascular disease, unspecified: Secondary | ICD-10-CM | POA: Diagnosis not present

## 2018-04-26 DIAGNOSIS — H3530 Unspecified macular degeneration: Secondary | ICD-10-CM | POA: Diagnosis not present

## 2018-04-26 DIAGNOSIS — H4010X0 Unspecified open-angle glaucoma, stage unspecified: Secondary | ICD-10-CM | POA: Diagnosis not present

## 2018-04-26 DIAGNOSIS — I951 Orthostatic hypotension: Secondary | ICD-10-CM | POA: Diagnosis not present

## 2018-04-26 DIAGNOSIS — Z9181 History of falling: Secondary | ICD-10-CM | POA: Diagnosis not present

## 2018-04-26 DIAGNOSIS — I2510 Atherosclerotic heart disease of native coronary artery without angina pectoris: Secondary | ICD-10-CM | POA: Diagnosis not present

## 2018-04-26 DIAGNOSIS — N183 Chronic kidney disease, stage 3 (moderate): Secondary | ICD-10-CM | POA: Diagnosis not present

## 2018-04-26 DIAGNOSIS — M47812 Spondylosis without myelopathy or radiculopathy, cervical region: Secondary | ICD-10-CM | POA: Diagnosis not present

## 2018-04-26 DIAGNOSIS — S32029D Unspecified fracture of second lumbar vertebra, subsequent encounter for fracture with routine healing: Secondary | ICD-10-CM | POA: Diagnosis not present

## 2018-04-27 DIAGNOSIS — N183 Chronic kidney disease, stage 3 (moderate): Secondary | ICD-10-CM | POA: Diagnosis not present

## 2018-04-27 DIAGNOSIS — H3530 Unspecified macular degeneration: Secondary | ICD-10-CM | POA: Diagnosis not present

## 2018-04-27 DIAGNOSIS — M47812 Spondylosis without myelopathy or radiculopathy, cervical region: Secondary | ICD-10-CM | POA: Diagnosis not present

## 2018-04-27 DIAGNOSIS — N2889 Other specified disorders of kidney and ureter: Secondary | ICD-10-CM | POA: Diagnosis not present

## 2018-04-27 DIAGNOSIS — M4802 Spinal stenosis, cervical region: Secondary | ICD-10-CM | POA: Diagnosis not present

## 2018-04-27 DIAGNOSIS — Z87891 Personal history of nicotine dependence: Secondary | ICD-10-CM | POA: Diagnosis not present

## 2018-04-27 DIAGNOSIS — I2510 Atherosclerotic heart disease of native coronary artery without angina pectoris: Secondary | ICD-10-CM | POA: Diagnosis not present

## 2018-04-27 DIAGNOSIS — E860 Dehydration: Secondary | ICD-10-CM | POA: Diagnosis not present

## 2018-04-27 DIAGNOSIS — H4010X0 Unspecified open-angle glaucoma, stage unspecified: Secondary | ICD-10-CM | POA: Diagnosis not present

## 2018-04-27 DIAGNOSIS — S32029D Unspecified fracture of second lumbar vertebra, subsequent encounter for fracture with routine healing: Secondary | ICD-10-CM | POA: Diagnosis not present

## 2018-04-27 DIAGNOSIS — I739 Peripheral vascular disease, unspecified: Secondary | ICD-10-CM | POA: Diagnosis not present

## 2018-04-27 DIAGNOSIS — I129 Hypertensive chronic kidney disease with stage 1 through stage 4 chronic kidney disease, or unspecified chronic kidney disease: Secondary | ICD-10-CM | POA: Diagnosis not present

## 2018-04-27 DIAGNOSIS — R410 Disorientation, unspecified: Secondary | ICD-10-CM | POA: Diagnosis not present

## 2018-04-27 DIAGNOSIS — Z6823 Body mass index (BMI) 23.0-23.9, adult: Secondary | ICD-10-CM | POA: Diagnosis not present

## 2018-04-27 DIAGNOSIS — G300 Alzheimer's disease with early onset: Secondary | ICD-10-CM | POA: Diagnosis not present

## 2018-04-27 DIAGNOSIS — I951 Orthostatic hypotension: Secondary | ICD-10-CM | POA: Diagnosis not present

## 2018-04-27 DIAGNOSIS — Z9181 History of falling: Secondary | ICD-10-CM | POA: Diagnosis not present

## 2018-04-27 DIAGNOSIS — J449 Chronic obstructive pulmonary disease, unspecified: Secondary | ICD-10-CM | POA: Diagnosis not present

## 2018-04-28 DIAGNOSIS — H3530 Unspecified macular degeneration: Secondary | ICD-10-CM | POA: Diagnosis not present

## 2018-04-28 DIAGNOSIS — M4802 Spinal stenosis, cervical region: Secondary | ICD-10-CM | POA: Diagnosis not present

## 2018-04-28 DIAGNOSIS — Z87891 Personal history of nicotine dependence: Secondary | ICD-10-CM | POA: Diagnosis not present

## 2018-04-28 DIAGNOSIS — J449 Chronic obstructive pulmonary disease, unspecified: Secondary | ICD-10-CM | POA: Diagnosis not present

## 2018-04-28 DIAGNOSIS — I129 Hypertensive chronic kidney disease with stage 1 through stage 4 chronic kidney disease, or unspecified chronic kidney disease: Secondary | ICD-10-CM | POA: Diagnosis not present

## 2018-04-28 DIAGNOSIS — S32029D Unspecified fracture of second lumbar vertebra, subsequent encounter for fracture with routine healing: Secondary | ICD-10-CM | POA: Diagnosis not present

## 2018-04-28 DIAGNOSIS — I2510 Atherosclerotic heart disease of native coronary artery without angina pectoris: Secondary | ICD-10-CM | POA: Diagnosis not present

## 2018-04-28 DIAGNOSIS — I951 Orthostatic hypotension: Secondary | ICD-10-CM | POA: Diagnosis not present

## 2018-04-28 DIAGNOSIS — I739 Peripheral vascular disease, unspecified: Secondary | ICD-10-CM | POA: Diagnosis not present

## 2018-04-28 DIAGNOSIS — N183 Chronic kidney disease, stage 3 (moderate): Secondary | ICD-10-CM | POA: Diagnosis not present

## 2018-04-28 DIAGNOSIS — E860 Dehydration: Secondary | ICD-10-CM | POA: Diagnosis not present

## 2018-04-28 DIAGNOSIS — N2889 Other specified disorders of kidney and ureter: Secondary | ICD-10-CM | POA: Diagnosis not present

## 2018-04-28 DIAGNOSIS — M47812 Spondylosis without myelopathy or radiculopathy, cervical region: Secondary | ICD-10-CM | POA: Diagnosis not present

## 2018-04-28 DIAGNOSIS — H4010X0 Unspecified open-angle glaucoma, stage unspecified: Secondary | ICD-10-CM | POA: Diagnosis not present

## 2018-04-28 DIAGNOSIS — Z9181 History of falling: Secondary | ICD-10-CM | POA: Diagnosis not present

## 2018-04-29 ENCOUNTER — Other Ambulatory Visit: Payer: Self-pay | Admitting: Nurse Practitioner

## 2018-04-29 DIAGNOSIS — Z87891 Personal history of nicotine dependence: Secondary | ICD-10-CM | POA: Diagnosis not present

## 2018-04-29 DIAGNOSIS — H4010X0 Unspecified open-angle glaucoma, stage unspecified: Secondary | ICD-10-CM | POA: Diagnosis not present

## 2018-04-29 DIAGNOSIS — E860 Dehydration: Secondary | ICD-10-CM | POA: Diagnosis not present

## 2018-04-29 DIAGNOSIS — J449 Chronic obstructive pulmonary disease, unspecified: Secondary | ICD-10-CM | POA: Diagnosis not present

## 2018-04-29 DIAGNOSIS — N2889 Other specified disorders of kidney and ureter: Secondary | ICD-10-CM | POA: Diagnosis not present

## 2018-04-29 DIAGNOSIS — M47812 Spondylosis without myelopathy or radiculopathy, cervical region: Secondary | ICD-10-CM | POA: Diagnosis not present

## 2018-04-29 DIAGNOSIS — N183 Chronic kidney disease, stage 3 (moderate): Secondary | ICD-10-CM | POA: Diagnosis not present

## 2018-04-29 DIAGNOSIS — I951 Orthostatic hypotension: Secondary | ICD-10-CM | POA: Diagnosis not present

## 2018-04-29 DIAGNOSIS — I2510 Atherosclerotic heart disease of native coronary artery without angina pectoris: Secondary | ICD-10-CM | POA: Diagnosis not present

## 2018-04-29 DIAGNOSIS — M4802 Spinal stenosis, cervical region: Secondary | ICD-10-CM | POA: Diagnosis not present

## 2018-04-29 DIAGNOSIS — I739 Peripheral vascular disease, unspecified: Secondary | ICD-10-CM | POA: Diagnosis not present

## 2018-04-29 DIAGNOSIS — I129 Hypertensive chronic kidney disease with stage 1 through stage 4 chronic kidney disease, or unspecified chronic kidney disease: Secondary | ICD-10-CM | POA: Diagnosis not present

## 2018-04-29 DIAGNOSIS — Z9181 History of falling: Secondary | ICD-10-CM | POA: Diagnosis not present

## 2018-04-29 DIAGNOSIS — H3530 Unspecified macular degeneration: Secondary | ICD-10-CM | POA: Diagnosis not present

## 2018-04-29 DIAGNOSIS — S32029D Unspecified fracture of second lumbar vertebra, subsequent encounter for fracture with routine healing: Secondary | ICD-10-CM | POA: Diagnosis not present

## 2018-05-02 DIAGNOSIS — S32029D Unspecified fracture of second lumbar vertebra, subsequent encounter for fracture with routine healing: Secondary | ICD-10-CM | POA: Diagnosis not present

## 2018-05-02 DIAGNOSIS — N183 Chronic kidney disease, stage 3 (moderate): Secondary | ICD-10-CM | POA: Diagnosis not present

## 2018-05-02 DIAGNOSIS — H3530 Unspecified macular degeneration: Secondary | ICD-10-CM | POA: Diagnosis not present

## 2018-05-02 DIAGNOSIS — M4802 Spinal stenosis, cervical region: Secondary | ICD-10-CM | POA: Diagnosis not present

## 2018-05-02 DIAGNOSIS — N2889 Other specified disorders of kidney and ureter: Secondary | ICD-10-CM | POA: Diagnosis not present

## 2018-05-02 DIAGNOSIS — E860 Dehydration: Secondary | ICD-10-CM | POA: Diagnosis not present

## 2018-05-02 DIAGNOSIS — I951 Orthostatic hypotension: Secondary | ICD-10-CM | POA: Diagnosis not present

## 2018-05-02 DIAGNOSIS — I129 Hypertensive chronic kidney disease with stage 1 through stage 4 chronic kidney disease, or unspecified chronic kidney disease: Secondary | ICD-10-CM | POA: Diagnosis not present

## 2018-05-02 DIAGNOSIS — H4010X0 Unspecified open-angle glaucoma, stage unspecified: Secondary | ICD-10-CM | POA: Diagnosis not present

## 2018-05-02 DIAGNOSIS — J449 Chronic obstructive pulmonary disease, unspecified: Secondary | ICD-10-CM | POA: Diagnosis not present

## 2018-05-02 DIAGNOSIS — I739 Peripheral vascular disease, unspecified: Secondary | ICD-10-CM | POA: Diagnosis not present

## 2018-05-02 DIAGNOSIS — Z87891 Personal history of nicotine dependence: Secondary | ICD-10-CM | POA: Diagnosis not present

## 2018-05-02 DIAGNOSIS — Z9181 History of falling: Secondary | ICD-10-CM | POA: Diagnosis not present

## 2018-05-02 DIAGNOSIS — I2510 Atherosclerotic heart disease of native coronary artery without angina pectoris: Secondary | ICD-10-CM | POA: Diagnosis not present

## 2018-05-02 DIAGNOSIS — M47812 Spondylosis without myelopathy or radiculopathy, cervical region: Secondary | ICD-10-CM | POA: Diagnosis not present

## 2018-05-03 DIAGNOSIS — I951 Orthostatic hypotension: Secondary | ICD-10-CM | POA: Diagnosis not present

## 2018-05-03 DIAGNOSIS — M47812 Spondylosis without myelopathy or radiculopathy, cervical region: Secondary | ICD-10-CM | POA: Diagnosis not present

## 2018-05-03 DIAGNOSIS — S32029D Unspecified fracture of second lumbar vertebra, subsequent encounter for fracture with routine healing: Secondary | ICD-10-CM | POA: Diagnosis not present

## 2018-05-03 DIAGNOSIS — I129 Hypertensive chronic kidney disease with stage 1 through stage 4 chronic kidney disease, or unspecified chronic kidney disease: Secondary | ICD-10-CM | POA: Diagnosis not present

## 2018-05-03 DIAGNOSIS — H4010X0 Unspecified open-angle glaucoma, stage unspecified: Secondary | ICD-10-CM | POA: Diagnosis not present

## 2018-05-03 DIAGNOSIS — Z87891 Personal history of nicotine dependence: Secondary | ICD-10-CM | POA: Diagnosis not present

## 2018-05-03 DIAGNOSIS — I2510 Atherosclerotic heart disease of native coronary artery without angina pectoris: Secondary | ICD-10-CM | POA: Diagnosis not present

## 2018-05-03 DIAGNOSIS — I739 Peripheral vascular disease, unspecified: Secondary | ICD-10-CM | POA: Diagnosis not present

## 2018-05-03 DIAGNOSIS — J449 Chronic obstructive pulmonary disease, unspecified: Secondary | ICD-10-CM | POA: Diagnosis not present

## 2018-05-03 DIAGNOSIS — N2889 Other specified disorders of kidney and ureter: Secondary | ICD-10-CM | POA: Diagnosis not present

## 2018-05-03 DIAGNOSIS — E860 Dehydration: Secondary | ICD-10-CM | POA: Diagnosis not present

## 2018-05-03 DIAGNOSIS — M4802 Spinal stenosis, cervical region: Secondary | ICD-10-CM | POA: Diagnosis not present

## 2018-05-03 DIAGNOSIS — N183 Chronic kidney disease, stage 3 (moderate): Secondary | ICD-10-CM | POA: Diagnosis not present

## 2018-05-03 DIAGNOSIS — Z9181 History of falling: Secondary | ICD-10-CM | POA: Diagnosis not present

## 2018-05-03 DIAGNOSIS — H3530 Unspecified macular degeneration: Secondary | ICD-10-CM | POA: Diagnosis not present

## 2018-05-04 DIAGNOSIS — M47812 Spondylosis without myelopathy or radiculopathy, cervical region: Secondary | ICD-10-CM | POA: Diagnosis not present

## 2018-05-04 DIAGNOSIS — H4010X0 Unspecified open-angle glaucoma, stage unspecified: Secondary | ICD-10-CM | POA: Diagnosis not present

## 2018-05-04 DIAGNOSIS — H3530 Unspecified macular degeneration: Secondary | ICD-10-CM | POA: Diagnosis not present

## 2018-05-04 DIAGNOSIS — Z87891 Personal history of nicotine dependence: Secondary | ICD-10-CM | POA: Diagnosis not present

## 2018-05-04 DIAGNOSIS — J449 Chronic obstructive pulmonary disease, unspecified: Secondary | ICD-10-CM | POA: Diagnosis not present

## 2018-05-04 DIAGNOSIS — N2889 Other specified disorders of kidney and ureter: Secondary | ICD-10-CM | POA: Diagnosis not present

## 2018-05-04 DIAGNOSIS — I951 Orthostatic hypotension: Secondary | ICD-10-CM | POA: Diagnosis not present

## 2018-05-04 DIAGNOSIS — S32029D Unspecified fracture of second lumbar vertebra, subsequent encounter for fracture with routine healing: Secondary | ICD-10-CM | POA: Diagnosis not present

## 2018-05-04 DIAGNOSIS — I129 Hypertensive chronic kidney disease with stage 1 through stage 4 chronic kidney disease, or unspecified chronic kidney disease: Secondary | ICD-10-CM | POA: Diagnosis not present

## 2018-05-04 DIAGNOSIS — E860 Dehydration: Secondary | ICD-10-CM | POA: Diagnosis not present

## 2018-05-04 DIAGNOSIS — Z9181 History of falling: Secondary | ICD-10-CM | POA: Diagnosis not present

## 2018-05-04 DIAGNOSIS — I739 Peripheral vascular disease, unspecified: Secondary | ICD-10-CM | POA: Diagnosis not present

## 2018-05-04 DIAGNOSIS — N183 Chronic kidney disease, stage 3 (moderate): Secondary | ICD-10-CM | POA: Diagnosis not present

## 2018-05-04 DIAGNOSIS — I2510 Atherosclerotic heart disease of native coronary artery without angina pectoris: Secondary | ICD-10-CM | POA: Diagnosis not present

## 2018-05-04 DIAGNOSIS — M4802 Spinal stenosis, cervical region: Secondary | ICD-10-CM | POA: Diagnosis not present

## 2018-05-06 DIAGNOSIS — I129 Hypertensive chronic kidney disease with stage 1 through stage 4 chronic kidney disease, or unspecified chronic kidney disease: Secondary | ICD-10-CM | POA: Diagnosis not present

## 2018-05-06 DIAGNOSIS — M4802 Spinal stenosis, cervical region: Secondary | ICD-10-CM | POA: Diagnosis not present

## 2018-05-06 DIAGNOSIS — H3530 Unspecified macular degeneration: Secondary | ICD-10-CM | POA: Diagnosis not present

## 2018-05-06 DIAGNOSIS — I951 Orthostatic hypotension: Secondary | ICD-10-CM | POA: Diagnosis not present

## 2018-05-06 DIAGNOSIS — N2889 Other specified disorders of kidney and ureter: Secondary | ICD-10-CM | POA: Diagnosis not present

## 2018-05-06 DIAGNOSIS — J449 Chronic obstructive pulmonary disease, unspecified: Secondary | ICD-10-CM | POA: Diagnosis not present

## 2018-05-06 DIAGNOSIS — I2510 Atherosclerotic heart disease of native coronary artery without angina pectoris: Secondary | ICD-10-CM | POA: Diagnosis not present

## 2018-05-06 DIAGNOSIS — M47812 Spondylosis without myelopathy or radiculopathy, cervical region: Secondary | ICD-10-CM | POA: Diagnosis not present

## 2018-05-06 DIAGNOSIS — S32029D Unspecified fracture of second lumbar vertebra, subsequent encounter for fracture with routine healing: Secondary | ICD-10-CM | POA: Diagnosis not present

## 2018-05-06 DIAGNOSIS — N183 Chronic kidney disease, stage 3 (moderate): Secondary | ICD-10-CM | POA: Diagnosis not present

## 2018-05-06 DIAGNOSIS — I739 Peripheral vascular disease, unspecified: Secondary | ICD-10-CM | POA: Diagnosis not present

## 2018-05-06 DIAGNOSIS — H4010X0 Unspecified open-angle glaucoma, stage unspecified: Secondary | ICD-10-CM | POA: Diagnosis not present

## 2018-05-06 DIAGNOSIS — E860 Dehydration: Secondary | ICD-10-CM | POA: Diagnosis not present

## 2018-05-06 DIAGNOSIS — Z87891 Personal history of nicotine dependence: Secondary | ICD-10-CM | POA: Diagnosis not present

## 2018-05-06 DIAGNOSIS — Z9181 History of falling: Secondary | ICD-10-CM | POA: Diagnosis not present

## 2018-05-09 DIAGNOSIS — I2510 Atherosclerotic heart disease of native coronary artery without angina pectoris: Secondary | ICD-10-CM | POA: Diagnosis not present

## 2018-05-09 DIAGNOSIS — S32029D Unspecified fracture of second lumbar vertebra, subsequent encounter for fracture with routine healing: Secondary | ICD-10-CM | POA: Diagnosis not present

## 2018-05-09 DIAGNOSIS — E860 Dehydration: Secondary | ICD-10-CM | POA: Diagnosis not present

## 2018-05-09 DIAGNOSIS — M47812 Spondylosis without myelopathy or radiculopathy, cervical region: Secondary | ICD-10-CM | POA: Diagnosis not present

## 2018-05-09 DIAGNOSIS — J449 Chronic obstructive pulmonary disease, unspecified: Secondary | ICD-10-CM | POA: Diagnosis not present

## 2018-05-09 DIAGNOSIS — M4802 Spinal stenosis, cervical region: Secondary | ICD-10-CM | POA: Diagnosis not present

## 2018-05-09 DIAGNOSIS — N183 Chronic kidney disease, stage 3 (moderate): Secondary | ICD-10-CM | POA: Diagnosis not present

## 2018-05-09 DIAGNOSIS — Z9181 History of falling: Secondary | ICD-10-CM | POA: Diagnosis not present

## 2018-05-09 DIAGNOSIS — I951 Orthostatic hypotension: Secondary | ICD-10-CM | POA: Diagnosis not present

## 2018-05-09 DIAGNOSIS — I739 Peripheral vascular disease, unspecified: Secondary | ICD-10-CM | POA: Diagnosis not present

## 2018-05-09 DIAGNOSIS — Z87891 Personal history of nicotine dependence: Secondary | ICD-10-CM | POA: Diagnosis not present

## 2018-05-09 DIAGNOSIS — H3530 Unspecified macular degeneration: Secondary | ICD-10-CM | POA: Diagnosis not present

## 2018-05-09 DIAGNOSIS — I129 Hypertensive chronic kidney disease with stage 1 through stage 4 chronic kidney disease, or unspecified chronic kidney disease: Secondary | ICD-10-CM | POA: Diagnosis not present

## 2018-05-09 DIAGNOSIS — H4010X0 Unspecified open-angle glaucoma, stage unspecified: Secondary | ICD-10-CM | POA: Diagnosis not present

## 2018-05-09 DIAGNOSIS — N2889 Other specified disorders of kidney and ureter: Secondary | ICD-10-CM | POA: Diagnosis not present

## 2018-05-10 DIAGNOSIS — I951 Orthostatic hypotension: Secondary | ICD-10-CM | POA: Diagnosis not present

## 2018-05-10 DIAGNOSIS — Z87891 Personal history of nicotine dependence: Secondary | ICD-10-CM | POA: Diagnosis not present

## 2018-05-10 DIAGNOSIS — I129 Hypertensive chronic kidney disease with stage 1 through stage 4 chronic kidney disease, or unspecified chronic kidney disease: Secondary | ICD-10-CM | POA: Diagnosis not present

## 2018-05-10 DIAGNOSIS — Z9181 History of falling: Secondary | ICD-10-CM | POA: Diagnosis not present

## 2018-05-10 DIAGNOSIS — M47812 Spondylosis without myelopathy or radiculopathy, cervical region: Secondary | ICD-10-CM | POA: Diagnosis not present

## 2018-05-10 DIAGNOSIS — S32029D Unspecified fracture of second lumbar vertebra, subsequent encounter for fracture with routine healing: Secondary | ICD-10-CM | POA: Diagnosis not present

## 2018-05-10 DIAGNOSIS — M4802 Spinal stenosis, cervical region: Secondary | ICD-10-CM | POA: Diagnosis not present

## 2018-05-10 DIAGNOSIS — H3530 Unspecified macular degeneration: Secondary | ICD-10-CM | POA: Diagnosis not present

## 2018-05-10 DIAGNOSIS — I2510 Atherosclerotic heart disease of native coronary artery without angina pectoris: Secondary | ICD-10-CM | POA: Diagnosis not present

## 2018-05-10 DIAGNOSIS — J449 Chronic obstructive pulmonary disease, unspecified: Secondary | ICD-10-CM | POA: Diagnosis not present

## 2018-05-10 DIAGNOSIS — I739 Peripheral vascular disease, unspecified: Secondary | ICD-10-CM | POA: Diagnosis not present

## 2018-05-10 DIAGNOSIS — N183 Chronic kidney disease, stage 3 (moderate): Secondary | ICD-10-CM | POA: Diagnosis not present

## 2018-05-10 DIAGNOSIS — E860 Dehydration: Secondary | ICD-10-CM | POA: Diagnosis not present

## 2018-05-10 DIAGNOSIS — N2889 Other specified disorders of kidney and ureter: Secondary | ICD-10-CM | POA: Diagnosis not present

## 2018-05-10 DIAGNOSIS — H4010X0 Unspecified open-angle glaucoma, stage unspecified: Secondary | ICD-10-CM | POA: Diagnosis not present

## 2018-05-13 DIAGNOSIS — J449 Chronic obstructive pulmonary disease, unspecified: Secondary | ICD-10-CM | POA: Diagnosis not present

## 2018-05-13 DIAGNOSIS — I951 Orthostatic hypotension: Secondary | ICD-10-CM | POA: Diagnosis not present

## 2018-05-13 DIAGNOSIS — Z9181 History of falling: Secondary | ICD-10-CM | POA: Diagnosis not present

## 2018-05-13 DIAGNOSIS — M4802 Spinal stenosis, cervical region: Secondary | ICD-10-CM | POA: Diagnosis not present

## 2018-05-13 DIAGNOSIS — H3530 Unspecified macular degeneration: Secondary | ICD-10-CM | POA: Diagnosis not present

## 2018-05-13 DIAGNOSIS — N2889 Other specified disorders of kidney and ureter: Secondary | ICD-10-CM | POA: Diagnosis not present

## 2018-05-13 DIAGNOSIS — N183 Chronic kidney disease, stage 3 (moderate): Secondary | ICD-10-CM | POA: Diagnosis not present

## 2018-05-13 DIAGNOSIS — I739 Peripheral vascular disease, unspecified: Secondary | ICD-10-CM | POA: Diagnosis not present

## 2018-05-13 DIAGNOSIS — E860 Dehydration: Secondary | ICD-10-CM | POA: Diagnosis not present

## 2018-05-13 DIAGNOSIS — S32029D Unspecified fracture of second lumbar vertebra, subsequent encounter for fracture with routine healing: Secondary | ICD-10-CM | POA: Diagnosis not present

## 2018-05-13 DIAGNOSIS — Z87891 Personal history of nicotine dependence: Secondary | ICD-10-CM | POA: Diagnosis not present

## 2018-05-13 DIAGNOSIS — H4010X0 Unspecified open-angle glaucoma, stage unspecified: Secondary | ICD-10-CM | POA: Diagnosis not present

## 2018-05-13 DIAGNOSIS — I2510 Atherosclerotic heart disease of native coronary artery without angina pectoris: Secondary | ICD-10-CM | POA: Diagnosis not present

## 2018-05-13 DIAGNOSIS — I129 Hypertensive chronic kidney disease with stage 1 through stage 4 chronic kidney disease, or unspecified chronic kidney disease: Secondary | ICD-10-CM | POA: Diagnosis not present

## 2018-05-13 DIAGNOSIS — M47812 Spondylosis without myelopathy or radiculopathy, cervical region: Secondary | ICD-10-CM | POA: Diagnosis not present

## 2018-05-16 DIAGNOSIS — M4802 Spinal stenosis, cervical region: Secondary | ICD-10-CM | POA: Diagnosis not present

## 2018-05-16 DIAGNOSIS — H3530 Unspecified macular degeneration: Secondary | ICD-10-CM | POA: Diagnosis not present

## 2018-05-16 DIAGNOSIS — Z9181 History of falling: Secondary | ICD-10-CM | POA: Diagnosis not present

## 2018-05-16 DIAGNOSIS — I739 Peripheral vascular disease, unspecified: Secondary | ICD-10-CM | POA: Diagnosis not present

## 2018-05-16 DIAGNOSIS — J449 Chronic obstructive pulmonary disease, unspecified: Secondary | ICD-10-CM | POA: Diagnosis not present

## 2018-05-16 DIAGNOSIS — Z87891 Personal history of nicotine dependence: Secondary | ICD-10-CM | POA: Diagnosis not present

## 2018-05-16 DIAGNOSIS — I129 Hypertensive chronic kidney disease with stage 1 through stage 4 chronic kidney disease, or unspecified chronic kidney disease: Secondary | ICD-10-CM | POA: Diagnosis not present

## 2018-05-16 DIAGNOSIS — E860 Dehydration: Secondary | ICD-10-CM | POA: Diagnosis not present

## 2018-05-16 DIAGNOSIS — I951 Orthostatic hypotension: Secondary | ICD-10-CM | POA: Diagnosis not present

## 2018-05-16 DIAGNOSIS — H4010X0 Unspecified open-angle glaucoma, stage unspecified: Secondary | ICD-10-CM | POA: Diagnosis not present

## 2018-05-16 DIAGNOSIS — M47812 Spondylosis without myelopathy or radiculopathy, cervical region: Secondary | ICD-10-CM | POA: Diagnosis not present

## 2018-05-16 DIAGNOSIS — N2889 Other specified disorders of kidney and ureter: Secondary | ICD-10-CM | POA: Diagnosis not present

## 2018-05-16 DIAGNOSIS — S32029D Unspecified fracture of second lumbar vertebra, subsequent encounter for fracture with routine healing: Secondary | ICD-10-CM | POA: Diagnosis not present

## 2018-05-16 DIAGNOSIS — N183 Chronic kidney disease, stage 3 (moderate): Secondary | ICD-10-CM | POA: Diagnosis not present

## 2018-05-16 DIAGNOSIS — I2510 Atherosclerotic heart disease of native coronary artery without angina pectoris: Secondary | ICD-10-CM | POA: Diagnosis not present

## 2018-05-18 ENCOUNTER — Other Ambulatory Visit: Payer: Self-pay | Admitting: Nurse Practitioner

## 2018-05-23 DIAGNOSIS — N390 Urinary tract infection, site not specified: Secondary | ICD-10-CM | POA: Diagnosis not present

## 2018-05-24 DIAGNOSIS — S32029D Unspecified fracture of second lumbar vertebra, subsequent encounter for fracture with routine healing: Secondary | ICD-10-CM | POA: Diagnosis not present

## 2018-05-24 DIAGNOSIS — N2889 Other specified disorders of kidney and ureter: Secondary | ICD-10-CM | POA: Diagnosis not present

## 2018-05-24 DIAGNOSIS — H4010X0 Unspecified open-angle glaucoma, stage unspecified: Secondary | ICD-10-CM | POA: Diagnosis not present

## 2018-05-24 DIAGNOSIS — Z87891 Personal history of nicotine dependence: Secondary | ICD-10-CM | POA: Diagnosis not present

## 2018-05-24 DIAGNOSIS — J449 Chronic obstructive pulmonary disease, unspecified: Secondary | ICD-10-CM | POA: Diagnosis not present

## 2018-05-24 DIAGNOSIS — Z9181 History of falling: Secondary | ICD-10-CM | POA: Diagnosis not present

## 2018-05-24 DIAGNOSIS — E860 Dehydration: Secondary | ICD-10-CM | POA: Diagnosis not present

## 2018-05-24 DIAGNOSIS — M4802 Spinal stenosis, cervical region: Secondary | ICD-10-CM | POA: Diagnosis not present

## 2018-05-24 DIAGNOSIS — N183 Chronic kidney disease, stage 3 (moderate): Secondary | ICD-10-CM | POA: Diagnosis not present

## 2018-05-24 DIAGNOSIS — I2510 Atherosclerotic heart disease of native coronary artery without angina pectoris: Secondary | ICD-10-CM | POA: Diagnosis not present

## 2018-05-24 DIAGNOSIS — I129 Hypertensive chronic kidney disease with stage 1 through stage 4 chronic kidney disease, or unspecified chronic kidney disease: Secondary | ICD-10-CM | POA: Diagnosis not present

## 2018-05-24 DIAGNOSIS — I739 Peripheral vascular disease, unspecified: Secondary | ICD-10-CM | POA: Diagnosis not present

## 2018-05-24 DIAGNOSIS — H3530 Unspecified macular degeneration: Secondary | ICD-10-CM | POA: Diagnosis not present

## 2018-05-24 DIAGNOSIS — M47812 Spondylosis without myelopathy or radiculopathy, cervical region: Secondary | ICD-10-CM | POA: Diagnosis not present

## 2018-05-24 DIAGNOSIS — I951 Orthostatic hypotension: Secondary | ICD-10-CM | POA: Diagnosis not present

## 2018-06-01 DIAGNOSIS — N183 Chronic kidney disease, stage 3 (moderate): Secondary | ICD-10-CM | POA: Diagnosis not present

## 2018-06-01 DIAGNOSIS — J449 Chronic obstructive pulmonary disease, unspecified: Secondary | ICD-10-CM | POA: Diagnosis not present

## 2018-06-01 DIAGNOSIS — M47812 Spondylosis without myelopathy or radiculopathy, cervical region: Secondary | ICD-10-CM | POA: Diagnosis not present

## 2018-06-01 DIAGNOSIS — H3530 Unspecified macular degeneration: Secondary | ICD-10-CM | POA: Diagnosis not present

## 2018-06-01 DIAGNOSIS — Z9181 History of falling: Secondary | ICD-10-CM | POA: Diagnosis not present

## 2018-06-01 DIAGNOSIS — S32029D Unspecified fracture of second lumbar vertebra, subsequent encounter for fracture with routine healing: Secondary | ICD-10-CM | POA: Diagnosis not present

## 2018-06-01 DIAGNOSIS — H4010X0 Unspecified open-angle glaucoma, stage unspecified: Secondary | ICD-10-CM | POA: Diagnosis not present

## 2018-06-01 DIAGNOSIS — E860 Dehydration: Secondary | ICD-10-CM | POA: Diagnosis not present

## 2018-06-01 DIAGNOSIS — Z87891 Personal history of nicotine dependence: Secondary | ICD-10-CM | POA: Diagnosis not present

## 2018-06-01 DIAGNOSIS — M4802 Spinal stenosis, cervical region: Secondary | ICD-10-CM | POA: Diagnosis not present

## 2018-06-01 DIAGNOSIS — I129 Hypertensive chronic kidney disease with stage 1 through stage 4 chronic kidney disease, or unspecified chronic kidney disease: Secondary | ICD-10-CM | POA: Diagnosis not present

## 2018-06-01 DIAGNOSIS — I739 Peripheral vascular disease, unspecified: Secondary | ICD-10-CM | POA: Diagnosis not present

## 2018-06-01 DIAGNOSIS — I2510 Atherosclerotic heart disease of native coronary artery without angina pectoris: Secondary | ICD-10-CM | POA: Diagnosis not present

## 2018-06-01 DIAGNOSIS — N2889 Other specified disorders of kidney and ureter: Secondary | ICD-10-CM | POA: Diagnosis not present

## 2018-06-01 DIAGNOSIS — I951 Orthostatic hypotension: Secondary | ICD-10-CM | POA: Diagnosis not present

## 2018-06-06 DIAGNOSIS — J449 Chronic obstructive pulmonary disease, unspecified: Secondary | ICD-10-CM | POA: Diagnosis not present

## 2018-06-06 DIAGNOSIS — G301 Alzheimer's disease with late onset: Secondary | ICD-10-CM | POA: Diagnosis not present

## 2018-06-06 DIAGNOSIS — I10 Essential (primary) hypertension: Secondary | ICD-10-CM | POA: Diagnosis not present

## 2018-06-09 DIAGNOSIS — Z79899 Other long term (current) drug therapy: Secondary | ICD-10-CM | POA: Diagnosis not present

## 2018-06-20 ENCOUNTER — Ambulatory Visit: Payer: Self-pay | Admitting: Nurse Practitioner

## 2018-06-20 ENCOUNTER — Ambulatory Visit: Payer: Self-pay | Admitting: Neurology

## 2018-06-22 ENCOUNTER — Encounter (HOSPITAL_COMMUNITY): Payer: Self-pay | Admitting: Emergency Medicine

## 2018-06-22 ENCOUNTER — Other Ambulatory Visit: Payer: Self-pay

## 2018-06-22 ENCOUNTER — Emergency Department (HOSPITAL_COMMUNITY): Payer: Medicare Other

## 2018-06-22 ENCOUNTER — Emergency Department (HOSPITAL_COMMUNITY)
Admission: EM | Admit: 2018-06-22 | Discharge: 2018-06-22 | Disposition: A | Discharge status: Home / Self Care | Payer: Medicare Other | Attending: Emergency Medicine | Admitting: Emergency Medicine

## 2018-06-22 DIAGNOSIS — Z87891 Personal history of nicotine dependence: Secondary | ICD-10-CM | POA: Diagnosis not present

## 2018-06-22 DIAGNOSIS — Z8673 Personal history of transient ischemic attack (TIA), and cerebral infarction without residual deficits: Secondary | ICD-10-CM | POA: Insufficient documentation

## 2018-06-22 DIAGNOSIS — J449 Chronic obstructive pulmonary disease, unspecified: Secondary | ICD-10-CM | POA: Diagnosis not present

## 2018-06-22 DIAGNOSIS — I10 Essential (primary) hypertension: Secondary | ICD-10-CM | POA: Insufficient documentation

## 2018-06-22 DIAGNOSIS — R0902 Hypoxemia: Secondary | ICD-10-CM | POA: Diagnosis not present

## 2018-06-22 DIAGNOSIS — Z79899 Other long term (current) drug therapy: Secondary | ICD-10-CM | POA: Insufficient documentation

## 2018-06-22 DIAGNOSIS — R4182 Altered mental status, unspecified: Secondary | ICD-10-CM | POA: Insufficient documentation

## 2018-06-22 DIAGNOSIS — R4020 Unspecified coma: Secondary | ICD-10-CM | POA: Diagnosis not present

## 2018-06-22 DIAGNOSIS — M2550 Pain in unspecified joint: Secondary | ICD-10-CM | POA: Diagnosis not present

## 2018-06-22 DIAGNOSIS — F0390 Unspecified dementia without behavioral disturbance: Secondary | ICD-10-CM | POA: Diagnosis not present

## 2018-06-22 DIAGNOSIS — R404 Transient alteration of awareness: Secondary | ICD-10-CM | POA: Diagnosis not present

## 2018-06-22 DIAGNOSIS — Z7401 Bed confinement status: Secondary | ICD-10-CM | POA: Diagnosis not present

## 2018-06-22 LAB — URINALYSIS, ROUTINE W REFLEX MICROSCOPIC
Bilirubin Urine: NEGATIVE
Glucose, UA: NEGATIVE mg/dL
Hgb urine dipstick: NEGATIVE
Ketones, ur: NEGATIVE mg/dL
LEUKOCYTES UA: NEGATIVE
Nitrite: NEGATIVE
PROTEIN: NEGATIVE mg/dL
Specific Gravity, Urine: 1.021 (ref 1.005–1.030)
pH: 5 (ref 5.0–8.0)

## 2018-06-22 LAB — BASIC METABOLIC PANEL
ANION GAP: 5 (ref 5–15)
BUN: 37 mg/dL — AB (ref 8–23)
CO2: 27 mmol/L (ref 22–32)
Calcium: 9.1 mg/dL (ref 8.9–10.3)
Chloride: 108 mmol/L (ref 98–111)
Creatinine, Ser: 1.79 mg/dL — ABNORMAL HIGH (ref 0.61–1.24)
GFR calc Af Amer: 41 mL/min — ABNORMAL LOW (ref >60)
GFR, EST NON AFRICAN AMERICAN: 36 mL/min — AB (ref >60)
Glucose, Bld: 108 mg/dL — ABNORMAL HIGH (ref 70–99)
POTASSIUM: 4.7 mmol/L (ref 3.5–5.1)
SODIUM: 140 mmol/L (ref 135–145)

## 2018-06-22 LAB — CBC WITH DIFFERENTIAL/PLATELET
ABS IMMATURE GRANULOCYTES: 0.03 10*3/uL (ref 0.00–0.07)
BASOS PCT: 1 %
Basophils Absolute: 0 10*3/uL (ref 0.0–0.1)
Eosinophils Absolute: 0 10*3/uL (ref 0.0–0.5)
Eosinophils Relative: 1 %
HCT: 39.1 % (ref 39.0–52.0)
Hemoglobin: 12.4 g/dL — ABNORMAL LOW (ref 13.0–17.0)
IMMATURE GRANULOCYTES: 1 %
Lymphocytes Relative: 13 %
Lymphs Abs: 0.8 10*3/uL (ref 0.7–4.0)
MCH: 30.1 pg (ref 26.0–34.0)
MCHC: 31.7 g/dL (ref 30.0–36.0)
MCV: 94.9 fL (ref 80.0–100.0)
Monocytes Absolute: 0.5 10*3/uL (ref 0.1–1.0)
Monocytes Relative: 8 %
NEUTROS PCT: 76 %
Neutro Abs: 4.9 10*3/uL (ref 1.7–7.7)
PLATELETS: 154 10*3/uL (ref 150–400)
RBC: 4.12 MIL/uL — AB (ref 4.22–5.81)
RDW: 13.7 % (ref 11.5–15.5)
WBC: 6.3 10*3/uL (ref 4.0–10.5)
nRBC: 0 % (ref 0.0–0.2)

## 2018-06-22 NOTE — ED Notes (Signed)
Patient verbalizes understanding of discharge instructions. Opportunity for questioning and answers were provided. Armband removed by staff, pt discharged from ED by PTAR   

## 2018-06-22 NOTE — ED Triage Notes (Signed)
Pt coming from assisted living after wife came to visit today and noticed he was much less responsive than normal. Dementia at baseline. CBG 141. Pt unable to answer questions or follow commands. Holding eyes shut. Vitals stable

## 2018-06-22 NOTE — Discharge Instructions (Signed)
Work-up was unremarkable.  Patient likely with dementia related changes.  Continue current management.

## 2018-06-22 NOTE — ED Provider Notes (Signed)
[place] EMERGENCY DEPARTMENT Provider Note   CSN: [id number] Arrival date & time: 06/22/18  1931     History   Chief Complaint Chief Complaint  Patient presents with  . Altered Mental Status    HPI Keith Gibson is a 78 y.o. male.  Level 5 caveat due to dementia history is provided by patient's wife  The history is provided by a caregiver.  Altered Mental Status  Presenting symptoms: partial responsiveness   Severity:  Mild Episode history:  Multiple Timing:  Intermittent Progression:  Waxing and waning Chronicity:  Recurrent Context: dementia     Past Medical History:  Diagnosis Date  . 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 Cataract    bil removed  . Chronic kidney disease    low functioning kidney disease  . Complication of anesthesia 05/2016   pt was hard to wake up, then when he woke up he became viloent.  Marland Kitchen COPD (chronic obstructive pulmonary disease) ([place])   . Glaucoma   . Hyperlipidemia   . Hypertension   . Memory loss   . 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 Shortness of breath   . Stroke ([place])    2015  . Wears dentures    full top-partial bottom  . Wears glasses   . Wears hearing aid    both ears    Patient Active Problem List   Diagnosis Date Noted  . Renal mass 04/11/2018  . AKI (acute kidney injury) ([place]) 04/10/2018  . Hallucinations, unspecified 03/09/2018  . Dementia ([place]) 07/07/2016  . Mesenteric ischemia ([place]) 06/01/2016  . Pre-op evaluation 05/01/2016  . Personal history of adenomatous  colonic polyps 03/22/2012    Past Surgical History:  Procedure Laterality Date  . 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 APPENDECTOMY     patient denies  . CAROTID ENDARTERECTOMY Right 05/08/2014  . COLONOSCOPY    . EYE SURGERY Bilateral     laser, cataract surgery  . KNEE SURGERY  1990   arthroscopic on right knee  . MESENTERIC ARTERY BYPASS N/A 06/01/2016   Procedure: AORTO SUPERIOR MESENTERIC AND CELIAC ARTERY BYPASS;  Surgeon: Elam Dutch, MD;  Location: [facility];  Service: Vascular;  Laterality: N/A;  . PERIPHERAL VASCULAR CATHETERIZATION N/A 04/10/2016   Procedure: Abdominal Aortogram;  Surgeon: Elam Dutch, MD;  Location: [facility];  Service: Cardiovascular;  Laterality: N/A;  . PERIPHERAL VASCULAR CATHETERIZATION Bilateral 04/10/2016   Procedure: Lower Extremity Angiography;  Surgeon: Elam Dutch, MD;  Location: [place] [facility];  Service: Cardiovascular;  Laterality: Bilateral;  . SHOULDER ARTHROSCOPY WITH ROTATOR CUFF REPAIR AND SUBACROMIAL DECOMPRESSION Right 08/30/2012   Procedure: RIGHT SHOULDER ARTHROSCOPY WITH SUBACROMIAL DECOMPRESSION, DISTAL CLAVICLE RESECTION AND ROTATOR CUFF REPAIR;  Surgeon: Cammie Sickle., MD;  Location: [facility];  Service: Orthopedics;  Laterality: Right;  . TONSILLECTOMY    . vocal cord surg  1992   removed precancerous polyps        Home Medications    Prior to Admission medications   Medication Sig Start Date End Date Taking? Authorizing Provider  atorvastatin (LIPITOR) 40 MG tablet Take 40 mg by mouth at bedtime.  11/16/16  Yes [provider]  brimonidine (ALPHAGAN P) 0.1 % SOLN Place 1 drop into both eyes 3 (three) times daily. Wait 3-5 min. Between different eye drops   Yes [provider]  haloperidol (HALDOL) 2 MG tablet Take 2 mg by mouth at bedtime. For Agitation   Yes [provider]  haloperidol (HALDOL) 2 MG tablet Take 2 mg by mouth See admin instructions. Take 1 tablet by mouth every 4 hours as needed for agitation. Take 1 tablet by mouth every evening as needed (1-4 hours after scheduled dose if  needed)   Yes [provider]  latanoprost (XALATAN) 0.005 % ophthalmic solution Place 1 drop into the left eye at bedtime. 02/27/16  Yes [provider]  lisinopril (PRINIVIL,ZESTRIL) 10 MG tablet Take 5 mg by mouth daily.   Yes [provider]  loperamide (ANTI-DIARRHEAL) 2 MG tablet Take 2-4 mg by mouth See admin instructions. Take 2 tablet by mouth after first unformed stool, take 1 tablet by mouth after each additional unformed stool *Do not exceed 16mg  in 24 hours*   Yes [provider]  memantine (NAMENDA) 10 MG tablet TAKE 1 TABLET BY MOUTH TWICE A DAY Patient taking differently: Take 10 mg by mouth 2 (two) times daily.  04/29/18  Yes Dennie Bible, NP  QUEtiapine (SEROQUEL) 25 MG tablet TAKE 1 TABLET BY MOUTH TWICE A DAY Patient taking differently: Take 25-50 mg by mouth See admin instructions. Take 1 tablet by mouth at 8am and Take 2 tablets by mouth at 5pm 05/19/18  Yes Dennie Bible, NP  senna-docusate (SENNA-PLUS) 8.6-50 MG tablet Take 2 tablets by mouth 2 (two) times daily as needed for mild constipation.   Yes [provider]  tamsulosin (FLOMAX) 0.4 MG CAPS capsule Take 1 capsule (0.4 mg total) by mouth daily. Patient not taking: Reported on 06/22/2018 04/12/18   Thurnell Lose, MD    Family History Family History  Problem Relation Age of Onset  . Heart Problems Mother   . Other Father        mining accident  . Colon cancer Neg Hx     Social History Social History   Tobacco Use  . Smoking status: Former Smoker    Types: Cigarettes    Last attempt to quit: 03/08/1997    Years since quitting: 21.3  . Smokeless tobacco: Never Used  Substance Use Topics  . Alcohol use: No  . Drug use: No     Allergies   Codeine   Review of Systems Review of Systems  Unable to perform ROS: Dementia     Physical Exam Updated Vital Signs BP 99/67   Pulse 69   Temp 98.4 F (36.9 C) (Rectal)   Resp 12   Ht 5\' 6"   (1.676 m)   Wt 61.2 kg   SpO2 97%   BMI 21.79 kg/m   Physical Exam Constitutional:      General: He is not in acute distress.    Appearance: He is not ill-appearing.  HENT:     Right Ear: Tympanic membrane normal.     Left Ear: Tympanic membrane normal.     Nose: Nose normal.     Mouth/Throat:     Mouth: Mucous membranes are moist.  Eyes:     Extraocular Movements: Extraocular movements intact.     Conjunctiva/sclera: Conjunctivae normal.     Pupils: Pupils are equal, round, and reactive to light.  Neck:     Musculoskeletal: Neck  supple.  Cardiovascular:     Pulses: Normal pulses.     Heart sounds: Normal heart sounds.  Pulmonary:     Effort: Pulmonary effort is normal. No respiratory distress.     Breath sounds: No wheezing.  Abdominal:     General: There is no distension.     Tenderness: There is no abdominal tenderness.  Musculoskeletal:     Right lower leg: No edema.  Skin:    General: Skin is warm.     Capillary Refill: Capillary refill takes less than 2 seconds.  Neurological:     General: No focal deficit present.     Comments: Patient moves all extremities, follows commands, does not open eyes spontaneously or talk      ED Treatments / Results  Labs (all labs ordered are listed, but only abnormal results are displayed) Labs Reviewed  CBC WITH DIFFERENTIAL/PLATELET - Abnormal; Notable for the following components:      Result Value   RBC 4.12 (*)    Hemoglobin 12.4 (*)    All other components within normal limits  BASIC METABOLIC PANEL - Abnormal; Notable for the following components:   Glucose, Bld 108 (*)    BUN 37 (*)    Creatinine, Ser 1.79 (*)    GFR calc non Af Amer 36 (*)    GFR calc Af Amer 41 (*)    All other components within normal limits  URINALYSIS, ROUTINE W REFLEX MICROSCOPIC    EKG None  Radiology Ct Head Wo Contrast  Result Date: 06/22/2018 CLINICAL DATA:  Altered level of consciousness EXAM: CT HEAD WITHOUT CONTRAST  TECHNIQUE: Contiguous axial images were obtained from the base of the skull through the vertex without intravenous contrast. COMPARISON:  None. FINDINGS: Brain: No evidence of acute infarction, hemorrhage, extra-axial collection, ventriculomegaly, or mass effect. Generalized cerebral atrophy. Periventricular white matter low attenuation likely secondary to microangiopathy. Vascular: Cerebrovascular atherosclerotic calcifications are noted. Skull: Negative for fracture or focal lesion. Sinuses/Orbits: Visualized portions of the orbits are unremarkable. Visualized portions of the paranasal sinuses and mastoid air cells are unremarkable. Other: None. IMPRESSION: No acute intracranial pathology. Electronically Signed   By: Kathreen Devoid   On: 06/22/2018 21:05   Dg Chest Portable 1 View  Result Date: 06/22/2018 CLINICAL DATA:  Altered mental status EXAM: PORTABLE CHEST 1 VIEW COMPARISON:  This 04/10/2018 FINDINGS: There is no focal consolidation. There is bilateral chronic interstitial thickening. There is no pleural effusion or pneumothorax. The heart and mediastinal contours are unremarkable. The osseous structures are unremarkable. IMPRESSION: No active disease. Electronically Signed   By: Kathreen Devoid   On: 06/22/2018 21:16    Procedures Procedures (including critical care time)  Medications Ordered in ED Medications - No data to display   Initial Impression / Assessment and Plan / ED Course  I have reviewed the triage vital signs and the nursing notes.  Pertinent labs & imaging results that were available during my care of the patient were reviewed by me and considered in my medical decision making (see chart for details).     Keith Gibson is a 78 year old male with history of CKD, dementia who presents to the ED with change in his mental status.  Patient with normal vitals.  No fever.  Wife is at the bedside and states that patient appears to be at his sometimes baseline.  She states that he  intermittently has sundowning.  Will he will go to bed early and not wake up.  This  afternoon she said that he was able to eat well with her.  He had a late lunch.  He refused to eat dinner tonight and went to bed.  Nursing staff is nervous because patient appeared to be more sleepy than normal.  Overall patient has normal pupils.  He moves all extremities.  He follows commands.  However it appears that he is sleeping.  Exam is overall unremarkable.  Lab work showed no signs of urinary tract infection, no pneumonia.  CT scan of his head was unremarkable.  No significant anemia, electrolyte abnormality.  Creatinine at baseline.  Overall work-up is reassuring.  Wife states that this is not abnormal for the patient and feels comfortable with discharge back to home.  No concern for new process.  Likely dementia related symptoms.  Patient is a DNR and wife would like discharge back to facility.  Patient was discharged in good condition.  Given return precautions.  This chart was dictated using voice recognition software.  Despite best efforts to proofread,  errors can occur which can change the documentation meaning.    Final Clinical Impressions(s) / ED Diagnoses   Final diagnoses:  Altered mental status, unspecified altered mental status type    ED Discharge Orders    None       Lennice Sites, DO 06/22/18 2319

## 2018-06-22 NOTE — ED Notes (Signed)
Condom cath placed.

## 2018-06-27 DIAGNOSIS — Z79899 Other long term (current) drug therapy: Secondary | ICD-10-CM | POA: Diagnosis not present

## 2018-06-27 DIAGNOSIS — R451 Restlessness and agitation: Secondary | ICD-10-CM | POA: Diagnosis not present

## 2018-06-27 DIAGNOSIS — Z09 Encounter for follow-up examination after completed treatment for conditions other than malignant neoplasm: Secondary | ICD-10-CM | POA: Diagnosis not present

## 2018-06-27 DIAGNOSIS — I10 Essential (primary) hypertension: Secondary | ICD-10-CM | POA: Diagnosis not present

## 2018-06-27 DIAGNOSIS — M545 Low back pain: Secondary | ICD-10-CM | POA: Diagnosis not present

## 2018-07-04 DIAGNOSIS — I10 Essential (primary) hypertension: Secondary | ICD-10-CM | POA: Diagnosis not present

## 2018-07-04 DIAGNOSIS — G300 Alzheimer's disease with early onset: Secondary | ICD-10-CM | POA: Diagnosis not present

## 2018-07-04 DIAGNOSIS — R230 Cyanosis: Secondary | ICD-10-CM | POA: Diagnosis not present

## 2018-07-04 DIAGNOSIS — R451 Restlessness and agitation: Secondary | ICD-10-CM | POA: Diagnosis not present

## 2018-07-15 ENCOUNTER — Emergency Department (HOSPITAL_COMMUNITY): Payer: Medicare Other

## 2018-07-15 ENCOUNTER — Emergency Department (HOSPITAL_COMMUNITY)
Admission: EM | Admit: 2018-07-15 | Discharge: 2018-07-15 | Disposition: A | Discharge status: Home / Self Care | Payer: Medicare Other | Attending: Emergency Medicine | Admitting: Emergency Medicine

## 2018-07-15 DIAGNOSIS — R0902 Hypoxemia: Secondary | ICD-10-CM | POA: Diagnosis not present

## 2018-07-15 DIAGNOSIS — R531 Weakness: Secondary | ICD-10-CM | POA: Diagnosis not present

## 2018-07-15 DIAGNOSIS — Z87891 Personal history of nicotine dependence: Secondary | ICD-10-CM | POA: Diagnosis not present

## 2018-07-15 DIAGNOSIS — R400 Somnolence: Secondary | ICD-10-CM | POA: Insufficient documentation

## 2018-07-15 DIAGNOSIS — R404 Transient alteration of awareness: Secondary | ICD-10-CM | POA: Diagnosis not present

## 2018-07-15 DIAGNOSIS — R9431 Abnormal electrocardiogram [ECG] [EKG]: Secondary | ICD-10-CM | POA: Diagnosis not present

## 2018-07-15 DIAGNOSIS — R000 Tachycardia, unspecified: Secondary | ICD-10-CM | POA: Diagnosis not present

## 2018-07-15 DIAGNOSIS — F0390 Unspecified dementia without behavioral disturbance: Secondary | ICD-10-CM | POA: Diagnosis not present

## 2018-07-15 DIAGNOSIS — Z79899 Other long term (current) drug therapy: Secondary | ICD-10-CM | POA: Diagnosis not present

## 2018-07-15 DIAGNOSIS — J449 Chronic obstructive pulmonary disease, unspecified: Secondary | ICD-10-CM | POA: Diagnosis not present

## 2018-07-15 DIAGNOSIS — Z8673 Personal history of transient ischemic attack (TIA), and cerebral infarction without residual deficits: Secondary | ICD-10-CM | POA: Insufficient documentation

## 2018-07-15 DIAGNOSIS — R4182 Altered mental status, unspecified: Secondary | ICD-10-CM | POA: Diagnosis present

## 2018-07-15 DIAGNOSIS — I129 Hypertensive chronic kidney disease with stage 1 through stage 4 chronic kidney disease, or unspecified chronic kidney disease: Secondary | ICD-10-CM | POA: Insufficient documentation

## 2018-07-15 DIAGNOSIS — R4020 Unspecified coma: Secondary | ICD-10-CM | POA: Diagnosis not present

## 2018-07-15 DIAGNOSIS — N189 Chronic kidney disease, unspecified: Secondary | ICD-10-CM | POA: Insufficient documentation

## 2018-07-15 DIAGNOSIS — Z86010 Personal history of colonic polyps: Secondary | ICD-10-CM | POA: Insufficient documentation

## 2018-07-15 DIAGNOSIS — Z89421 Acquired absence of other right toe(s): Secondary | ICD-10-CM | POA: Diagnosis not present

## 2018-07-15 LAB — CBC WITH DIFFERENTIAL/PLATELET
Abs Immature Granulocytes: 0.02 10*3/uL (ref 0.00–0.07)
Basophils Absolute: 0 10*3/uL (ref 0.0–0.1)
Basophils Relative: 1 %
EOS ABS: 0 10*3/uL (ref 0.0–0.5)
Eosinophils Relative: 1 %
HEMATOCRIT: 42.2 % (ref 39.0–52.0)
Hemoglobin: 13.6 g/dL (ref 13.0–17.0)
Immature Granulocytes: 0 %
LYMPHS ABS: 1.2 10*3/uL (ref 0.7–4.0)
Lymphocytes Relative: 18 %
MCH: 30 pg (ref 26.0–34.0)
MCHC: 32.2 g/dL (ref 30.0–36.0)
MCV: 93.2 fL (ref 80.0–100.0)
MONOS PCT: 8 %
Monocytes Absolute: 0.5 10*3/uL (ref 0.1–1.0)
NRBC: 0 % (ref 0.0–0.2)
Neutro Abs: 4.8 10*3/uL (ref 1.7–7.7)
Neutrophils Relative %: 72 %
Platelets: 178 10*3/uL (ref 150–400)
RBC: 4.53 MIL/uL (ref 4.22–5.81)
RDW: 13.2 % (ref 11.5–15.5)
WBC: 6.6 10*3/uL (ref 4.0–10.5)

## 2018-07-15 LAB — BASIC METABOLIC PANEL
Anion gap: 8 (ref 5–15)
BUN: 43 mg/dL — ABNORMAL HIGH (ref 8–23)
CHLORIDE: 105 mmol/L (ref 98–111)
CO2: 23 mmol/L (ref 22–32)
Calcium: 9.6 mg/dL (ref 8.9–10.3)
Creatinine, Ser: 2.21 mg/dL — ABNORMAL HIGH (ref 0.61–1.24)
GFR calc Af Amer: 32 mL/min — ABNORMAL LOW (ref >60)
GFR calc non Af Amer: 27 mL/min — ABNORMAL LOW (ref >60)
Glucose, Bld: 100 mg/dL — ABNORMAL HIGH (ref 70–99)
Potassium: 4.8 mmol/L (ref 3.5–5.1)
Sodium: 136 mmol/L (ref 135–145)

## 2018-07-15 MED ORDER — NALOXONE HCL 0.4 MG/ML IJ SOLN
0.4000 mg | Freq: Once | INTRAMUSCULAR | Status: AC
  Administered 2018-07-15: 0.4 mg via INTRAVENOUS
  Filled 2018-07-15: qty 1

## 2018-07-15 NOTE — Discharge Instructions (Addendum)
Talk to Keith Gibson Doctor about lowering his nighttime Seroquel.  He should come back to the hospital if he does not awake tomorrow

## 2018-07-15 NOTE — ED Notes (Signed)
Patient verbalizes understanding of discharge instructions. Opportunity for questioning and answers were provided. Armband removed by staff, pt discharged from ED by PTAR   

## 2018-07-15 NOTE — ED Provider Notes (Addendum)
[facility] EMERGENCY DEPARTMENT Provider Note   CSN: [id number] Arrival date & time: 07/15/18  2013    History   Chief Complaint Chief Complaint  Patient presents with  . Altered Mental Status    HPI Keith Gibson is a 78 y.o. male.     The patient has severe dementia and a DNR living will.  According to his wife, the patient always takes Seroquel after dinner and goes to bed and cannot be aroused till the morning.  He usually sleeps till 8:00 or later.  She states he cannot wake him up when he takes the Seroquel.  This is been going on for 6 months.  Tonight he was given his Seroquel and when the nurses at the assisted living went to see him he was sitting in his chair but was unresponsive so they sent him to the hospital.  The history is provided by a relative.  Altered Mental Status  Presenting symptoms: behavior changes   Severity:  Severe Most recent episode:  Today Episode history:  Multiple Timing:  Constant Progression:  Unchanged Chronicity:  Recurrent Context: dementia   Associated symptoms: no abdominal pain     Past Medical History:  Diagnosis Date  . 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 Cataract    bil removed  . Chronic kidney disease    low functioning kidney disease  . Complication of anesthesia 05/2016   pt was hard to wake up, then when he woke up he became viloent.  Marland Kitchen COPD (chronic obstructive pulmonary disease) ([place])   . Glaucoma   . Hyperlipidemia   . Hypertension   . Memory loss   . 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 Shortness of breath   . Stroke ([place])    2015  . Wears dentures    full top-partial bottom  . Wears glasses   . Wears hearing aid    both ears    Patient Active Problem List   Diagnosis Date Noted  . Renal mass 04/11/2018  . AKI (acute kidney injury) ([place])  04/10/2018  . Hallucinations, unspecified 03/09/2018  . Dementia ([place]) 07/07/2016  . Mesenteric ischemia ([place]) 06/01/2016  . Pre-op evaluation 05/01/2016  . Personal history of adenomatous  colonic polyps 03/22/2012    Past Surgical History:  Procedure Laterality Date  . 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 APPENDECTOMY     patient denies  . CAROTID ENDARTERECTOMY Right 05/08/2014  . COLONOSCOPY    . EYE SURGERY Bilateral     laser, cataract surgery  . KNEE SURGERY  1990   arthroscopic on right knee  . MESENTERIC ARTERY BYPASS N/A 06/01/2016   Procedure: AORTO SUPERIOR MESENTERIC AND CELIAC ARTERY BYPASS;  Surgeon: Elam Dutch, MD;  Location: [facility];  Service: Vascular;  Laterality: N/A;  . PERIPHERAL VASCULAR CATHETERIZATION N/A 04/10/2016   Procedure: Abdominal Aortogram;  Surgeon: Elam Dutch, MD;  Location: [facility];  Service: Cardiovascular;  Laterality: N/A;  . PERIPHERAL VASCULAR CATHETERIZATION Bilateral 04/10/2016   Procedure: Lower Extremity Angiography;  Surgeon: Elam Dutch, MD;  Location: [facility];  Service: Cardiovascular;  Laterality: Bilateral;  . SHOULDER ARTHROSCOPY WITH ROTATOR CUFF  REPAIR AND SUBACROMIAL DECOMPRESSION Right 08/30/2012   Procedure: RIGHT SHOULDER ARTHROSCOPY WITH SUBACROMIAL DECOMPRESSION, DISTAL CLAVICLE RESECTION AND ROTATOR CUFF REPAIR;  Surgeon: Cammie Sickle., MD;  Location: [facility];  Service: Orthopedics;  Laterality: Right;  . TONSILLECTOMY    . vocal cord surg  1992   removed precancerous polyps        Home Medications    Prior to Admission medications   Medication Sig Start Date End Date Taking? Authorizing Provider  acetaminophen (TYLENOL) 325 MG tablet Take 650 mg by mouth every 4 (four) hours as needed (pain and/or fever >100.5).    Yes [provider]  atorvastatin (LIPITOR) 40 MG tablet Take 40 mg by mouth at bedtime.  11/16/16  Yes [provider]  brimonidine (ALPHAGAN P) 0.1 % SOLN Place 1 drop into both eyes 3 (three) times daily. Wait 3-5 min. Between different eye drops   Yes [provider]  haloperidol (HALDOL) 2 MG tablet Take 2 mg by mouth at bedtime. For Agitation   Yes [provider]  haloperidol (HALDOL) 2 MG tablet Take 2 mg by mouth See admin instructions. Take 1 tablet by mouth every 4 hours as needed for agitation. Take 1 tablet by mouth every evening as needed (1-4 hours after scheduled dose if needed)   Yes [provider]  latanoprost (XALATAN) 0.005 % ophthalmic solution Place 1 drop into the left eye at bedtime. 02/27/16  Yes [provider]  lisinopril (PRINIVIL,ZESTRIL) 10 MG tablet Take 5 mg by mouth daily.   Yes [provider]  loperamide (ANTI-DIARRHEAL) 2 MG tablet Take 2-4 mg by mouth See admin instructions. Take 2 tablet by mouth after first unformed stool, take 1 tablet by mouth after each additional unformed stool *Do not exceed 16mg  in 24 hours*   Yes [provider]  memantine (NAMENDA) 10 MG tablet TAKE 1 TABLET BY MOUTH TWICE A DAY Patient taking differently: Take 10 mg by mouth 2 (two) times daily.  04/29/18  Yes Dennie Bible, NP  QUEtiapine (SEROQUEL) 25 MG tablet TAKE 1 TABLET BY MOUTH TWICE A DAY Patient taking differently: Take 25-50 mg by mouth See admin instructions. Take 1 tablet by mouth at 8am and Take 2 tablets by mouth at 5pm 05/19/18  Yes Dennie Bible, NP  senna-docusate (SENNA-PLUS) 8.6-50 MG tablet Take 2 tablets by mouth 2 (two) times daily as needed for mild constipation.   Yes [provider]  tamsulosin (FLOMAX) 0.4 MG CAPS capsule Take 1 capsule (0.4 mg total) by mouth daily. Patient not taking: Reported on 06/22/2018 04/12/18   Thurnell Lose, MD    Family History Family  History  Problem Relation Age of Onset  . Heart Problems Mother   . Other Father        mining accident  . Colon cancer Neg Hx     Social History Social History   Tobacco Use  . Smoking status: Former Smoker    Types: Cigarettes    Last attempt to quit: 03/08/1997    Years since quitting: 21.3  . Smokeless tobacco: Never Used  Substance Use Topics  . Alcohol use: No  . Drug use: No     Allergies   Codeine   Review of Systems Review of Systems  Unable to perform ROS: Dementia  Gastrointestinal: Negative for abdominal pain.     Physical Exam Updated Vital Signs BP 107/71   Pulse 73   Resp 11  Ht 5\' 6"  (1.676 m)   Wt 61 kg   SpO2 94%   BMI 21.71 kg/m   Physical Exam Vitals signs and nursing note reviewed.  Constitutional:      Appearance: He is well-developed.     Comments: Unresponsive  HENT:     Head: Normocephalic.     Comments: Pinpoint pupils    Nose: Nose normal.  Eyes:     General: No scleral icterus.    Conjunctiva/sclera: Conjunctivae normal.  Neck:     Musculoskeletal: Neck supple.     Thyroid: No thyromegaly.  Cardiovascular:     Rate and Rhythm: Normal rate and regular rhythm.     Heart sounds: No murmur. No friction rub. No gallop.   Pulmonary:     Breath sounds: No stridor. No wheezing or rales.  Chest:     Chest wall: No tenderness.  Abdominal:     General: There is no distension.     Tenderness: There is no abdominal tenderness. There is no rebound.  Musculoskeletal:     Comments: Patient does not move extremities to painful stimuli  Lymphadenopathy:     Cervical: No cervical adenopathy.  Skin:    Findings: No erythema or rash.  Neurological:     Motor: No abnormal muscle tone.     Coordination: Coordination normal.     Comments: Patient does not respond to verbal or painful stimuli.  Normal gag      ED Treatments / Results  Labs (all labs ordered are listed, but only abnormal results are displayed) Labs Reviewed    BASIC METABOLIC PANEL - Abnormal; Notable for the following components:      Result Value   Glucose, Bld 100 (*)    BUN 43 (*)    Creatinine, Ser 2.21 (*)    GFR calc non Af Amer 27 (*)    GFR calc Af Amer 32 (*)    All other components within normal limits  CBC WITH DIFFERENTIAL/PLATELET    EKG None  Radiology Ct Head Wo Contrast  Result Date: 07/15/2018 CLINICAL DATA:  Altered level of consciousness. Unresponsive. EXAM: CT HEAD WITHOUT CONTRAST TECHNIQUE: Contiguous axial images were obtained from the base of the skull through the vertex without intravenous contrast. COMPARISON:  06/22/2018 FINDINGS: Brain: Mild diffuse cerebral atrophy. No ventricular dilatation. Patchy low-attenuation changes in the deep white matter consistent with small vessel ischemia. No mass-effect or midline shift. No abnormal extra-axial fluid collections. Gray-white matter junctions are distinct. Basal cisterns are not effaced. Vascular: Moderate intracranial arterial calcifications. Skull: Calvarium appears intact. Sinuses/Orbits: Paranasal sinuses and mastoid air cells are clear. Other: No significant change. IMPRESSION: No acute intracranial abnormalities. Chronic atrophy and small vessel ischemic changes. Electronically Signed   By: Lucienne Capers M.D.   On: 07/15/2018 21:18   Dg Chest Port 1 View  Result Date: 07/15/2018 CLINICAL DATA:  Weakness EXAM: PORTABLE CHEST 1 VIEW COMPARISON:  06/22/2018 FINDINGS: Heart is normal size. Heavily calcified aorta with tortuosity. Bibasilar atelectasis or scarring. No effusions or acute bony abnormality. IMPRESSION: Bibasilar atelectasis or scarring, stable. Electronically Signed   By: Rolm Baptise M.D.   On: 07/15/2018 20:49    Procedures Procedures (including critical care time)  Medications Ordered in ED Medications  naloxone [facility]) injection 0.4 mg (0.4 mg Intravenous Given 07/15/18 2111)     Initial Impression / Assessment and Plan / ED Course  I have  reviewed the triage vital signs and the nursing notes.  Pertinent labs &  imaging results that were available during my care of the patient were reviewed by me and considered in my medical decision making (see chart for details).    CRITICAL CARE Performed by: Milton Ferguson Total critical care time:40 minutes Critical care time was exclusive of separately billable procedures and treating other patients. Critical care was necessary to treat or prevent imminent or life-threatening deterioration. Critical care was time spent personally by me on the following activities: development of treatment plan with patient and/or surrogate as well as nursing, discussions with consultants, evaluation of patient's response to treatment, examination of patient, obtaining history from patient or surrogate, ordering and performing treatments and interventions, ordering and review of laboratory studies, ordering and review of radiographic studies, pulse oximetry and re-evaluation of patient's condition.     Lab tests unremarkable CT scan of the head and cervical spine unremarkable.  I had a long discussion with the patient's wife and daughter.  They assured me this is his normal after taking Seroquel.  He has been doing this for the last 6 months.  He is a DNR.  They feel comfortable with him going back to the nursing home.  They are going to talk to the doctor about decreasing his Seroquel dose.  His wife will check him in the morning and make sure he does wake up like he normally does around 8:00.  Final Clinical Impressions(s) / ED Diagnoses   Final diagnoses:  Somnolence    ED Discharge Orders    None       Milton Ferguson, MD 07/15/18 2232    Milton Ferguson, MD 07/15/18 2233

## 2018-07-15 NOTE — ED Notes (Signed)
Report given to RN at [facility]. All questions answered

## 2018-07-15 NOTE — ED Triage Notes (Signed)
Per [facility], pt from [place] at [facility] memory care unit for a c/o AMS. It was reported that the pt was normal at dinner and then became altered after taking his Seroquel. Staff could not report what the pt's normal baseline mental status is. The pt normally takes his Seroquel after getting into bed but this time was in his recliner.

## 2018-07-21 IMAGING — CR DG CHEST 1V PORT
1 series · 1 of 1 positions shown · non-contrast
Comparison: 10/03/2007 report.

CLINICAL DATA: Laparotomy.

EXAM:
PORTABLE CHEST 1 VIEW

[portable]
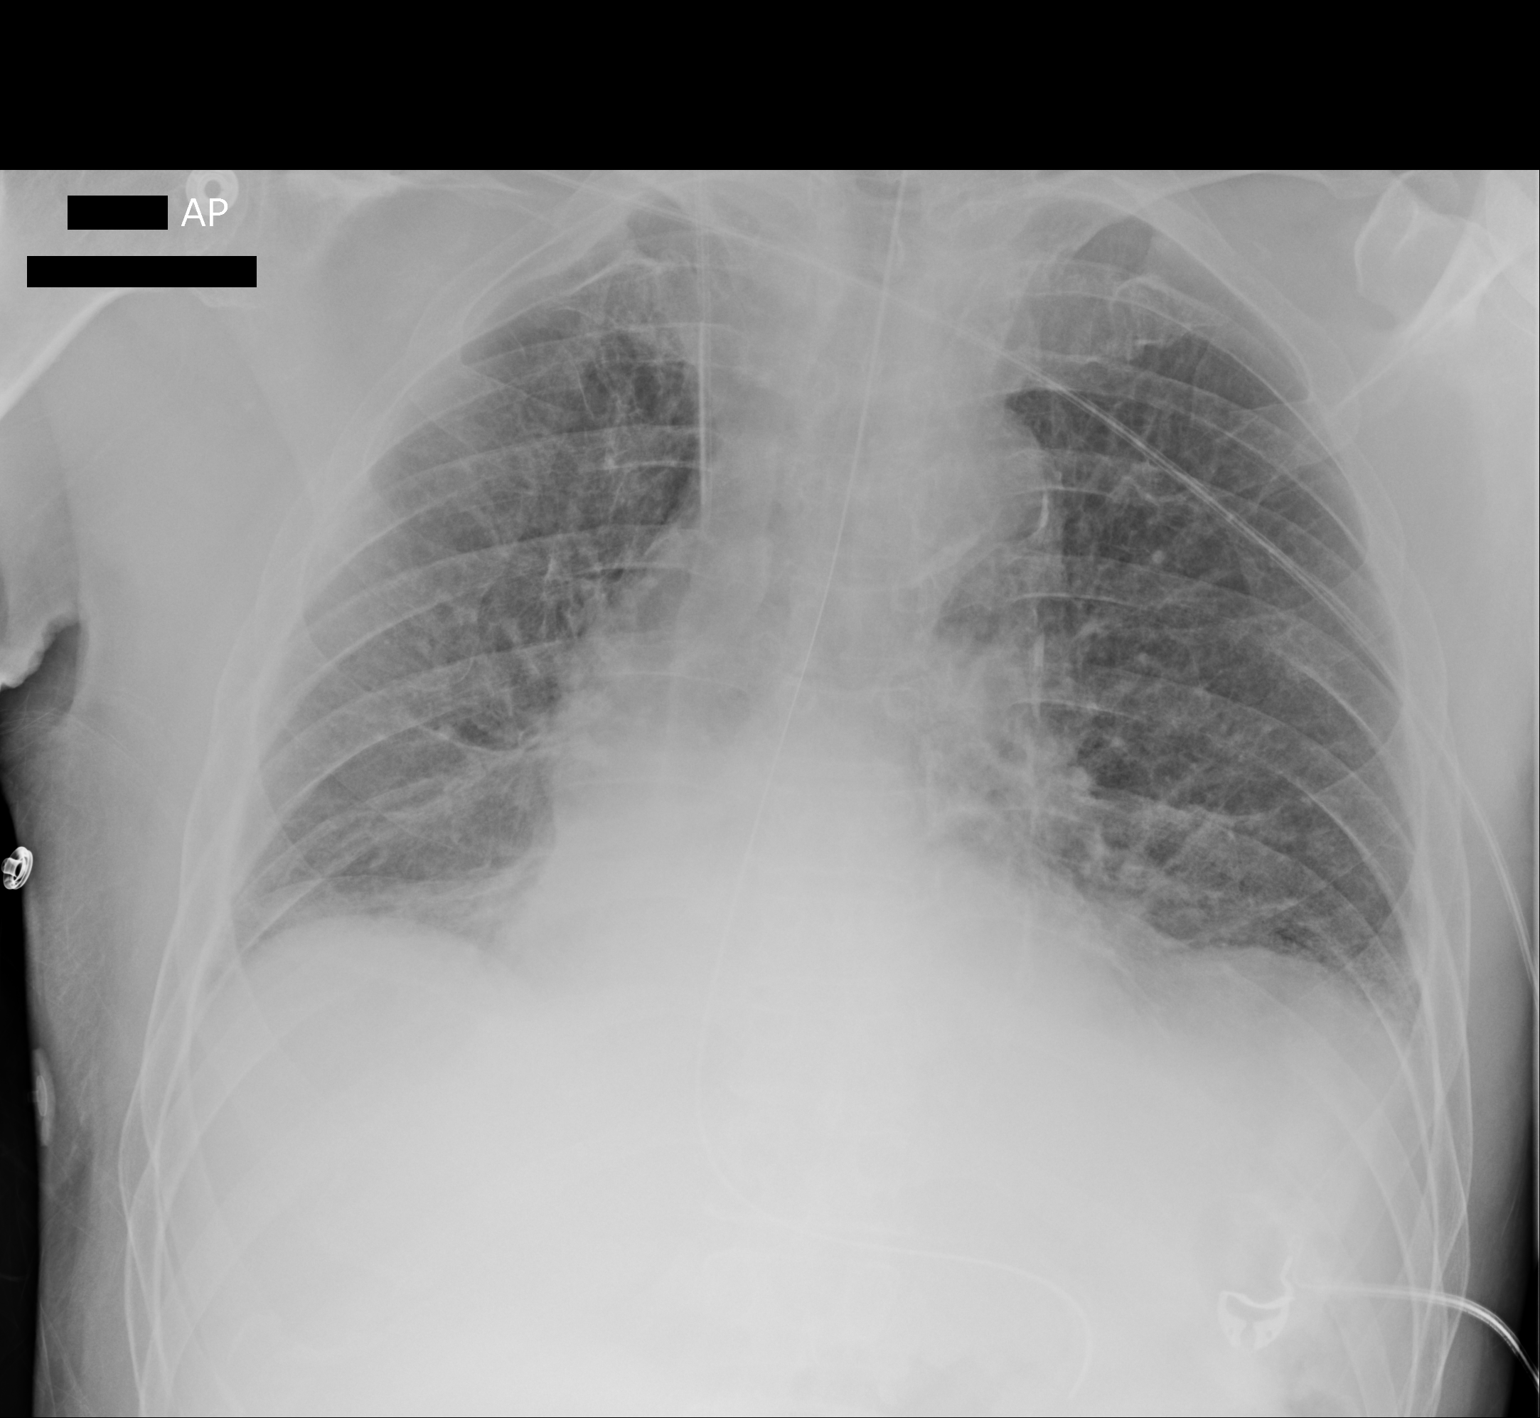

[1 of 1 positions shown; findings below may reference images not displayed]

FINDINGS: NG tube, right IJ line in good anatomic position. Cardiomegaly with
pulmonary vascular prominence and bilateral interstitial prominence.
No prominent pleural effusion or pneumothorax .
IMPRESSION: 1. Lines and tubes in good anatomic position.

2.  Congestive heart failure with mild interstitial edema .

3.  Low lung volumes.

## 2018-07-23 IMAGING — DX DG CHEST 1V
1 series · 1 of 1 positions shown · non-contrast
Comparison: 06/02/2016; 06/01/2016; chest CT - 10/13/2007

CLINICAL DATA: Confusion.  Shortness of breath.  History of CHF.

EXAM:
CHEST 1 VIEW

[x chest ap]
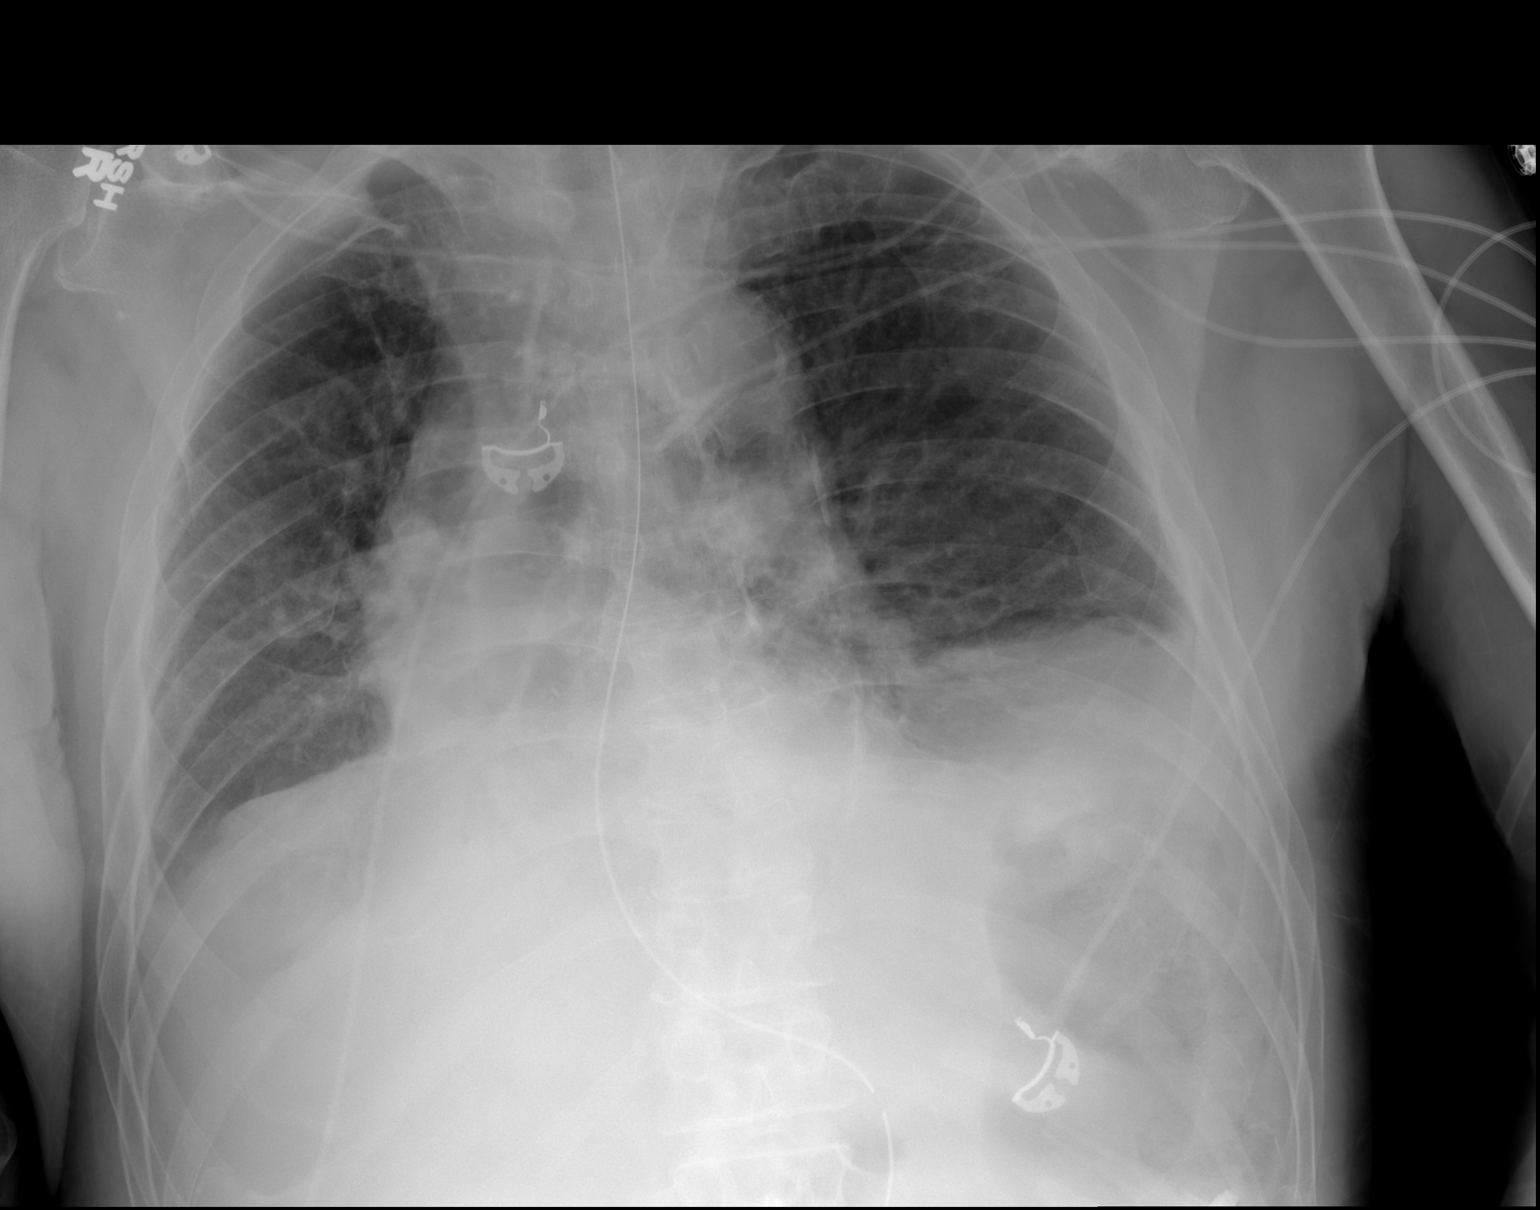

[1 of 1 positions shown; findings below may reference images not displayed]

FINDINGS: Grossly unchanged cardiac silhouette and mediastinal contours given
decreased lung volumes and patient rotation. Atherosclerotic plaque
within the thoracic aorta. Enteric tube tip and side port project
below the left hemi. Interval removal of right jugular approach
intravenous catheter. Pulmonary vasculature remains indistinct with
cephalization of flow. Unchanged small bilateral effusions with
associated bibasilar opacities, left greater than right. No
pneumothorax. Unchanged bones.
IMPRESSION: Similar findings of mild edema, small bilateral effusions and
associated bibasilar opacities, left greater than right, likely
atelectasis.

## 2018-07-25 DIAGNOSIS — G300 Alzheimer's disease with early onset: Secondary | ICD-10-CM | POA: Diagnosis not present

## 2018-07-25 DIAGNOSIS — R451 Restlessness and agitation: Secondary | ICD-10-CM | POA: Diagnosis not present

## 2018-07-25 DIAGNOSIS — I10 Essential (primary) hypertension: Secondary | ICD-10-CM | POA: Diagnosis not present

## 2018-07-25 DIAGNOSIS — I950 Idiopathic hypotension: Secondary | ICD-10-CM | POA: Diagnosis not present

## 2018-08-03 DIAGNOSIS — J302 Other seasonal allergic rhinitis: Secondary | ICD-10-CM | POA: Diagnosis not present

## 2018-08-03 DIAGNOSIS — I10 Essential (primary) hypertension: Secondary | ICD-10-CM | POA: Diagnosis not present

## 2018-08-03 DIAGNOSIS — G301 Alzheimer's disease with late onset: Secondary | ICD-10-CM | POA: Diagnosis not present

## 2018-08-17 DEATH — deceased

## 2018-08-22 ENCOUNTER — Telehealth: Payer: Self-pay | Admitting: Neurology

## 2018-08-22 NOTE — Telephone Encounter (Signed)
Pt's wife advised he passed on 2018-08-31.  FYI

## 2018-08-23 ENCOUNTER — Other Ambulatory Visit: Payer: Self-pay

## 2018-08-23 NOTE — Patient Outreach (Signed)
[facility]) Care Management  08/23/2018  Keith Gibson 1940/07/12 [id number]   Medication Adherence call to Mr. Keith Gibson spoke with patients wife patient deceased two weeks ago . [name] was showing past due under [place].   [facility] Direct Dial [phone]  Fax [phone] [email or web address]

## 2018-12-24 IMAGING — CR DG CHEST 2V
2 series · 2 of 2 positions shown · non-contrast
Comparison: 06/03/2016

CLINICAL DATA: Hypoxia

EXAM:
CHEST - 2 VIEW

[chest lat]
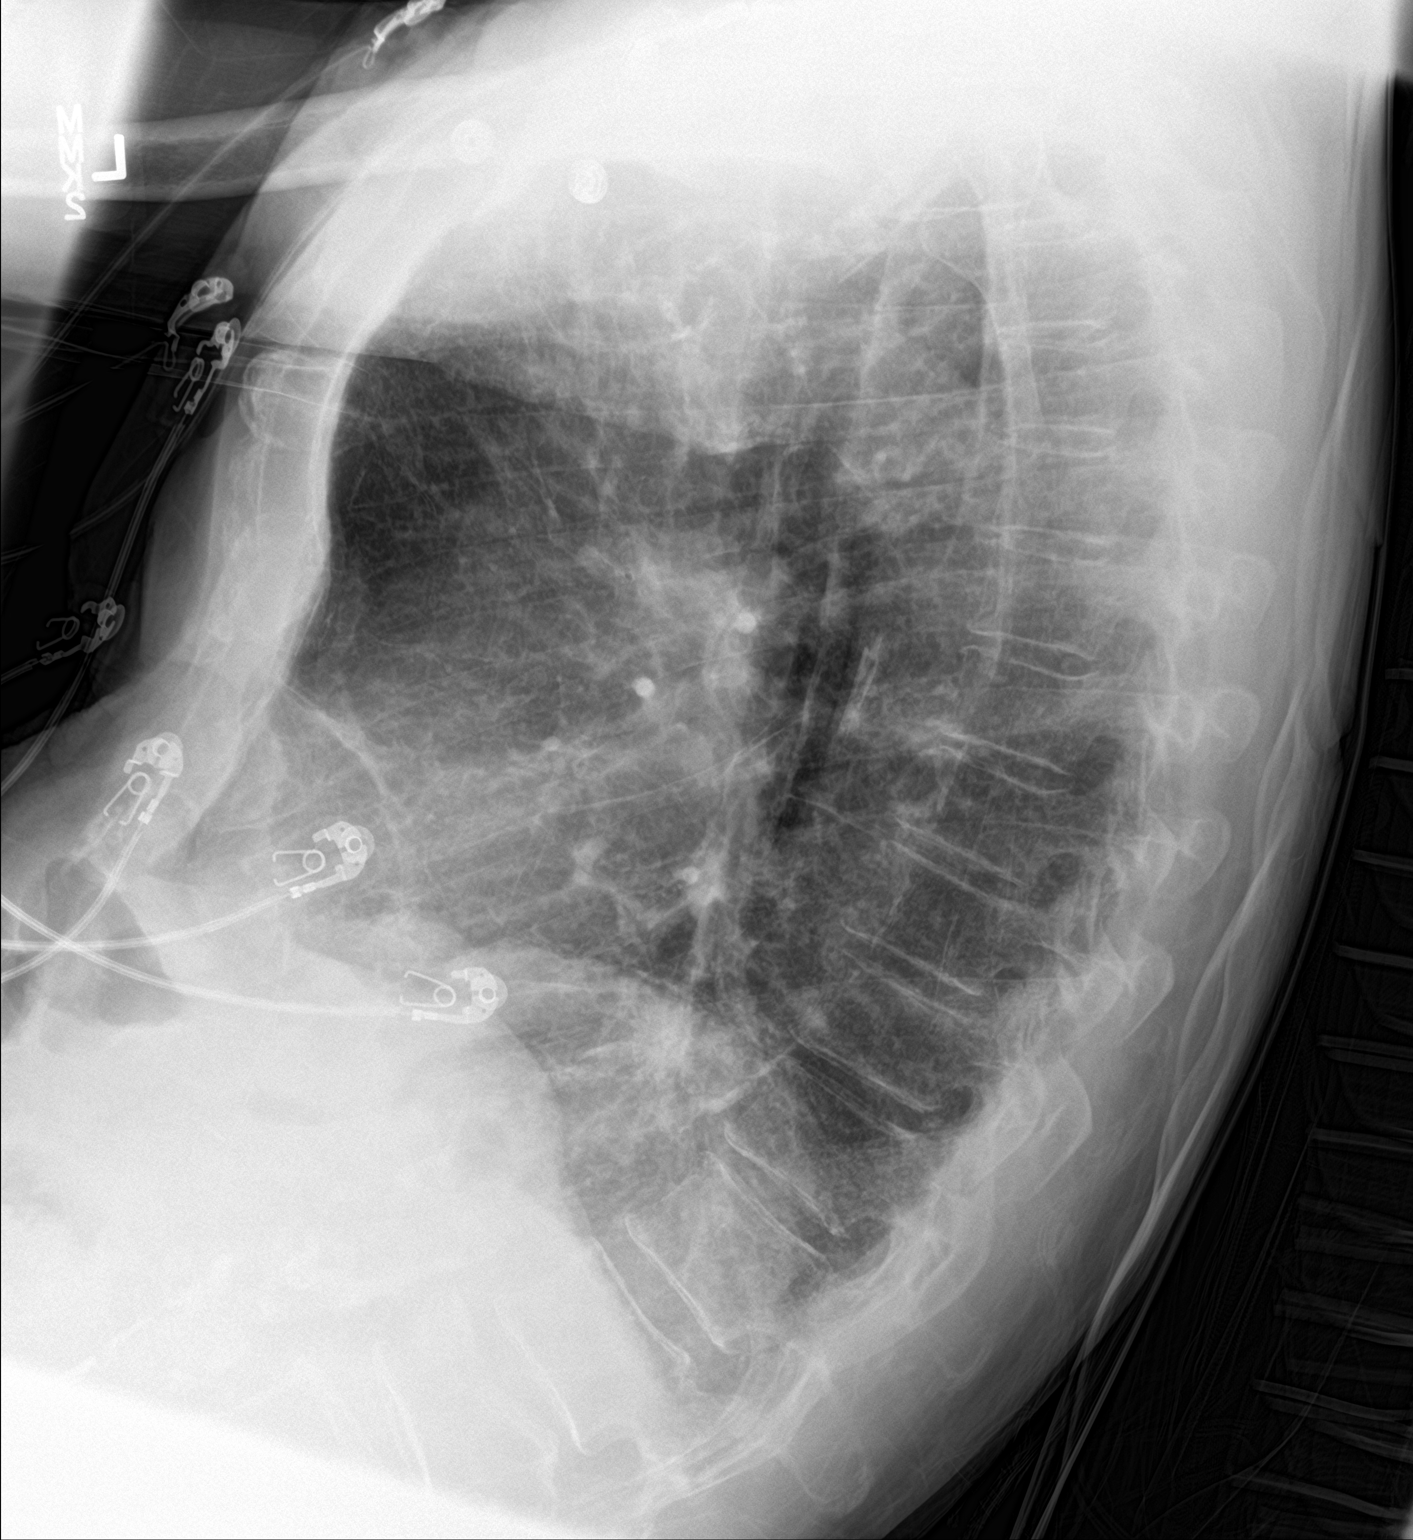

[chest ap]
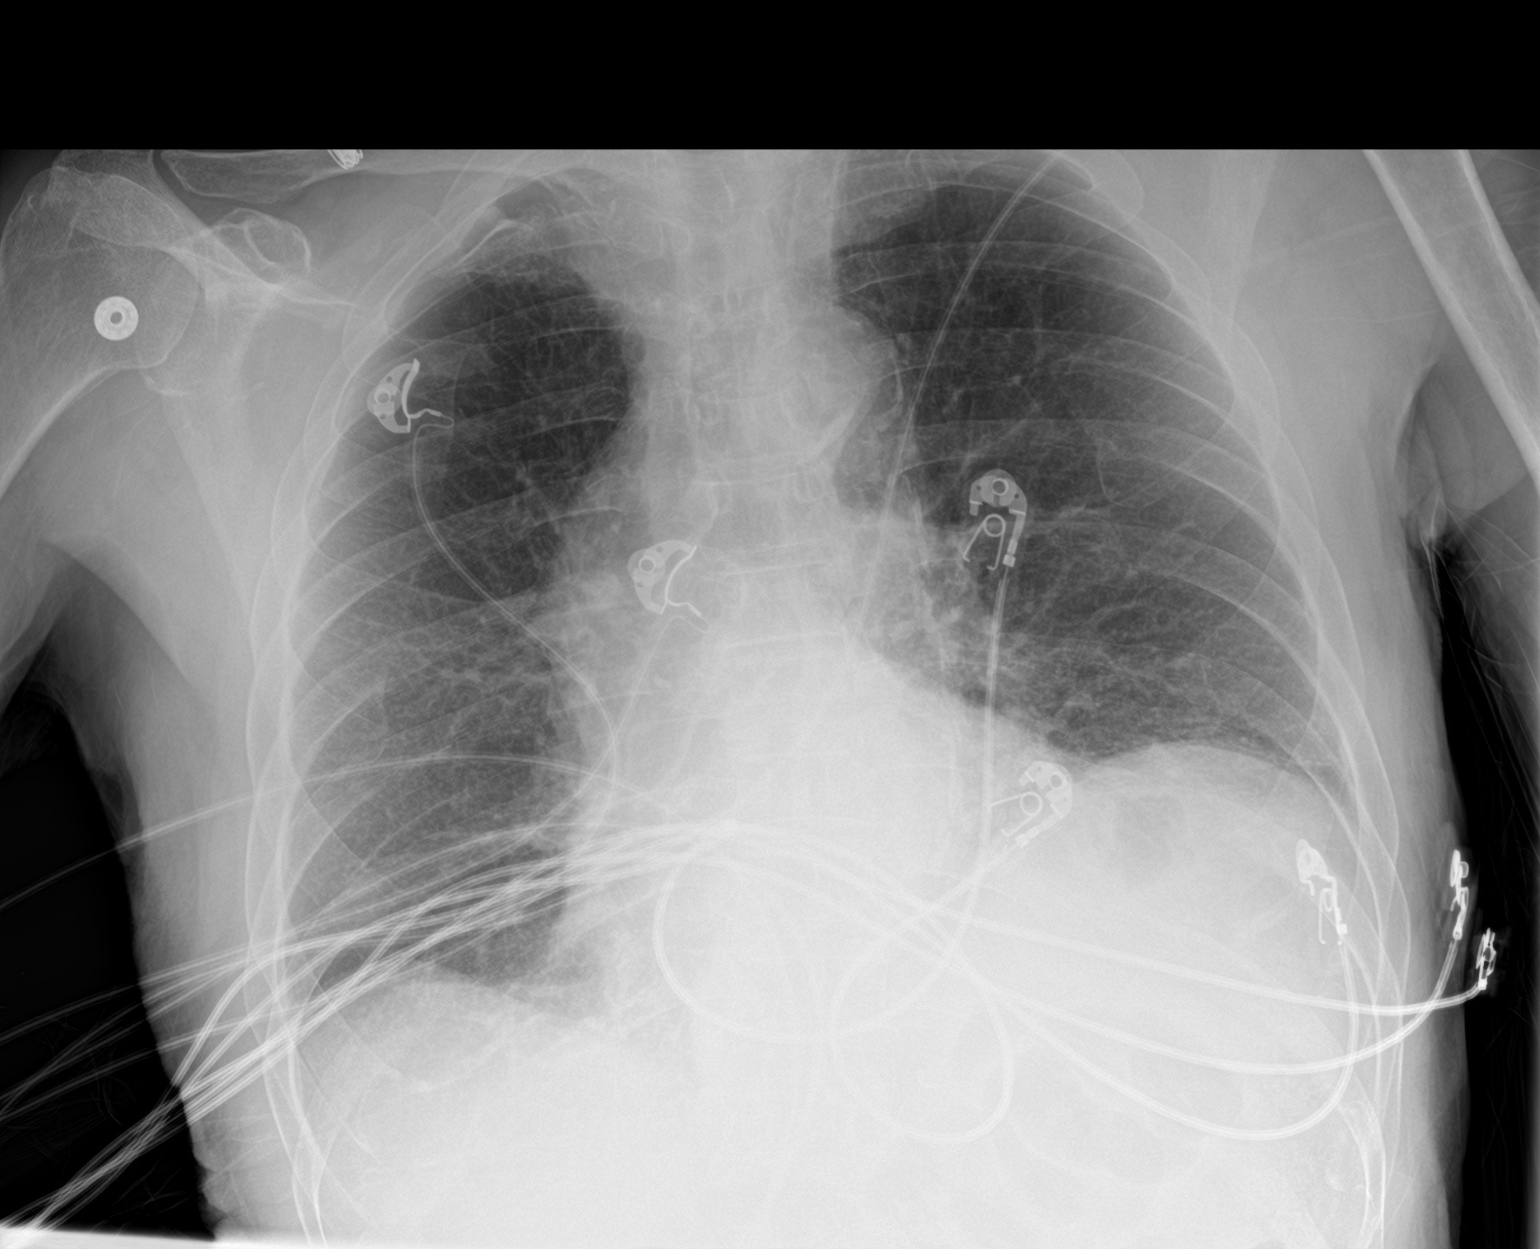

[2 of 2 positions shown; findings below may reference images not displayed]

FINDINGS: Normal heart size and vascularity. Low lung volumes with minor
basilar atelectasis. No focal pneumonia, collapse or consolidation.
Negative for significant edema, large effusion or pneumothorax.
Trachea is midline. Aorta atherosclerotic. Bones are osteopenic with
degenerative changes noted of the spine.
IMPRESSION: Low volume exam with basilar atelectasis.  No other acute process.

Atherosclerosis

## 2020-01-14 IMAGING — US US RENAL
1 series · 14 of 25 positions shown · non-contrast
Comparison: None.

CLINICAL DATA: Renal failure

EXAM:
RENAL / URINARY TRACT ULTRASOUND COMPLETE

[Series 1: us renal · 0.22mm/px · 14 of 26 slices shown]
[im 1/26]
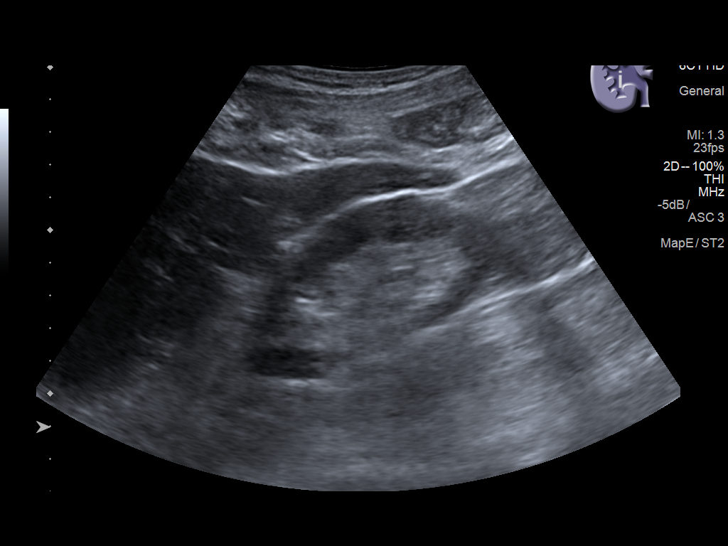
[im 3/26]
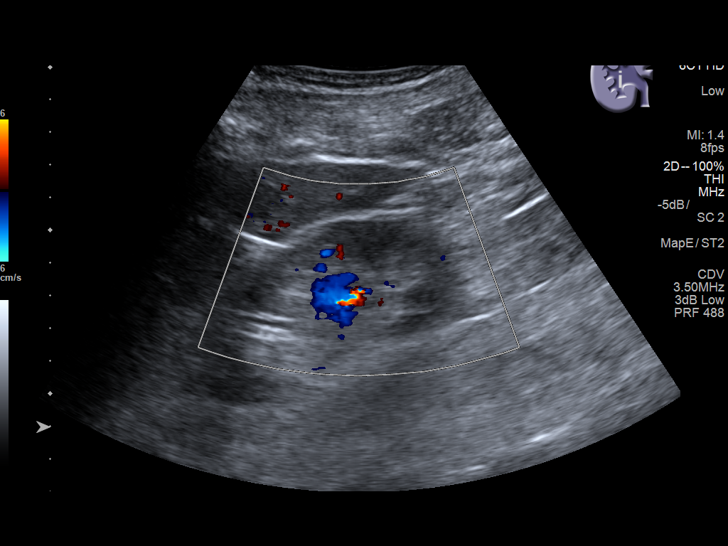
[im 5/26]
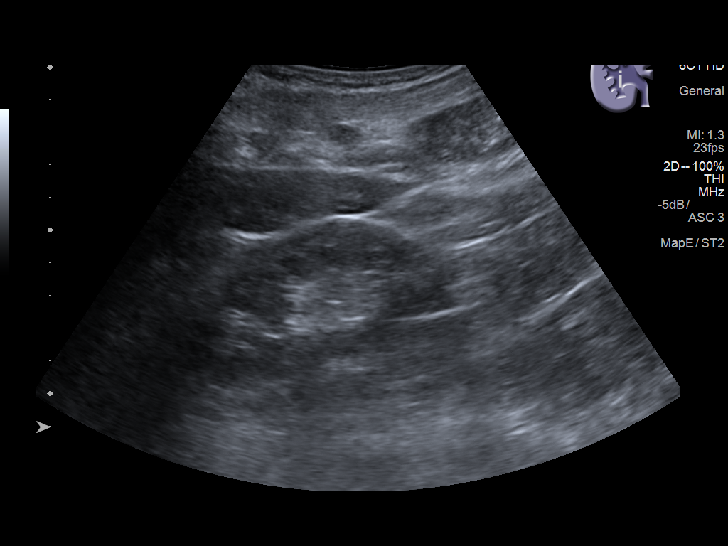
[im 7/26]
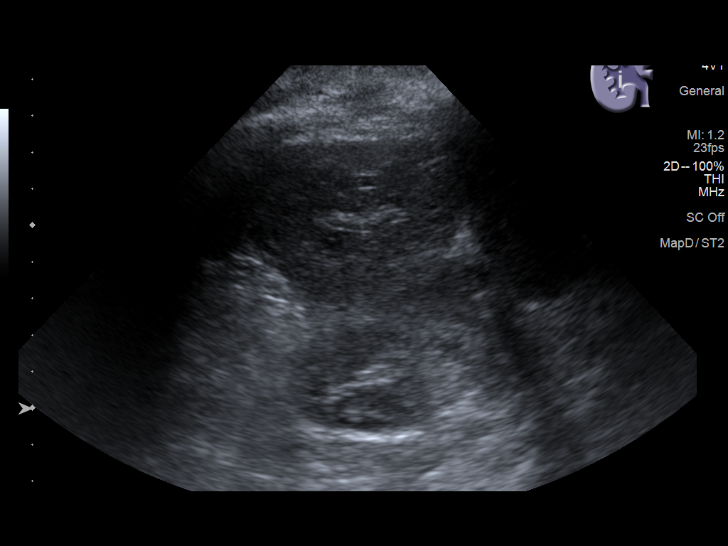
[im 9/26]
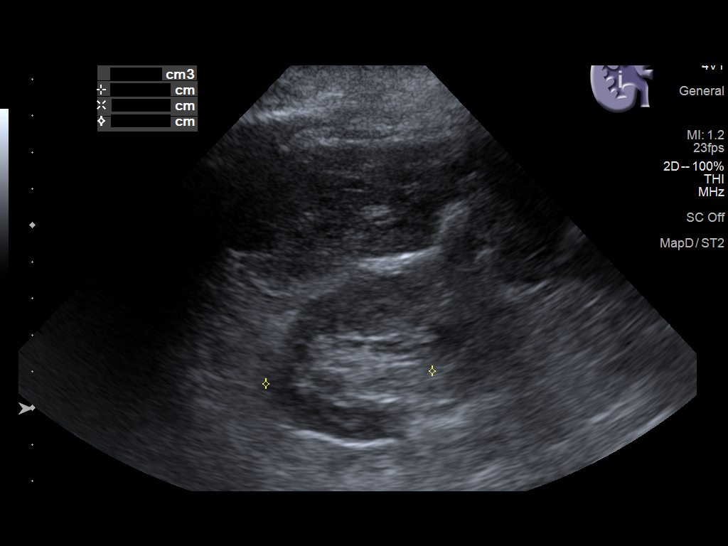
[im 10/26]
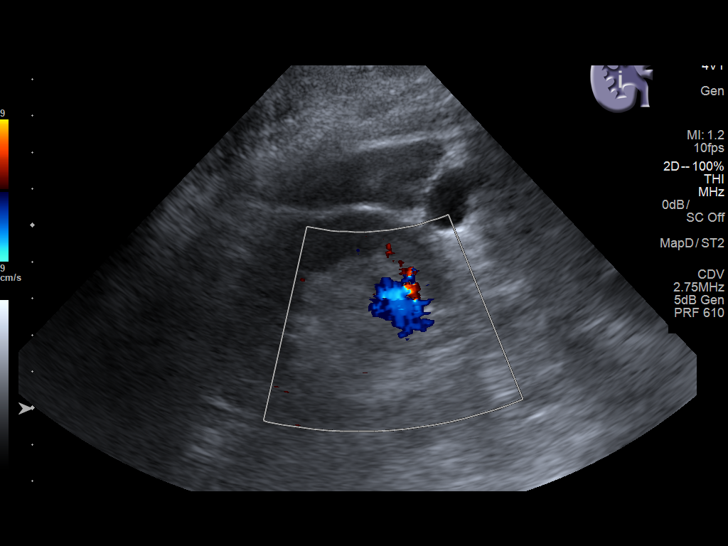
[im 12/26]
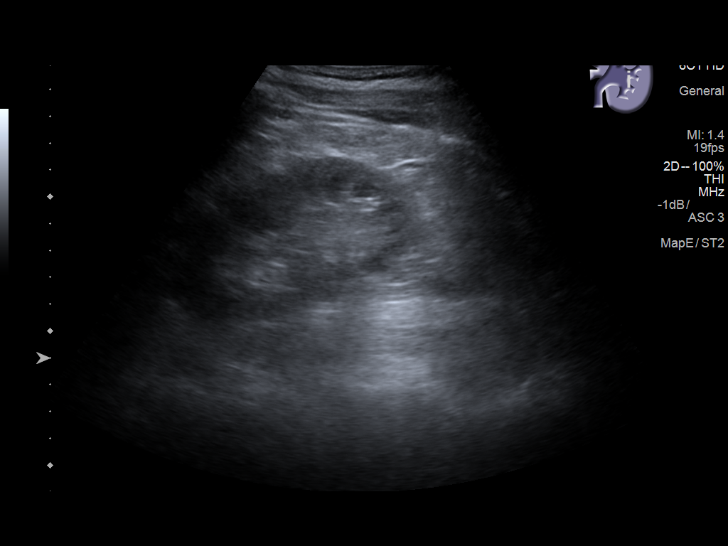
[im 14/26]
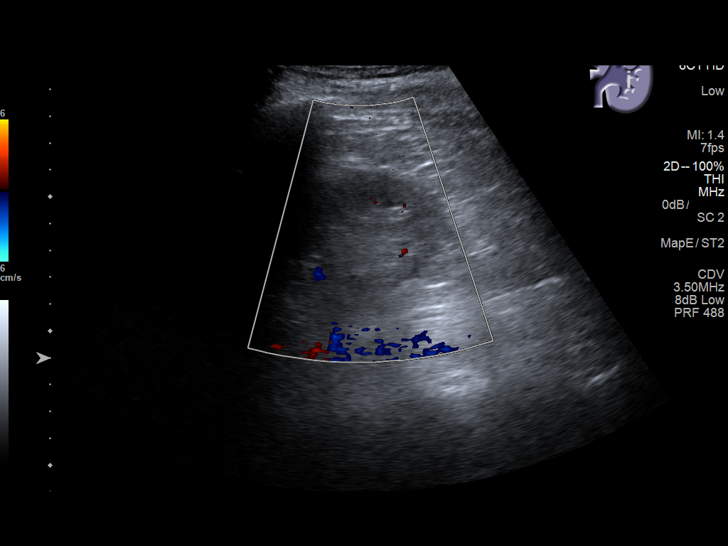
[im 16/26]
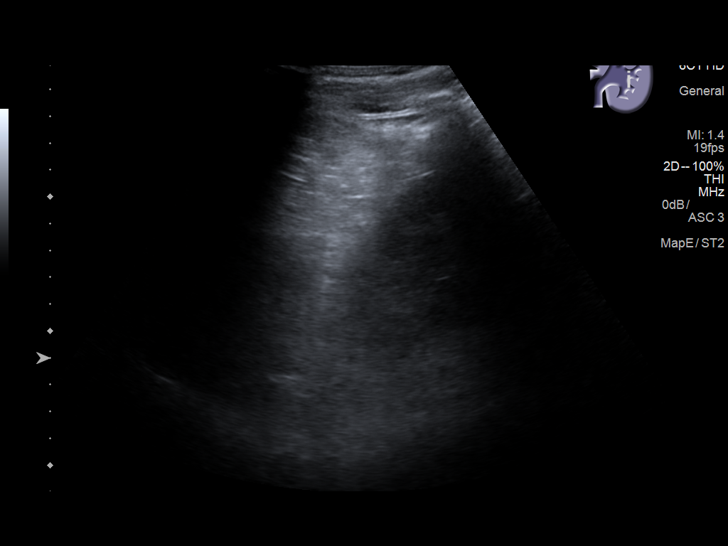
[im 17/26]
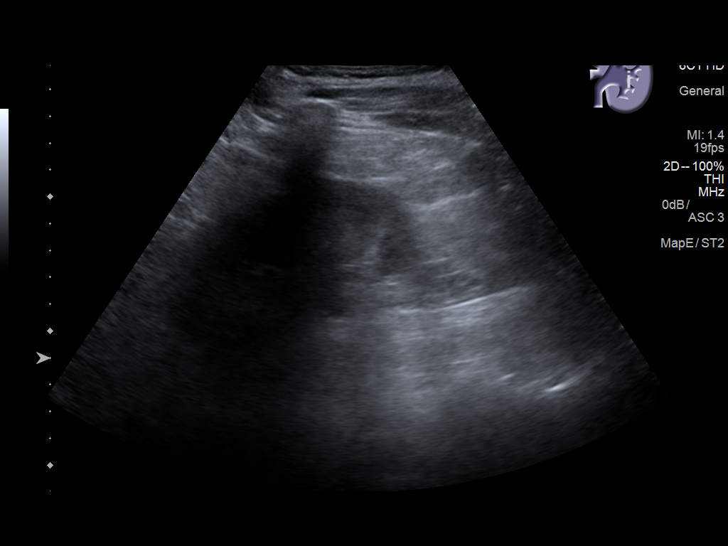
[im 19/26]
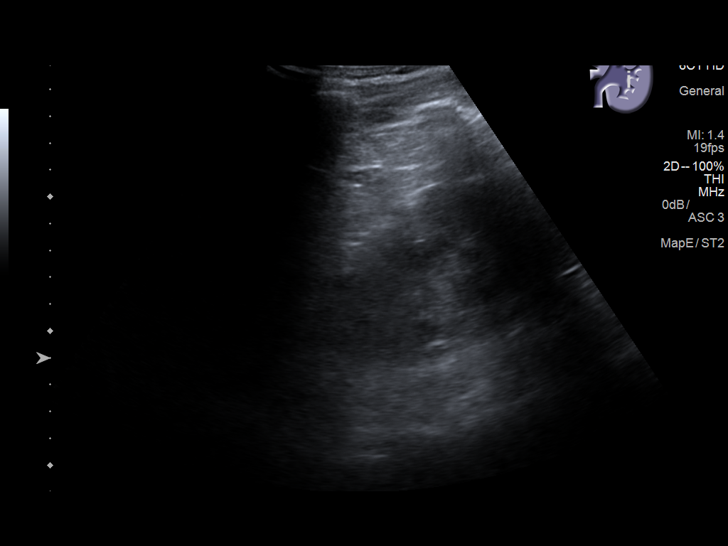
[im 21/26]
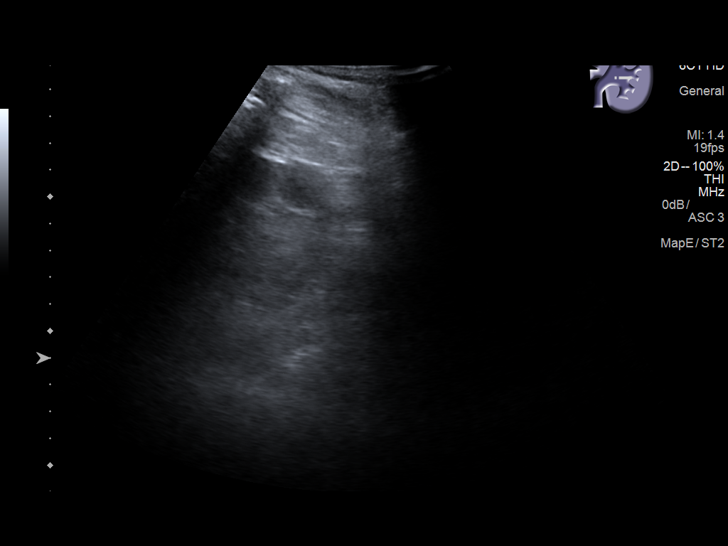
[im 23/26]
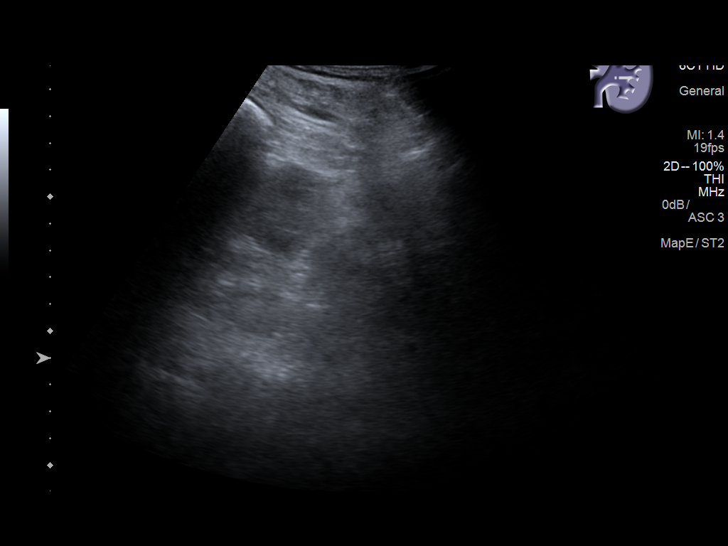
[im 26/26]
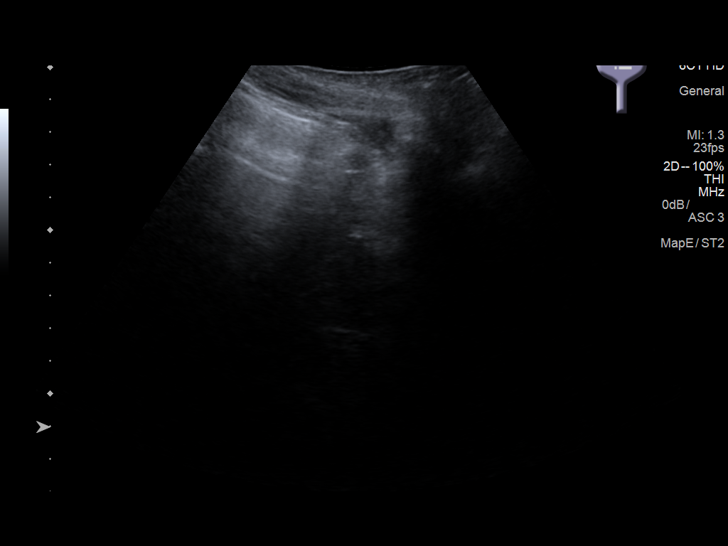

[14 of 25 positions shown; findings below may reference images not displayed]

FINDINGS: Right Kidney:

Renal measurements: 8.9 x 4.3 x 4.6 cm = volume: 90.2 mL. Contains a
1.4 x 1.0 x 1.0 cm hypoechoic mass, favored to represent a cyst but
not completely characterize. A cyst was not seen in the right kidney
on the June 11, 2016 comparison CT the abdomen and pelvis.

Left Kidney:

Renal measurements: 10 x 4.8 x 5.4 cm = volume: 135.7 mL. The cyst
seen off the upper pole the left kidney on the comparison CT scan is
not visualized.

Bladder:

Not visualized due to decompression.
IMPRESSION: 1. Cortical thinning bilaterally suggesting medical renal disease.
2. The 1.4 cm mass in the right kidney is favored to represent a
cyst but is not determinant on this study and was not seen on the
May 2016 CT scan. Recommend an MRI for more definitive
characterization.
3. No other abnormalities.  No hydronephrosis.
